# Patient Record
Sex: Male | Born: 1955
Health system: Southern US, Community
[De-identification: ages and names within clinical notes are randomized; demographics above are authoritative.]

## PROBLEM LIST (undated history)

## (undated) DIAGNOSIS — Z87442 Personal history of urinary calculi: Secondary | ICD-10-CM

## (undated) DIAGNOSIS — N200 Calculus of kidney: Secondary | ICD-10-CM

## (undated) DIAGNOSIS — K219 Gastro-esophageal reflux disease without esophagitis: Secondary | ICD-10-CM

## (undated) DIAGNOSIS — H43819 Vitreous degeneration, unspecified eye: Secondary | ICD-10-CM

## (undated) DIAGNOSIS — R002 Palpitations: Secondary | ICD-10-CM

## (undated) DIAGNOSIS — C4491 Basal cell carcinoma of skin, unspecified: Secondary | ICD-10-CM

## (undated) DIAGNOSIS — K635 Polyp of colon: Secondary | ICD-10-CM

## (undated) HISTORY — DX: Basal cell carcinoma of skin, unspecified: C44.91

## (undated) HISTORY — DX: Polyp of colon: K63.5

## (undated) HISTORY — PX: POLYPECTOMY: SHX149

## (undated) HISTORY — DX: Palpitations: R00.2

## (undated) HISTORY — DX: Calculus of kidney: N20.0

## (undated) HISTORY — DX: Vitreous degeneration, unspecified eye: H43.819

---
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
## 2000-10-03 HISTORY — PX: KNEE ARTHROSCOPY: SUR90

## 2006-11-24 ENCOUNTER — Encounter: Payer: Self-pay | Admitting: Internal Medicine

## 2006-12-12 ENCOUNTER — Encounter: Payer: Self-pay | Admitting: Internal Medicine

## 2006-12-25 ENCOUNTER — Encounter: Payer: Self-pay | Admitting: Internal Medicine

## 2006-12-29 ENCOUNTER — Encounter: Payer: Self-pay | Admitting: Internal Medicine

## 2007-01-02 ENCOUNTER — Encounter: Payer: Self-pay | Admitting: Internal Medicine

## 2008-04-22 ENCOUNTER — Encounter: Payer: Self-pay | Admitting: Internal Medicine

## 2008-04-22 LAB — CONVERTED CEMR LAB
Albumin: 4.5 g/dL
CO2, serum: 26 mmol/L
Calcium: 9.6 mg/dL
GGT: 23 units/L
Globulin: 2.2 g/dL
LDL Cholesterol: 105 mg/dL
PSA: 0.9 ng/mL
Platelets: 225 10*3/uL
Sodium, serum: 140 mmol/L
Total CK: 3 units/L
Total Protein: 6.7 g/dL
Triglycerides: 54 mg/dL
WBC, blood: 5.1 10*3/uL

## 2008-04-25 ENCOUNTER — Encounter: Payer: Self-pay | Admitting: Internal Medicine

## 2008-05-07 ENCOUNTER — Encounter: Payer: Self-pay | Admitting: Internal Medicine

## 2008-05-07 ENCOUNTER — Encounter: Payer: Self-pay | Admitting: Gastroenterology

## 2008-09-10 ENCOUNTER — Encounter: Payer: Self-pay | Admitting: Internal Medicine

## 2008-09-10 LAB — CONVERTED CEMR LAB: Homocysteine: 9.5 micromoles/L

## 2009-01-29 ENCOUNTER — Emergency Department (HOSPITAL_BASED_OUTPATIENT_CLINIC_OR_DEPARTMENT_OTHER): Admission: EM | Admit: 2009-01-29 | Discharge: 2009-01-29 | Payer: Self-pay | Admitting: Emergency Medicine

## 2009-01-29 ENCOUNTER — Encounter: Payer: Self-pay | Admitting: Internal Medicine

## 2009-01-29 ENCOUNTER — Ambulatory Visit: Payer: Self-pay | Admitting: Diagnostic Radiology

## 2009-01-29 LAB — CONVERTED CEMR LAB
BUN: 19 mg/dL
Calcium: 9.1 mg/dL
Hemoglobin: 14.4 g/dL
Platelets: 210 10*3/uL
RBC count: 4.56 10*6/uL
Sodium, serum: 142 mmol/L
WBC, blood: 8.7 10*3/uL

## 2009-02-17 ENCOUNTER — Ambulatory Visit: Payer: Self-pay | Admitting: Internal Medicine

## 2009-02-17 DIAGNOSIS — F411 Generalized anxiety disorder: Secondary | ICD-10-CM | POA: Insufficient documentation

## 2009-02-17 DIAGNOSIS — R197 Diarrhea, unspecified: Secondary | ICD-10-CM | POA: Insufficient documentation

## 2009-02-21 ENCOUNTER — Encounter: Payer: Self-pay | Admitting: Internal Medicine

## 2009-04-07 ENCOUNTER — Ambulatory Visit: Payer: Self-pay | Admitting: Gastroenterology

## 2009-04-16 ENCOUNTER — Encounter: Payer: Self-pay | Admitting: Internal Medicine

## 2009-04-22 ENCOUNTER — Ambulatory Visit: Payer: Self-pay | Admitting: Gastroenterology

## 2009-04-24 LAB — CONVERTED CEMR LAB: Tissue Transglutaminase Ab, IgA: 0.3 units (ref <7)

## 2009-04-26 ENCOUNTER — Telehealth: Payer: Self-pay | Admitting: Internal Medicine

## 2009-05-19 ENCOUNTER — Ambulatory Visit: Payer: Self-pay | Admitting: Gastroenterology

## 2009-05-25 ENCOUNTER — Ambulatory Visit: Payer: Self-pay | Admitting: Cardiovascular Disease

## 2009-06-18 ENCOUNTER — Telehealth (INDEPENDENT_AMBULATORY_CARE_PROVIDER_SITE_OTHER): Payer: Self-pay | Admitting: *Deleted

## 2010-04-09 ENCOUNTER — Ambulatory Visit: Payer: Self-pay | Admitting: Internal Medicine

## 2010-04-09 DIAGNOSIS — R634 Abnormal weight loss: Secondary | ICD-10-CM | POA: Insufficient documentation

## 2010-04-09 LAB — CONVERTED CEMR LAB: Blood Glucose, Fingerstick: 103

## 2010-04-15 ENCOUNTER — Ambulatory Visit: Payer: Self-pay | Admitting: Internal Medicine

## 2010-05-03 DIAGNOSIS — H43819 Vitreous degeneration, unspecified eye: Secondary | ICD-10-CM

## 2010-05-03 HISTORY — DX: Vitreous degeneration, unspecified eye: H43.819

## 2010-05-03 LAB — CONVERTED CEMR LAB
ALT: 18 units/L (ref 0–53)
AST: 20 units/L (ref 0–37)
BUN: 18 mg/dL (ref 6–23)
Basophils Absolute: 0 10*3/uL (ref 0.0–0.1)
Basophils Relative: 0.8 % (ref 0.0–3.0)
Chloride: 104 meq/L (ref 96–112)
Folate: 18.7 ng/mL
Glucose, Bld: 74 mg/dL (ref 70–99)
Hemoglobin: 14.9 g/dL (ref 13.0–17.0)
Lymphocytes Relative: 34.3 % (ref 12.0–46.0)
Monocytes Relative: 8.2 % (ref 3.0–12.0)
Neutro Abs: 3.3 10*3/uL (ref 1.4–7.7)
Potassium: 4.5 meq/L (ref 3.5–5.1)
RBC: 4.67 M/uL (ref 4.22–5.81)

## 2010-05-04 ENCOUNTER — Ambulatory Visit: Payer: Self-pay | Admitting: Internal Medicine

## 2010-05-10 LAB — CONVERTED CEMR LAB: PSA: 0.99 ng/mL (ref 0.10–4.00)

## 2010-05-23 IMAGING — CR DG CHEST 2V
2 series · 2 of 2 positions shown · non-contrast
Comparison: None

CLINICAL DATA: Palpitations, dizziness

CHEST - 2 VIEW

[w chest pa]
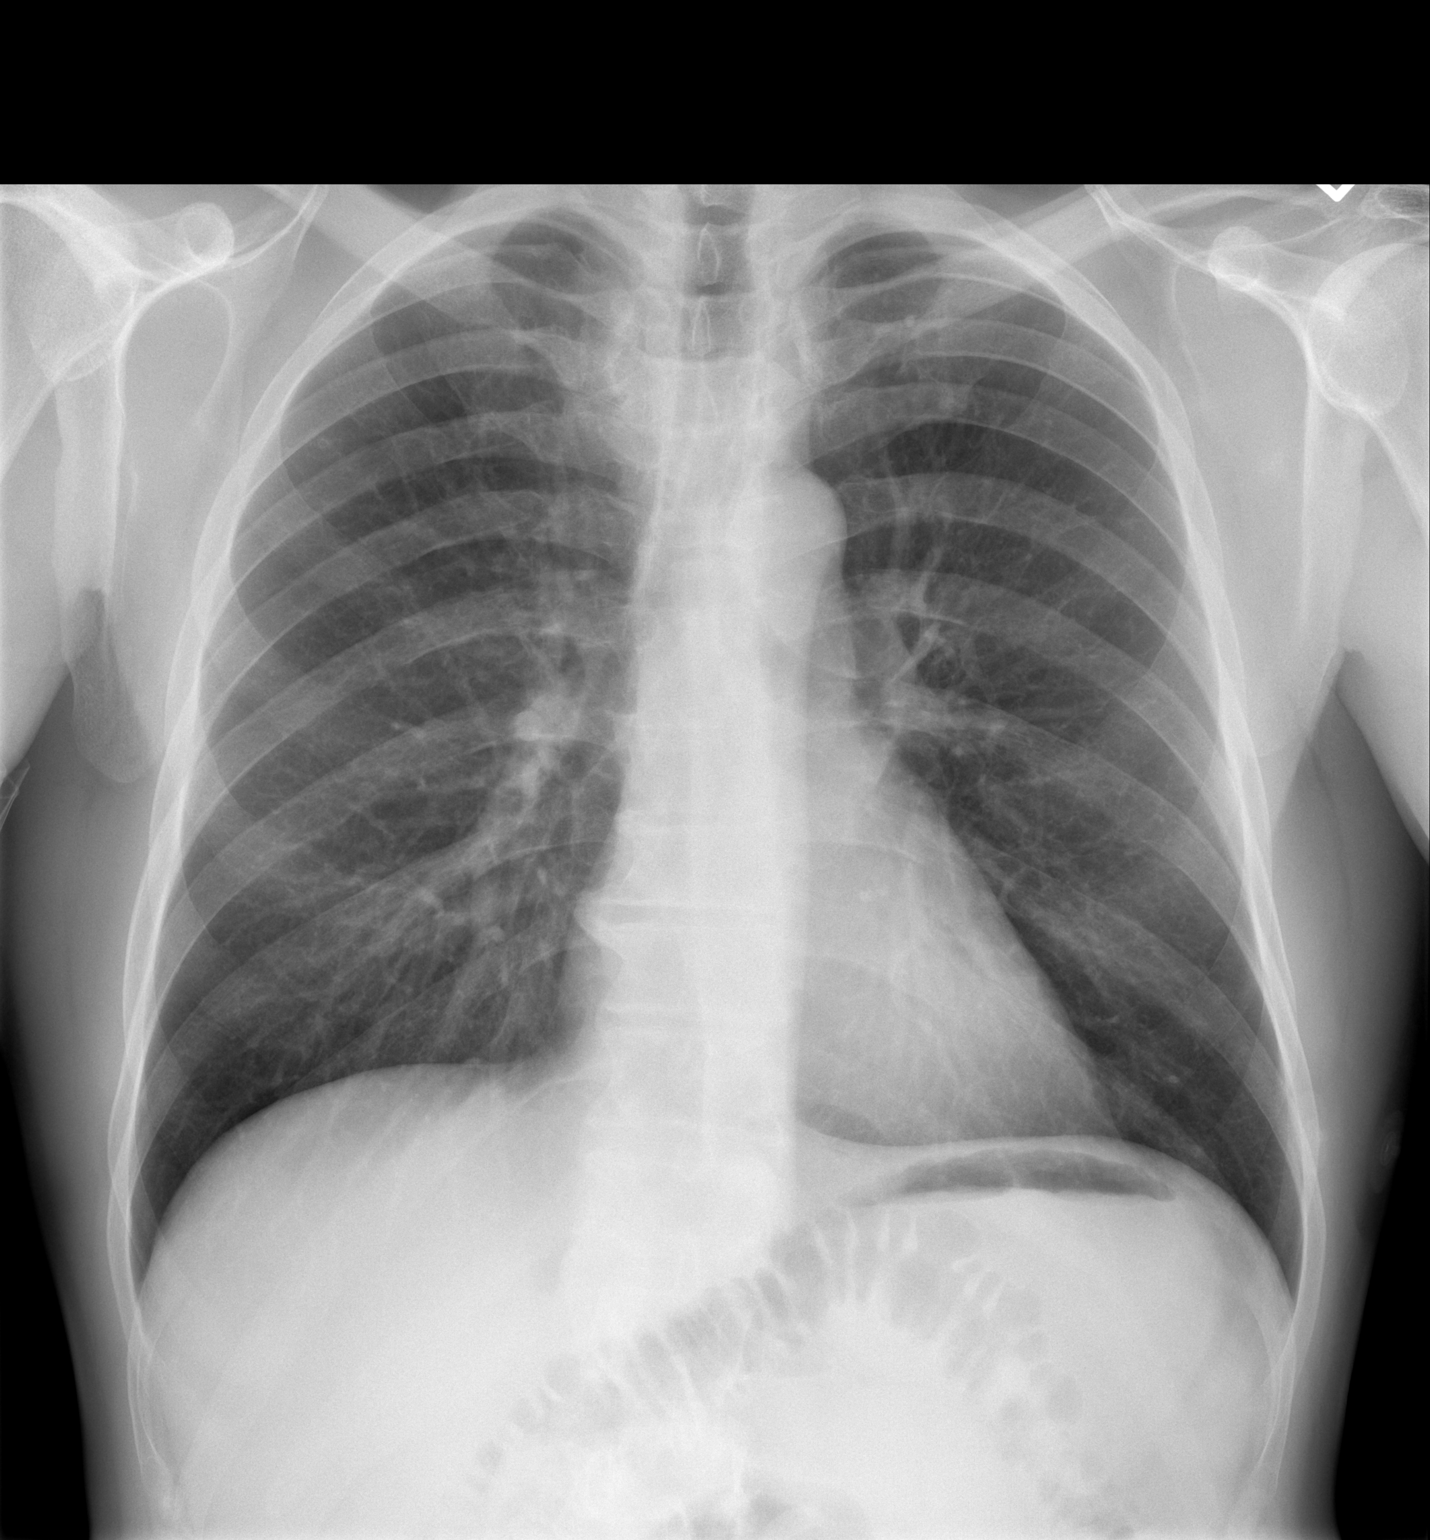

[w chest lat]
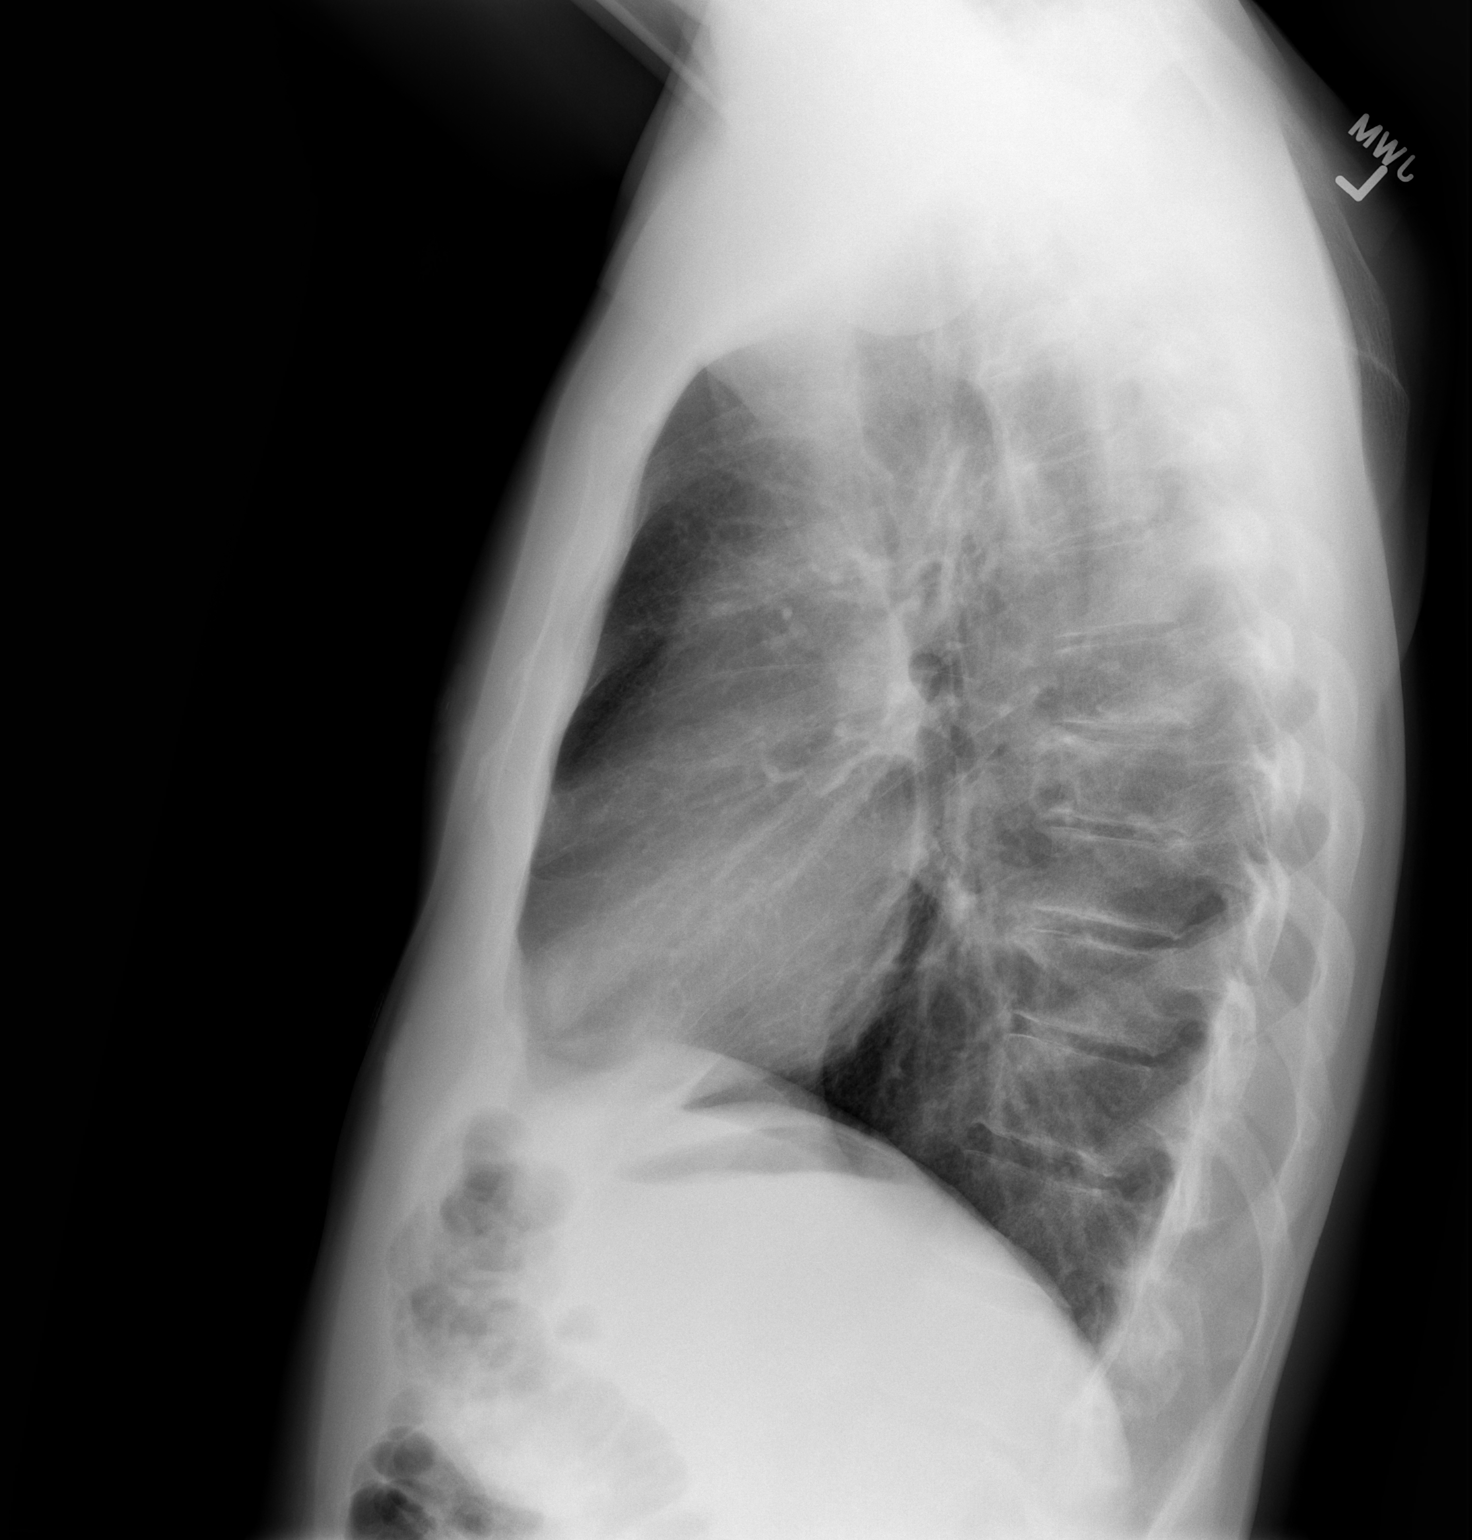

[2 of 2 positions shown; findings below may reference images not displayed]

FINDINGS: Normal heart size, mediastinal contours, and pulmonary vascularity.
Lungs clear.
Bones unremarkable.
No pneumothorax.
IMPRESSION: No acute abnormalities.

## 2010-08-23 ENCOUNTER — Ambulatory Visit: Payer: Self-pay | Admitting: Internal Medicine

## 2010-08-31 ENCOUNTER — Ambulatory Visit: Payer: Self-pay | Admitting: Internal Medicine

## 2010-09-03 LAB — CONVERTED CEMR LAB: HDL: 66.7 mg/dL (ref >39.00)

## 2010-11-02 NOTE — Assessment & Plan Note (Signed)
Summary: CPX//PH   Vital Signs:  Patient profile:   55 year old male Height:      70 inches Weight:      159.13 pounds Pulse rate:   69 / minute Pulse rhythm:   regular BP sitting:   124 / 76  (left arm) Cuff size:   regular  Vitals Entered By: Army Fossa CMA (August 23, 2010 2:21 PM) CC: CPX, not fasting  Comments no complaints [facility]   History of Present Illness: complete physical exam  Preventive Screening-Counseling & Management  Alcohol-Tobacco     Alcohol type: occasionally     Smoking Status: never  Current Medications (verified): 1)  Lamisil 250 Mg Tabs (Terbinafine Hcl) .... Per Podiatrist  Allergies (verified): No Known Drug Allergies  Past History:  Past Medical History: Palpitations, 2010, saw [facility] Cardiology (w/u per patient negative) Anxiety   Vitreous detachment, dx  ~ 05-2010   Past Surgical History: Reviewed history from 04/07/2009 and no changes required. 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 CV:  Denies chest pain or discomfort, palpitations, and swelling of feet. Resp:  Denies cough and shortness of breath. GI:  Denies bloody stools, nausea, and vomiting; still loose stools on-off . GU:  Denies dysuria, hematuria, urinary frequency, and urinary hesitancy.  Physical Exam  General:  alert, well-developed, and well-nourished.   Neck:  no masses and no thyromegaly.   Lungs:  normal respiratory effort, no intercostal retractions, no accessory muscle use, and normal breath sounds.   Heart:  normal rate, regular  rhythm, no murmur, and no gallop.   Abdomen:  soft, non-tender, no distention, no masses, no guarding, and no rigidity.   Rectal:  No external abnormalities noted. Normal sphincter tone. No rectal masses or tenderness. Prostate:  Prostate gland firm and smooth, no nodularity, tenderness, mass, asymmetry or induration.  prostate is slightly  enlarged  Extremities:  no pretibial edema bilaterally  Psych:  Oriented X3, memory intact for recent and remote, normally interactive, good eye contact, not anxious appearing, and not depressed appearing.     Impression & Recommendations:  Problem # 1:  ROUTINE GENERAL MEDICAL EXAM@HEALTH  CARE FACL (ICD-V70.0) Td 06 Had a flu shot already   [place] (random biopsies were normal, terminal ileum was normal appearing and normal by biopsy, 10mm pedunculated TVA was removed from rectum). recall colonoscopy 05/2011  doing great, continue his healthy life style labs, see instructions   Complete Medication List: 1)  Lamisil 250 Mg Tabs (Terbinafine hcl) .... Per podiatrist  Patient Instructions: 1)  please came back fasting for a FLP, dx V70 2)  Please schedule a follow-up appointment in 1 year.    Orders Added: 1)  Est. Patient age 85-64 [[id number]]   Immunization History:  Influenza Immunization History:    Influenza:  historical (07/23/2010)   Immunization History:  Influenza Immunization History:    Influenza:  Historical (07/23/2010)    Risk Factors:  Tobacco use:  never Alcohol use:  yes    Type:  occasionally

## 2010-11-02 NOTE — Assessment & Plan Note (Signed)
Summary: QUESTION ABOUT WEIGHT LOSS/KN--Rm  11   Vital Signs:  Patient profile:   55 year old male Height:      70 inches Weight:      156.25 pounds BMI:     22.50 Temp:     97.2 degrees F oral Pulse rate:   66 / minute Pulse rhythm:   regular Resp:     12 per minute BP sitting:   110 / 82  (left arm) Cuff size:   regular  Vitals Entered By: Mervin Kung CMA Duncan Dull) (April 09, 2010 4:11 PM) CC: Room 11   Pt concerned about losing weight without trying to. Has lost 10 pounds over 1 year. Wants PSA checked.  Intermittent cloudy vision x 6 months. Is Patient Diabetic? Yes CBG Result 103  Comments Pt no longer takes Immodium.   History of Present Illness: gradual, 10 pound weight loss over the last 6-10 months  The patient is eating about the same, is very active but his activity level has no increased He feels well  ROS Has chronic loose stools without blood Denies nausea, vomiting, abdominal pain Occasionally has blurred vision not particularly thirsty, his appetite is sometimes increased Would like his PSA check Denies dysuria, difficulty urinating or gross hematuria denies anxiety   Allergies (verified): No Known Drug Allergies  Past History:  Past Medical History: Reviewed history from 04/07/2009 and no changes required. Palpitations, 2010, saw [facility] Cardiology (w/u per patient negative) Anxiety    Past Surgical History: Reviewed history from 04/07/2009 and no changes required. 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  healthy-appearing 55 year old gentleman Neck:  no masses and no thyromegaly.   Lungs:  normal respiratory effort, no intercostal retractions, no accessory muscle use, and normal breath sounds.   Heart:  normal rate,  regular rhythm, no murmur, and no gallop.   Abdomen:  soft, non-tender, no distention, no masses, no guarding, and no rigidity.   Extremities:  no lower extremity edema   Impression & Recommendations:  Problem # 1:  WEIGHT LOSS (ICD-783.21) the patient has documented weight loss, review of systems is essentially negative, he feels well and appears healthy. CBG  normal his father is a thin but healthy person Plan: Labs  Orders: Fingerstick ([id number])  Problem # 2:  SPECIAL SCREENING MALIGNANT NEOPLASM OF PROSTATE (ICD-V76.44) check a PSA  Complete Medication List: 1)  Imodium A-d 2 Mg Tabs (Loperamide hcl) .... As needed  Patient Instructions: 1)  please came back next week, not fasting: 2)  CBC TSH BMP B12-folic acid AST ALT---dx weight loss 3)  PSA --dx prostate ca screening  4)  came back in 4 months for a physical  Current Allergies (reviewed today): No known allergies  Laboratory Results   Blood Tests   Date/Time Received: 04/09/10 Date/Time Reported: Mervin Kung CMA (AAMA)  April 09, 2010 4:50 PM   CBG Random:: 103 mg/dL

## 2011-01-12 LAB — CBC
HCT: 42.5 % (ref 39.0–52.0)
Hemoglobin: 14.4 g/dL (ref 13.0–17.0)
MCHC: 34 g/dL (ref 30.0–36.0)
MCV: 93.2 fL (ref 78.0–100.0)
RBC: 4.56 MIL/uL (ref 4.22–5.81)

## 2011-01-12 LAB — DIFFERENTIAL
Basophils Relative: 1 % (ref 0–1)
Eosinophils Absolute: 0.1 10*3/uL (ref 0.0–0.7)
Eosinophils Relative: 1 % (ref 0–5)
Lymphs Abs: 2.9 10*3/uL (ref 0.7–4.0)
Monocytes Absolute: 0.6 10*3/uL (ref 0.1–1.0)
Monocytes Relative: 7 % (ref 3–12)
Neutrophils Relative %: 58 % (ref 43–77)

## 2011-01-12 LAB — BASIC METABOLIC PANEL
CO2: 28 mEq/L (ref 19–32)
Chloride: 103 mEq/L (ref 96–112)
Creatinine, Ser: 1.3 mg/dL (ref 0.4–1.5)
GFR calc Af Amer: >60 mL/min (ref >60)
Potassium: 3.7 mEq/L (ref 3.5–5.1)

## 2011-05-20 ENCOUNTER — Encounter: Payer: Self-pay | Admitting: Gastroenterology

## 2011-06-01 ENCOUNTER — Encounter: Payer: Self-pay | Admitting: Gastroenterology

## 2011-07-01 ENCOUNTER — Encounter: Payer: Self-pay | Admitting: Gastroenterology

## 2011-07-01 ENCOUNTER — Ambulatory Visit (AMBULATORY_SURGERY_CENTER): Payer: BC Managed Care – PPO | Admitting: *Deleted

## 2011-07-01 VITALS — Ht 71.0 in | Wt 162.3 lb

## 2011-07-01 DIAGNOSIS — Z1211 Encounter for screening for malignant neoplasm of colon: Secondary | ICD-10-CM

## 2011-07-01 MED ORDER — PEG-KCL-NACL-NASULF-NA ASC-C 100 G PO SOLR: ORAL | Status: DC

## 2011-07-15 ENCOUNTER — Encounter: Payer: Self-pay | Admitting: Gastroenterology

## 2011-07-15 ENCOUNTER — Ambulatory Visit (AMBULATORY_SURGERY_CENTER): Payer: BC Managed Care – PPO | Admitting: Gastroenterology

## 2011-07-15 VITALS — BP 134/73 | HR 62 | Temp 97.8°F | Resp 16 | Ht 71.0 in | Wt 162.0 lb

## 2011-07-15 DIAGNOSIS — D128 Benign neoplasm of rectum: Secondary | ICD-10-CM

## 2011-07-15 DIAGNOSIS — K5730 Diverticulosis of large intestine without perforation or abscess without bleeding: Secondary | ICD-10-CM

## 2011-07-15 DIAGNOSIS — Z86010 Personal history of colonic polyps: Secondary | ICD-10-CM

## 2011-07-15 DIAGNOSIS — D129 Benign neoplasm of anus and anal canal: Secondary | ICD-10-CM

## 2011-07-15 DIAGNOSIS — K635 Polyp of colon: Secondary | ICD-10-CM

## 2011-07-15 DIAGNOSIS — Z1211 Encounter for screening for malignant neoplasm of colon: Secondary | ICD-10-CM

## 2011-07-15 DIAGNOSIS — D126 Benign neoplasm of colon, unspecified: Secondary | ICD-10-CM

## 2011-07-15 MED ORDER — SODIUM CHLORIDE 0.9 % IV SOLN: 500.0000 mL | INTRAVENOUS | Status: DC

## 2011-07-15 NOTE — Patient Instructions (Signed)
Please refer to blue and green discharge instruction sheet 

## 2011-07-18 ENCOUNTER — Telehealth: Payer: Self-pay | Admitting: *Deleted

## 2011-07-18 NOTE — Telephone Encounter (Signed)
Identifier on phone, message left to call if questions or concerns from procedure.

## 2011-07-27 ENCOUNTER — Ambulatory Visit (INDEPENDENT_AMBULATORY_CARE_PROVIDER_SITE_OTHER): Payer: BC Managed Care – PPO | Admitting: Ophthalmology

## 2011-07-27 DIAGNOSIS — H43819 Vitreous degeneration, unspecified eye: Secondary | ICD-10-CM

## 2011-07-27 DIAGNOSIS — H2510 Age-related nuclear cataract, unspecified eye: Secondary | ICD-10-CM

## 2011-07-27 DIAGNOSIS — H33309 Unspecified retinal break, unspecified eye: Secondary | ICD-10-CM

## 2011-09-29 ENCOUNTER — Encounter: Payer: Self-pay | Admitting: Internal Medicine

## 2011-09-29 ENCOUNTER — Ambulatory Visit (INDEPENDENT_AMBULATORY_CARE_PROVIDER_SITE_OTHER): Payer: BC Managed Care – PPO | Admitting: Internal Medicine

## 2011-09-29 DIAGNOSIS — J111 Influenza due to unidentified influenza virus with other respiratory manifestations: Secondary | ICD-10-CM

## 2011-09-29 DIAGNOSIS — R52 Pain, unspecified: Secondary | ICD-10-CM

## 2011-09-29 LAB — POCT INFLUENZA A/B: Influenza B, POC: NEGATIVE

## 2011-09-29 MED ORDER — OSELTAMIVIR PHOSPHATE 75 MG PO CAPS: 75.0000 mg | ORAL_CAPSULE | Freq: Two times a day (BID) | ORAL | Status: AC

## 2011-09-29 NOTE — Progress Notes (Signed)
  Subjective:    Patient ID: Bradley Meyer, male    DOB: Sep 24, 1956, 55 y.o.   MRN: [id number]  HPI Acute visit. 2 weeks history of mild cough, fatigue and sinus congestion. He was actually feeling very well up until yesterday when all the symptoms came back and today is "the worst day". He had fever for the first time today as well. Has not taken any meds for current sx   Also has a skin lesion for the last few months at the right hip  Past Medical History: Palpitations, 2010, saw [facility] (w/u per patient negative) Anxiety   Vitreous detachment, dx ~ 05-2010  BCC nose 2006  Past Surgical History: Reviewed history from 04/07/2009 and no changes required. 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 No distress.       Nontoxic  HENT:  Head: Normocephalic and atraumatic.  Right Ear: External ear normal.  Left Ear: External ear normal.       Face symmetric, nontender to palpation, throat without redness  Cardiovascular: Normal rate and regular rhythm.   No murmur heard. Pulmonary/Chest: Effort normal and breath sounds normal. No respiratory distress. He has no wheezes. He has no rales.  Skin: He is not diaphoretic.       At the right hip has a 3 mm skin lesion, slightly darker than the skin and w/ a rough texture       Assessment & Plan:  Respiratory symptoms for 2 weeks but acutely worse for the last 2 days. Fever for the  first time today, flu test positive. Most likely he had a URI (that was resolving) and now has flu in the last 2 days. See instructions. If he's not better, he will need antibiotics for possible sinusitis  Skin lesion: Recommend to schedule an appointment for excision

## 2011-09-29 NOTE — Patient Instructions (Signed)
Rest, fluids , tylenol For cough, take Mucinex DM or dayquil  as needed  Take tamiflu x 5 days Call if no better in few days Call anytime if the symptoms are severe call if the sinus pressure and discharge continue Schedule a office visit for the excision of the skin lesion at the R hip

## 2011-09-30 ENCOUNTER — Encounter: Payer: Self-pay | Admitting: Internal Medicine

## 2012-03-29 ENCOUNTER — Encounter: Payer: Self-pay | Admitting: Internal Medicine

## 2012-03-29 ENCOUNTER — Ambulatory Visit (INDEPENDENT_AMBULATORY_CARE_PROVIDER_SITE_OTHER): Payer: 59 | Admitting: Internal Medicine

## 2012-03-29 VITALS — BP 118/80 | HR 62 | Temp 98.0°F | Wt 164.0 lb

## 2012-03-29 DIAGNOSIS — L723 Sebaceous cyst: Secondary | ICD-10-CM

## 2012-03-29 NOTE — Progress Notes (Signed)
  Subjective:    Patient ID: Bradley Meyer, male    DOB: [birth year], 56 y.o.   MRN: [id number]  HPI Acute visit Has a bump at the forehead for a long time, it has increased in size lately and causing some discomfort   Past Medical History: Palpitations, 2010, saw [facility] Cardiology (w/u per patient negative) Anxiety    Vitreous detachment, dx ~ 05-2010   BCC nose 2006  Past Surgical History: Reviewed history from 04/07/2009 and no changes required. 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 No distress.  HENT:  Head:    Skin: He is not diaphoretic.  Psychiatric: He has a normal mood and affect. His behavior is normal. Judgment and thought content normal.       Assessment & Plan:

## 2012-03-29 NOTE — Patient Instructions (Addendum)
Will refer you to surgery Schedule your physical at your covenience

## 2012-03-29 NOTE — Assessment & Plan Note (Signed)
Sebaceous cyst?. Lesion is growing and causing some discomfort, refer to Gen. surgery for consideration of excision.

## 2012-07-27 ENCOUNTER — Encounter (INDEPENDENT_AMBULATORY_CARE_PROVIDER_SITE_OTHER): Payer: 59 | Admitting: Ophthalmology

## 2012-07-27 DIAGNOSIS — H43819 Vitreous degeneration, unspecified eye: Secondary | ICD-10-CM

## 2012-07-27 DIAGNOSIS — H2510 Age-related nuclear cataract, unspecified eye: Secondary | ICD-10-CM

## 2012-07-27 DIAGNOSIS — H33309 Unspecified retinal break, unspecified eye: Secondary | ICD-10-CM

## 2013-02-08 ENCOUNTER — Encounter: Payer: Self-pay | Admitting: Internal Medicine

## 2013-02-08 ENCOUNTER — Ambulatory Visit (INDEPENDENT_AMBULATORY_CARE_PROVIDER_SITE_OTHER): Payer: 59 | Admitting: Internal Medicine

## 2013-02-08 VITALS — BP 108/78 | HR 66 | Temp 98.1°F | Ht 71.0 in | Wt 169.0 lb

## 2013-02-08 DIAGNOSIS — L723 Sebaceous cyst: Secondary | ICD-10-CM

## 2013-02-08 DIAGNOSIS — Z0000 Encounter for general adult medical examination without abnormal findings: Secondary | ICD-10-CM

## 2013-02-08 LAB — COMPREHENSIVE METABOLIC PANEL
ALT: 17 U/L (ref 0–53)
AST: 20 U/L (ref 0–37)
Albumin: 4.1 g/dL (ref 3.5–5.2)
Alkaline Phosphatase: 76 U/L (ref 39–117)
Glucose, Bld: 90 mg/dL (ref 70–99)
Potassium: 4.3 mEq/L (ref 3.5–5.1)
Sodium: 139 mEq/L (ref 135–145)
Total Bilirubin: 0.9 mg/dL (ref 0.3–1.2)
Total Protein: 6.4 g/dL (ref 6.0–8.3)

## 2013-02-08 LAB — CBC WITH DIFFERENTIAL/PLATELET
Basophils Absolute: 0 10*3/uL (ref 0.0–0.1)
Eosinophils Absolute: 0.1 10*3/uL (ref 0.0–0.7)
HCT: 41.4 % (ref 39.0–52.0)
Lymphs Abs: 1.6 10*3/uL (ref 0.7–4.0)
MCHC: 34.2 g/dL (ref 30.0–36.0)
MCV: 91.9 fl (ref 78.0–100.0)
Monocytes Absolute: 0.6 10*3/uL (ref 0.1–1.0)
Neutrophils Relative %: 58 % (ref 43.0–77.0)
Platelets: 188 10*3/uL (ref 150.0–400.0)
RDW: 12.6 % (ref 11.5–14.6)
WBC: 5.5 10*3/uL (ref 4.5–10.5)

## 2013-02-08 LAB — LIPID PANEL
Cholesterol: 172 mg/dL (ref 0–200)
LDL Cholesterol: 98 mg/dL (ref 0–99)
Triglycerides: 54 mg/dL (ref 0.0–149.0)

## 2013-02-08 LAB — SEDIMENTATION RATE: Sed Rate: 4 mm/hr (ref 0–22)

## 2013-02-08 LAB — PSA: PSA: 1.45 ng/mL (ref 0.10–4.00)

## 2013-02-08 NOTE — Assessment & Plan Note (Signed)
S/p excision

## 2013-02-08 NOTE — Assessment & Plan Note (Addendum)
Td 06 zostavax discussed   05/[place]: (random biopsies were normal, terminal ileum was normal appearing and normal by biopsy, 10mm pedunculated TVA was removed from rectum).  colonoscopy 07/2011--- next 5 years Joint stiffnes w/o sinovitis on exam: check a sed rate, stay active, stretch. Decreases stamina, no cardiovascular symptoms, will check general labs, if decreased his stamina continue recommend to come back in 6 months continue his healthy life style Labs

## 2013-02-08 NOTE — Progress Notes (Signed)
  Subjective:    Patient ID: Bradley Meyer, male    DOB: [birth year], 57 y.o.   MRN: [id number]  HPI CPX  Past Medical History:  Palpitations, 2010, saw [facility] Cardiology (w/u per patient negative)  Anxiety  Vitreous detachment, dx ~ 05-2010  BCC nose 2006   Past Surgical History:  Appendectomy  torn meniscus (rt knee)  skin ca removed from nose 2006   Family History:  CAD - no  HTN - sis  DM - no  stroke - GF  colon Ca - no  prostate Ca - F (dx age 65s)   Social History:  moved here from [place] 10-2008  Occupation: Acupuncturist  Married , no children  tobacco-- quit 2009  ETOH -- socially  exercise-- plays tennis, hicking    Review of Systems Reports occasional joint stiffness  mostly in the mornings, symptoms go away after his daily shower, also some decrease in his stamina. No fever, chills, weight loss. No rash. No decrease in sex drive  No chest pain, shortness or breath, lower extremity edema or dyspnea on exertion. Some runny nose and occasional cough in the morning but no itchy eyes or nose. No dysuria or gross hematuria. No nausea, vomiting, blood in the stools. Occasional diarrhea in the mornings which is a long-term problem.    Objective:   Physical Exam BP 108/78  Pulse 66  Temp(Src) 98.1 F (36.7 C) (Oral)  Ht 5\' 11"  (1.803 m)  Wt 169 lb (76.658 kg)  BMI 23.58 kg/m2  SpO2 98%  General -- alert, well-developed, NAD   Neck --no thyromegaly , normal carotid pulse Lungs -- normal respiratory effort, no intercostal retractions, no accessory muscle use, and normal breath sounds.   Heart-- normal rate, regular rhythm, no murmur, and no gallop.   Abdomen--soft, non-tender, no distention, no masses, no HSM, no guarding, and no rigidity.   Extremities-- no pretibial edema bilaterally Rectal-- No external abnormalities noted. Normal sphincter tone. No rectal masses or tenderness. No stool found Prostate:  Prostate gland firm and smooth, no enlargement,  nodularity, tenderness, mass, asymmetry or induration. Neurologic-- alert & oriented X3 and strength normal in all extremities. Psych-- Cognition and judgment appear intact. Alert and cooperative with normal attention span and concentration.  not anxious appearing and not depressed appearing.       Assessment & Plan:

## 2013-07-29 ENCOUNTER — Ambulatory Visit (INDEPENDENT_AMBULATORY_CARE_PROVIDER_SITE_OTHER): Payer: Medicare Other | Admitting: Ophthalmology

## 2013-07-29 DIAGNOSIS — H33309 Unspecified retinal break, unspecified eye: Secondary | ICD-10-CM

## 2013-07-29 DIAGNOSIS — H43819 Vitreous degeneration, unspecified eye: Secondary | ICD-10-CM

## 2013-07-29 DIAGNOSIS — H2510 Age-related nuclear cataract, unspecified eye: Secondary | ICD-10-CM

## 2013-08-08 ENCOUNTER — Other Ambulatory Visit: Payer: Self-pay

## 2014-01-01 ENCOUNTER — Ambulatory Visit (HOSPITAL_BASED_OUTPATIENT_CLINIC_OR_DEPARTMENT_OTHER)
Admission: RE | Admit: 2014-01-01 | Discharge: 2014-01-01 | Disposition: A | Discharge status: Home / Self Care | Payer: 59 | Source: Ambulatory Visit | Attending: Internal Medicine | Admitting: Internal Medicine

## 2014-01-01 ENCOUNTER — Encounter: Payer: Self-pay | Admitting: Internal Medicine

## 2014-01-01 ENCOUNTER — Ambulatory Visit (INDEPENDENT_AMBULATORY_CARE_PROVIDER_SITE_OTHER): Payer: 59 | Admitting: Internal Medicine

## 2014-01-01 VITALS — BP 112/65 | HR 65 | Temp 98.2°F | Wt 156.0 lb

## 2014-01-01 DIAGNOSIS — R079 Chest pain, unspecified: Secondary | ICD-10-CM | POA: Insufficient documentation

## 2014-01-01 DIAGNOSIS — R634 Abnormal weight loss: Secondary | ICD-10-CM | POA: Insufficient documentation

## 2014-01-01 DIAGNOSIS — R05 Cough: Secondary | ICD-10-CM

## 2014-01-01 DIAGNOSIS — R059 Cough, unspecified: Secondary | ICD-10-CM

## 2014-01-01 MED ORDER — FLUTICASONE PROPIONATE 50 MCG/ACT NA SUSP: 2.0000 | Freq: Every day | NASAL | Status: DC

## 2014-01-01 NOTE — Progress Notes (Signed)
   Subjective:    Patient ID: Bradley Meyer, male    DOB: [birth year], 58 y.o.   MRN: [id number]  DOS:  01/01/2014 Type of  visit: Acute visit ,  Chronic morning cough, worse in the last 2 months: Cough is persisting, gags w/ mucus, needs to spit it , Needs to clear his throat, abundant runny nose. Symptoms are better during the rest of today. No recent URI. Also " for a while" has a tightness and the anterior upper chest, no radiation, no clear relationship  with eating, cough or exertion.   ROS No fever chills but has noted some weight loss. Mild sinus congestion on and off. + Postnasal dripping throughout the day. Denies sneezing, itchy eyes or itchy nose. No GERD-type symptoms, no wheezing He remains active and plays tennis without chest pain. Mild dyspnea on exertion he believes due to to deconditioning.   Past Medical History:   Palpitations, 2010, saw [facility] Cardiology (w/u per patient negative)   Anxiety   Vitreous detachment, dx ~ 05-2010   BCC nose 2006   Past Surgical History:   Appendectomy   torn meniscus (rt knee)   skin ca removed from nose 2006   Family History:   CAD - no   HTN - sis   DM - no   stroke - GF   colon Ca - no   prostate Ca - F (dx age 66s)   Social History:   moved here from [place]   Occupation: Art gallery manager   Married , no children   tobacco-- quit 2009   ETOH -- socially          Medication List       This list is accurate as of: 01/01/14  6:27 PM.  Always use your most recent med list.               fluticasone 50 MCG/ACT nasal spray  Commonly known as:  FLONASE  Place 2 sprays into both nostrils daily.           Objective:   Physical Exam BP 112/65  Pulse 65  Temp(Src) 98.2 F (36.8 C)  Wt 156 lb (70.761 kg)  SpO2 100% General -- alert, well-developed, NAD.  Neck --no thyromegaly , no mass or  LAD HEENT-- Not pale. TMs normal, throat symmetric, no redness or discharge. Face symmetric, sinuses not  tender to palpation. Nose slt  congested. Lungs -- normal respiratory effort, no intercostal retractions, no accessory muscle use, and normal breath sounds.  Heart-- normal rate, regular rhythm, no murmur.  Abdomen-- Not distended, good bowel sounds,soft, non-tender.  Extremities-- no pretibial edema bilaterally  Neurologic--  alert & oriented X3. Speech normal, gait normal, strength normal in all extremities.  Psych-- Cognition and judgment appear intact. Cooperative with normal attention span and concentration. No anxious or depressed appearing.      Assessment & Plan:  Cough, Persisting cough in the setting of postnasal drip and "mucus" accumulation in the throat mostly in the morning. Plan:  Suspect allergies, prescribe Flonase. Also trial with omeprazole.  Chest pain, Atypical, EKG today nsr, no acute  He also has weight loss, for completeness we'll do a chest x-ray  Weight loss, All labs last year were within normal, he is due for a physical exam in 1 month, we'll recheck at that time

## 2014-01-01 NOTE — Progress Notes (Signed)
Pre visit review using our clinic review tool, if applicable. No additional management support is needed unless otherwise documented below in the visit note. 

## 2014-01-01 NOTE — Patient Instructions (Signed)
Get the XR at [facility], corner of [address] and [address] (10 minutes form here); they are open 24/7 [place], [place] [address] [phone]   For cough: Flonase daily until next visit Try omeprazole 20 mg OTC one tablet before breakfast  Come back for a physical exam in 4-5 weeks

## 2014-04-07 ENCOUNTER — Other Ambulatory Visit: Payer: Self-pay | Admitting: Internal Medicine

## 2014-04-07 DIAGNOSIS — R0981 Nasal congestion: Secondary | ICD-10-CM

## 2014-04-07 NOTE — Telephone Encounter (Signed)
Refill for [facility] [address]

## 2014-05-05 ENCOUNTER — Ambulatory Visit (HOSPITAL_BASED_OUTPATIENT_CLINIC_OR_DEPARTMENT_OTHER)
Admission: RE | Admit: 2014-05-05 | Discharge: 2014-05-05 | Disposition: A | Discharge status: Home / Self Care | Payer: 59 | Source: Ambulatory Visit | Attending: Medical | Admitting: Medical

## 2014-05-05 ENCOUNTER — Encounter: Payer: Self-pay | Admitting: Medical

## 2014-05-05 ENCOUNTER — Ambulatory Visit (INDEPENDENT_AMBULATORY_CARE_PROVIDER_SITE_OTHER): Payer: 59 | Admitting: Medical

## 2014-05-05 ENCOUNTER — Telehealth: Payer: Self-pay | Admitting: Internal Medicine

## 2014-05-05 ENCOUNTER — Other Ambulatory Visit: Payer: Self-pay | Admitting: Medical

## 2014-05-05 ENCOUNTER — Telehealth: Payer: Self-pay | Admitting: *Deleted

## 2014-05-05 VITALS — BP 111/69 | HR 72 | Temp 97.9°F | Resp 16 | Wt 152.4 lb

## 2014-05-05 DIAGNOSIS — M898X1 Other specified disorders of bone, shoulder: Secondary | ICD-10-CM

## 2014-05-05 DIAGNOSIS — X58XXXA Exposure to other specified factors, initial encounter: Secondary | ICD-10-CM | POA: Diagnosis not present

## 2014-05-05 DIAGNOSIS — M899 Disorder of bone, unspecified: Secondary | ICD-10-CM

## 2014-05-05 DIAGNOSIS — M19019 Primary osteoarthritis, unspecified shoulder: Secondary | ICD-10-CM | POA: Diagnosis not present

## 2014-05-05 DIAGNOSIS — S40019A Contusion of unspecified shoulder, initial encounter: Secondary | ICD-10-CM | POA: Insufficient documentation

## 2014-05-05 DIAGNOSIS — M25519 Pain in unspecified shoulder: Secondary | ICD-10-CM | POA: Insufficient documentation

## 2014-05-05 DIAGNOSIS — M542 Cervicalgia: Secondary | ICD-10-CM

## 2014-05-05 DIAGNOSIS — M949 Disorder of cartilage, unspecified: Secondary | ICD-10-CM

## 2014-05-05 DIAGNOSIS — M25512 Pain in left shoulder: Secondary | ICD-10-CM

## 2014-05-05 MED ORDER — DICLOFENAC SODIUM 75 MG PO TBEC: 75.0000 mg | DELAYED_RELEASE_TABLET | Freq: Two times a day (BID) | ORAL | Status: DC

## 2014-05-05 MED ORDER — CYCLOBENZAPRINE HCL 5 MG PO TABS: 5.0000 mg | ORAL_TABLET | Freq: Every day | ORAL | Status: DC

## 2014-05-05 NOTE — Telephone Encounter (Signed)
Pt called complaining of tingling in fingers on left hand, left side neck, shoulder and back pain.  Transferred to [facility], spoke with Olivia Mackie.

## 2014-05-05 NOTE — Patient Instructions (Signed)
I have put in xrays of your cervical spine, lt shoulder and lt scapula. I have called in diclofenac and cyclobenzaprine to your pharmacy. Will call you on the xray results. I want you to follow up in 10 days or sooner if needed. If symptoms worsen would consider mri. If some improved/residual symptoms  might consider physical therapy.

## 2014-05-05 NOTE — Telephone Encounter (Signed)
FYI, encounter closed due to pt being scheduled.

## 2014-05-05 NOTE — Progress Notes (Signed)
Pre visit review using our clinic review tool, if applicable. No additional management support is needed unless otherwise documented below in the visit note. 
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
## 2014-05-20 ENCOUNTER — Ambulatory Visit (INDEPENDENT_AMBULATORY_CARE_PROVIDER_SITE_OTHER): Payer: 59 | Admitting: Medical

## 2014-05-20 ENCOUNTER — Encounter: Payer: Self-pay | Admitting: Medical

## 2014-05-20 VITALS — BP 122/64 | HR 64 | Temp 98.2°F | Wt 153.0 lb

## 2014-05-20 DIAGNOSIS — R209 Unspecified disturbances of skin sensation: Secondary | ICD-10-CM

## 2014-05-20 DIAGNOSIS — S46812S Strain of other muscles, fascia and tendons at shoulder and upper arm level, left arm, sequela: Secondary | ICD-10-CM

## 2014-05-20 DIAGNOSIS — S46819A Strain of other muscles, fascia and tendons at shoulder and upper arm level, unspecified arm, initial encounter: Secondary | ICD-10-CM | POA: Insufficient documentation

## 2014-05-20 DIAGNOSIS — R202 Paresthesia of skin: Secondary | ICD-10-CM

## 2014-05-20 DIAGNOSIS — R200 Anesthesia of skin: Secondary | ICD-10-CM | POA: Insufficient documentation

## 2014-05-20 DIAGNOSIS — S46812A Strain of other muscles, fascia and tendons at shoulder and upper arm level, left arm, initial encounter: Secondary | ICD-10-CM | POA: Insufficient documentation

## 2014-05-20 DIAGNOSIS — M25519 Pain in unspecified shoulder: Secondary | ICD-10-CM

## 2014-05-20 DIAGNOSIS — IMO0002 Reserved for concepts with insufficient information to code with codable children: Secondary | ICD-10-CM

## 2014-05-20 DIAGNOSIS — M25512 Pain in left shoulder: Secondary | ICD-10-CM

## 2014-05-20 NOTE — Assessment & Plan Note (Signed)
Associated with lt trapezius pain on last visit. But now he is much improved.

## 2014-05-20 NOTE — Progress Notes (Signed)
Subjective:    Patient ID: Bradley Meyer, male    DOB: 04-Dec-1955, 58 y.o.   MRN: [id number]  HPI  Pt states he still has some trapezius pain. Mild level pain. Pt has mild tingling sensation in his 2nd and 3rd tingling/ mild numb. Both are better but faint symptoms now( about 85 % better). No mid cspine pain. Shoulder feels better. Only faint weak. Better than before. xrays reviewed with pt today.    No past medical history on file.  History   Social History  . Marital Status: Married    Spouse Name: N/A    Number of Children: N/A  . Years of Education: N/A   Occupational History  . Not on file.   Social History Main Topics  . Smoking status: Former Smoker    Quit date: 07/01/2007  . Smokeless tobacco: Not on file  . Alcohol Use: 0.6 oz/week    1 Cans of beer per week  . Drug Use: No  . Sexual Activity: Not on file   Other Topics Concern  . Not on file   Social History Narrative  . No narrative on file    Past Surgical History  Procedure Laterality Date  . Appendectomy  1971  . Knee arthroscopy  2002    right  . Colonoscopy    . Polypectomy      Family History  Problem Relation Age of Onset  . Colon cancer Neg Hx   . Stomach cancer Neg Hx     No Known Allergies  Current Outpatient Prescriptions on File Prior to Visit  Medication Sig Dispense Refill  . cyclobenzaprine (FLEXERIL) 5 MG tablet Take 1 tablet (5 mg total) by mouth at bedtime.  10 tablet  1  . diclofenac (VOLTAREN) 75 MG EC tablet Take 1 tablet (75 mg total) by mouth 2 (two) times daily.  30 tablet  0  . fluticasone (FLONASE) 50 MCG/ACT nasal spray PLACE 2 SPRAYS INTO BOTH NOSTRILS DAILY.  16 g  4   No current facility-administered medications on file prior to visit.    BP 122/64  Pulse 64  Temp(Src) 98.2 F (36.8 C)  Wt 153 lb (69.4 kg)  SpO2 98%    Review of Systems  Constitutional: Negative for fever, chills and fatigue.  Respiratory: Negative for cough, chest tightness and  wheezing.   Cardiovascular: Negative for chest pain and palpitations.  Musculoskeletal:       Lt trapezius pain. No mid cspine pain. Shoulder feels better.(Only slight faint weak sensation). 2nd and 3rd digit pads have more sensation.    Neurological: Negative for dizziness, seizures, syncope, weakness and headaches.       Faint 2nd and 3rd digit pad numbness sensation. Worst 2nd digit and feels better now.  Hematological: Negative for adenopathy. Does not bruise/bleed easily.       Objective:   Physical Exam  Constitutional: He is oriented to person, place, and time. He appears well-developed and well-nourished. No distress.  HENT:  Head: Normocephalic.  Right Ear: External ear normal.  Left Ear: External ear normal.  Neck: Normal range of motion. Neck supple. No JVD present. No tracheal deviation present. No thyromegaly present.  Cardiovascular: Normal rate, regular rhythm and normal heart sounds.   Pulmonary/Chest: Effort normal and breath sounds normal. No stridor. No respiratory distress. He has no wheezes. He has no rales. He exhibits no tenderness.  Musculoskeletal:  Cspine- no tenderness on palpation or rom.  Lt trapezius- over mid body mild  tenderness.  Lt scapula- no tenderness to palpation. Lt shoulder- from of motion and no crepitus. No tenderness on palpation. Lt hand- from. Lt 2nd digit mild occasional difficult sharp and dull discimination. Lt 3rd digit disciminiation intact.  Lymphadenopathy:    He has no cervical adenopathy.  Neurological: He is alert and oriented to person, place, and time. No cranial nerve deficit. Coordination normal.  Skin: He is not diaphoretic.  Psychiatric: He has a normal mood and affect. His behavior is normal. Judgment and thought content normal.          Assessment & Plan:

## 2014-05-20 NOTE — Assessment & Plan Note (Signed)
This was present on last visit. He had some numbness of lt 2nd and 3rd digit. Now he is much better. This is faintly present now. I did advise this does not resolve 100% then consider mri of neck. Particularly if he has any associated cspine pain. Pt will update me in 10 days. He is so near complete resolution that I think this is reasonable approach.

## 2014-05-20 NOTE — Progress Notes (Signed)
Pre visit review using our clinic review tool, if applicable. No additional management support is needed unless otherwise documented below in the visit note. 

## 2014-05-20 NOTE — Assessment & Plan Note (Signed)
Much improved. He does not  hav any mid cspine pain. Pain only mild faint transient not. He is significantly improved so I think no further treatment needed. If any residual pain in in 10 days then notify then will refer to PT.

## 2014-07-01 ENCOUNTER — Encounter: Payer: Self-pay | Admitting: Internal Medicine

## 2014-07-01 ENCOUNTER — Ambulatory Visit (INDEPENDENT_AMBULATORY_CARE_PROVIDER_SITE_OTHER): Payer: 59 | Admitting: Internal Medicine

## 2014-07-01 VITALS — BP 133/74 | HR 64 | Temp 98.5°F | Ht 71.0 in | Wt 151.4 lb

## 2014-07-01 DIAGNOSIS — C4491 Basal cell carcinoma of skin, unspecified: Secondary | ICD-10-CM | POA: Insufficient documentation

## 2014-07-01 DIAGNOSIS — Z0000 Encounter for general adult medical examination without abnormal findings: Secondary | ICD-10-CM

## 2014-07-01 DIAGNOSIS — R5381 Other malaise: Secondary | ICD-10-CM | POA: Insufficient documentation

## 2014-07-01 DIAGNOSIS — R5383 Other fatigue: Secondary | ICD-10-CM

## 2014-07-01 DIAGNOSIS — Z23 Encounter for immunization: Secondary | ICD-10-CM

## 2014-07-01 NOTE — Assessment & Plan Note (Addendum)
Td 06 zostavax discussed before  Flu shot -- today  [place] (random biopsies were normal, terminal ileum was normal appearing and normal by biopsy, 82mm pedunculated TVA was removed from rectum).  colonoscopy 07/2011--- next 5 years  Has a very healthy diet, he plays tennis and bikes sometimes up to 35 miles without any problems. Pt is somehow concerned about his weight but  his BMI is normal.  Labs

## 2014-07-01 NOTE — Patient Instructions (Signed)
Stop by the front desk and schedule labs to be done within few days (fasting) oreders are in  Please come back to the office in 1 year for a physical exam. Come back fasting

## 2014-07-01 NOTE — Assessment & Plan Note (Addendum)
The patient has extremely healthy lifestyle and is able to road bike up to 35 miles however he feels occasionally a lack of stamina and muscle fatigue. Denies any actual arthjralgias, joint swelling, decreased libido or erectile dysfunction. He also endorses severe snoring, he is having to sleep in a different bedroom from his wife d/t snoring epworth  sleepiness scale scored 5 which is neg Plan-- observe, labs (vit D and B12 ), if no better will call, refer to pulmonary? Sleep study ?

## 2014-07-01 NOTE — Progress Notes (Signed)
Subjective:    Patient ID: Bradley Meyer, male    DOB: 01/30/1956, 58 y.o.   MRN: [id number]  DOS:  07/01/2014 Type of visit - description : CPX Interval history: In general feeling well, has a couple of other issues likes to discuss. Has a growth on the left leg for the last 8 months. Continue with a sense of lack of stamina, fatigue --- see assessment and plan  ROS Denies chest pain, difficulty breathing, abdomen or lower extremity edema. No nausea vomiting. His stools are loose which is a chronic problem. No blood in the stools No cough or bronchial congestion No anxiety  depression No dysuria, gross hematuria or difficulty urinating  Past Medical History  Diagnosis Date  . Anxiety     h/o  . Palpitation      2010, saw [facility]iology (w/u per patient negative)    . BCC (basal cell carcinoma of skin)     nose 2006  . Vitreous detachment 05-2010    Past Surgical History  Procedure Laterality Date  . Appendectomy  1971  . Knee arthroscopy  2002    right  . Colonoscopy    . Polypectomy      History   Social History  . Marital Status: Married    Spouse Name: N/A    Number of Children: 0  . Years of Education: N/A   Occupational History  . Geophysical data processor     Social History Main Topics  . Smoking status: Former Smoker    Quit date: 07/01/2007  . Smokeless tobacco: Never Used  . Alcohol Use: 0.6 oz/week    1 Cans of beer per week  . Drug Use: No  . Sexual Activity: Not on file   Other Topics Concern  . Not on file   Social History Narrative   moved here from [place] 10-2008     Occupation: Art gallery manager     Married , no children           Family History  Problem Relation Age of Onset  . Colon cancer Neg Hx   . Stomach cancer Neg Hx   . Prostate cancer Father 4  . Diabetes Neg Hx   . CAD Other     uncle, in his 55s       Medication List       This list is accurate as of: 07/01/14  6:30 PM.  Always use your most recent med list.                 fluticasone 50 MCG/ACT nasal spray  Commonly known as:  FLONASE  PLACE 2 SPRAYS INTO BOTH NOSTRILS DAILY.           Objective:   Physical Exam  Skin:      BP 133/74  Pulse 64  Temp(Src) 98.5 F (36.9 C) (Oral)  Ht 5\' 11"  (1.803 m)  Wt 151 lb 6 oz (68.663 kg)  BMI 21.12 kg/m2  SpO2 100% General -- alert, well-developed, thin but healthy appearing, NAD.  Neck --no thyromegaly , normal carotid pulse  HEENT-- Not pale.  Lungs -- normal respiratory effort, no intercostal retractions, no accessory muscle use, and normal breath sounds.  Heart-- normal rate, regular rhythm, no murmur.  Abdomen-- Not distended, good bowel sounds,soft, non-tender. No rebound or rigidity.   Rectal-- No external abnormalities noted. Normal sphincter tone. No rectal masses or tenderness. Stool brown  Prostate--Prostate gland firm and smooth, no enlargement, nodularity, tenderness, mass, asymmetry  or induration. Extremities-- no pretibial edema bilaterally  Neurologic--  alert & oriented X3. Speech normal, gait appropriate for age, strength symmetric and appropriate for age.   Psych-- Cognition and judgment appear intact. Cooperative with normal attention span and concentration. No anxious or depressed appearing.        Assessment & Plan:

## 2014-07-01 NOTE — Progress Notes (Signed)
Pre visit review using our clinic review tool, if applicable. No additional management support is needed unless otherwise documented below in the visit note. 

## 2014-07-01 NOTE — Assessment & Plan Note (Signed)
History of BCC, and now has a persistent lesion on the left leg x ~ 8 months, refer to dermatology in [place]

## 2014-07-04 ENCOUNTER — Other Ambulatory Visit (INDEPENDENT_AMBULATORY_CARE_PROVIDER_SITE_OTHER): Payer: 59

## 2014-07-04 DIAGNOSIS — R5381 Other malaise: Secondary | ICD-10-CM

## 2014-07-04 DIAGNOSIS — Z0000 Encounter for general adult medical examination without abnormal findings: Secondary | ICD-10-CM

## 2014-07-04 DIAGNOSIS — R5383 Other fatigue: Secondary | ICD-10-CM

## 2014-07-04 LAB — COMPREHENSIVE METABOLIC PANEL
ALBUMIN: 4 g/dL (ref 3.5–5.2)
ALT: 14 U/L (ref 0–53)
AST: 19 U/L (ref 0–37)
Alkaline Phosphatase: 80 U/L (ref 39–117)
BUN: 15 mg/dL (ref 6–23)
CO2: 28 meq/L (ref 19–32)
CREATININE: 1.2 mg/dL (ref 0.4–1.5)
Calcium: 9.1 mg/dL (ref 8.4–10.5)
Chloride: 105 mEq/L (ref 96–112)
GFR: 66.51 mL/min (ref >60.00)
GLUCOSE: 89 mg/dL (ref 70–99)
Potassium: 4.5 mEq/L (ref 3.5–5.1)
Sodium: 137 mEq/L (ref 135–145)
Total Bilirubin: 0.7 mg/dL (ref 0.2–1.2)
Total Protein: 6.6 g/dL (ref 6.0–8.3)

## 2014-07-04 LAB — CBC WITH DIFFERENTIAL/PLATELET
BASOS ABS: 0 10*3/uL (ref 0.0–0.1)
BASOS PCT: 0.6 % (ref 0.0–3.0)
Eosinophils Absolute: 0.1 10*3/uL (ref 0.0–0.7)
Eosinophils Relative: 1.2 % (ref 0.0–5.0)
HCT: 40.2 % (ref 39.0–52.0)
HEMOGLOBIN: 13.4 g/dL (ref 13.0–17.0)
Lymphocytes Relative: 27.2 % (ref 12.0–46.0)
Lymphs Abs: 1.4 10*3/uL (ref 0.7–4.0)
MCHC: 33.3 g/dL (ref 30.0–36.0)
MCV: 93.7 fl (ref 78.0–100.0)
MONOS PCT: 11.1 % (ref 3.0–12.0)
Monocytes Absolute: 0.6 10*3/uL (ref 0.1–1.0)
NEUTROS ABS: 3 10*3/uL (ref 1.4–7.7)
Neutrophils Relative %: 59.9 % (ref 43.0–77.0)
Platelets: 187 10*3/uL (ref 150.0–400.0)
RBC: 4.29 Mil/uL (ref 4.22–5.81)
RDW: 12.8 % (ref 11.5–15.5)
WBC: 5 10*3/uL (ref 4.0–10.5)

## 2014-07-04 LAB — LIPID PANEL
CHOL/HDL RATIO: 3
Cholesterol: 167 mg/dL (ref 0–200)
HDL: 60.2 mg/dL (ref >39.00)
LDL Cholesterol: 94 mg/dL (ref 0–99)
NonHDL: 106.8
TRIGLYCERIDES: 62 mg/dL (ref 0.0–149.0)
VLDL: 12.4 mg/dL (ref 0.0–40.0)

## 2014-07-04 LAB — VITAMIN D 25 HYDROXY (VIT D DEFICIENCY, FRACTURES): VITD: 33.37 ng/mL (ref 30.00–100.00)

## 2014-07-04 LAB — TSH: TSH: 1.64 u[IU]/mL (ref 0.35–4.50)

## 2014-07-04 LAB — FOLATE: FOLATE: 20.9 ng/mL (ref >5.9)

## 2014-07-04 LAB — VITAMIN B12: VITAMIN B 12: 214 pg/mL (ref 211–911)

## 2014-07-04 LAB — PSA: PSA: 1.32 ng/mL (ref 0.10–4.00)

## 2014-08-25 ENCOUNTER — Ambulatory Visit (INDEPENDENT_AMBULATORY_CARE_PROVIDER_SITE_OTHER): Payer: Medicare Other | Admitting: Ophthalmology

## 2014-08-25 DIAGNOSIS — H2513 Age-related nuclear cataract, bilateral: Secondary | ICD-10-CM

## 2014-08-25 DIAGNOSIS — H43813 Vitreous degeneration, bilateral: Secondary | ICD-10-CM

## 2014-08-25 DIAGNOSIS — H5213 Myopia, bilateral: Secondary | ICD-10-CM

## 2014-08-25 DIAGNOSIS — H33302 Unspecified retinal break, left eye: Secondary | ICD-10-CM

## 2014-09-23 ENCOUNTER — Telehealth: Payer: Self-pay | Admitting: Internal Medicine

## 2014-09-23 ENCOUNTER — Other Ambulatory Visit: Payer: Self-pay

## 2014-09-23 DIAGNOSIS — R0981 Nasal congestion: Secondary | ICD-10-CM

## 2014-09-23 MED ORDER — FLUTICASONE PROPIONATE 50 MCG/ACT NA SUSP: NASAL | Status: DC

## 2014-09-23 NOTE — Telephone Encounter (Signed)
Have not received refill request for this Pt as of today. Flonase refilled to [facility] on [address] as requested.

## 2014-09-23 NOTE — Telephone Encounter (Signed)
Caller name:Salguero, Dominica Severin Relation to BU:[id number] Call back number:[phone] Pharmacy:[facility]  Reason for call: pt is needing rx fluticasone (FLONASE) 50 MCG/ACT nasal spray states pharmacy has requested several times but no response

## 2014-11-04 ENCOUNTER — Encounter: Payer: Self-pay | Admitting: Internal Medicine

## 2014-11-04 ENCOUNTER — Ambulatory Visit (INDEPENDENT_AMBULATORY_CARE_PROVIDER_SITE_OTHER): Payer: 59 | Admitting: Internal Medicine

## 2014-11-04 VITALS — BP 123/78 | HR 67 | Temp 97.8°F | Ht 71.0 in | Wt 154.5 lb

## 2014-11-04 DIAGNOSIS — R1010 Upper abdominal pain, unspecified: Secondary | ICD-10-CM

## 2014-11-04 DIAGNOSIS — Z23 Encounter for immunization: Secondary | ICD-10-CM

## 2014-11-04 NOTE — Assessment & Plan Note (Addendum)
New problem 2 months history of upper abdominal pain, review of systems is positive for mild dysphagia to solids. Etiology of symptoms is not completely clear, will get a ultrasound of the abdomen to assess the gallbladder and the abdominal aorta. Because dysphagia, I will refer him to GI for consideration of EGD. In the meantime recommend Prilosec

## 2014-11-04 NOTE — Patient Instructions (Signed)
Take OTC Prilosec 20 mg one tablet before breakfast  Next visit by September 2016 for a physical

## 2014-11-04 NOTE — Progress Notes (Signed)
Pre visit review using our clinic review tool, if applicable. No additional management support is needed unless otherwise documented below in the visit note. 

## 2014-11-04 NOTE — Progress Notes (Signed)
Subjective:    Patient ID: Bradley Meyer, male    DOB: [birth year], 59 y.o.   MRN: [id number]  DOS:  11/04/2014 Type of visit - description : Acute visit Interval history: 2 months history of upper abdominal pain, bilateral, it is present all the time and described as mild-to-moderate. No change when he eats. Denies any radiation to the chest or back. He mentions something about E Coli  concern but he denies any concerns to me  Review of Systems  no fever or chills . No weight loss No nausea, vomiting,  does have chronic diarrhea on and off, no change in 2 years. No blood in the stools. No GERD symptoms but he admits to occasional dysphasia to solids, not liquids. No odynophagia, not feeling bloated   Past Medical History  Diagnosis Date  . Anxiety     h/o  . Palpitation      2010, saw [facility]iology (w/u per patient negative)    . BCC (basal cell carcinoma of skin)     nose 2006  . Vitreous detachment 05-2010    Past Surgical History  Procedure Laterality Date  . Appendectomy  1971  . Knee arthroscopy  2002    right  . Colonoscopy    . Polypectomy      History   Social History  . Marital Status: Married    Spouse Name: N/A    Number of Children: 0  . Years of Education: N/A   Occupational History  . Geophysical data processor     Social History Main Topics  . Smoking status: Former Smoker    Quit date: 07/01/2007  . Smokeless tobacco: Never Used  . Alcohol Use: 0.6 oz/week    1 Cans of beer per week  . Drug Use: No  . Sexual Activity: Not on file   Other Topics Concern  . Not on file   Social History Narrative   moved here from [place] 10-2008     Occupation: Art gallery manager     Married , no children            Medication List       This list is accurate as of: 11/04/14 11:59 PM.  Always use your most recent med list.               fluticasone 50 MCG/ACT nasal spray  Commonly known as:  FLONASE  PLACE 2 SPRAYS INTO BOTH NOSTRILS DAILY.            Objective:   Physical Exam  Constitutional: He is oriented to person, place, and time. He appears well-developed. No distress.  HENT:  Head: Normocephalic and atraumatic.  Cardiovascular:  RRR, no murmur, rub or gallop  Pulmonary/Chest: Effort normal. No respiratory distress.  CTA B  Abdominal: Soft. Bowel sounds are normal. He exhibits no distension and no mass. There is no tenderness. There is no rebound and no guarding.  Barely palpable, nontender aorta. No organomegaly. No bruit   Musculoskeletal: Normal range of motion. He exhibits no edema or tenderness.  Neurological: He is alert and oriented to person, place, and time. No cranial nerve deficit. He exhibits normal muscle tone. Coordination normal.  Speech normal, gait unassisted and normal for age, motor strength appropriate for age   Skin: Skin is warm and dry. No pallor.  Psychiatric: He has a normal mood and affect. His behavior is normal. Judgment and thought content normal.  Vitals reviewed.     Assessment & Plan:  Problem List Items Addressed This Visit    Upper abdominal pain    2 months history of upper abdominal pain, review of systems is positive for mild dysphagia to solids. Etiology of symptoms is not completely clear, will get a ultrasound of the abdomen to assess the gallbladder and the abdominal aorta. Because dysphagia, I will refer him to GI for consideration of EGD. In the meantime recommend Prilosec       Other Visit Diagnoses    Need for prophylactic vaccination with combined diphtheria-tetanus-pertussis (DTP) vaccine    -  Primary    Relevant Orders    Tdap vaccine greater than or equal to 7yo IM (Completed)

## 2014-11-06 ENCOUNTER — Ambulatory Visit (HOSPITAL_BASED_OUTPATIENT_CLINIC_OR_DEPARTMENT_OTHER)
Admission: RE | Admit: 2014-11-06 | Discharge: 2014-11-06 | Disposition: A | Discharge status: Home / Self Care | Payer: 59 | Source: Ambulatory Visit | Attending: Internal Medicine | Admitting: Internal Medicine

## 2014-11-06 DIAGNOSIS — Z800 Family history of malignant neoplasm of digestive organs: Secondary | ICD-10-CM | POA: Diagnosis not present

## 2014-11-06 DIAGNOSIS — R1010 Upper abdominal pain, unspecified: Secondary | ICD-10-CM

## 2014-11-06 DIAGNOSIS — R1084 Generalized abdominal pain: Secondary | ICD-10-CM | POA: Insufficient documentation

## 2014-11-10 ENCOUNTER — Ambulatory Visit (INDEPENDENT_AMBULATORY_CARE_PROVIDER_SITE_OTHER): Payer: 59 | Admitting: Nurse Practitioner

## 2014-11-10 ENCOUNTER — Encounter: Payer: Self-pay | Admitting: Nurse Practitioner

## 2014-11-10 ENCOUNTER — Other Ambulatory Visit (INDEPENDENT_AMBULATORY_CARE_PROVIDER_SITE_OTHER): Payer: 59

## 2014-11-10 VITALS — BP 116/70 | HR 64 | Ht 70.25 in | Wt 153.5 lb

## 2014-11-10 DIAGNOSIS — R1010 Upper abdominal pain, unspecified: Secondary | ICD-10-CM

## 2014-11-10 DIAGNOSIS — R1310 Dysphagia, unspecified: Secondary | ICD-10-CM

## 2014-11-10 LAB — LIPASE: LIPASE: 35 U/L (ref 11.0–59.0)

## 2014-11-10 LAB — CBC WITH DIFFERENTIAL/PLATELET
BASOS ABS: 0 10*3/uL (ref 0.0–0.1)
BASOS PCT: 0.9 % (ref 0.0–3.0)
EOS PCT: 1.1 % (ref 0.0–5.0)
Eosinophils Absolute: 0.1 10*3/uL (ref 0.0–0.7)
HEMATOCRIT: 41.4 % (ref 39.0–52.0)
Hemoglobin: 14.3 g/dL (ref 13.0–17.0)
LYMPHS ABS: 1.4 10*3/uL (ref 0.7–4.0)
LYMPHS PCT: 28.1 % (ref 12.0–46.0)
MCHC: 34.4 g/dL (ref 30.0–36.0)
MCV: 90.5 fl (ref 78.0–100.0)
Monocytes Absolute: 0.5 10*3/uL (ref 0.1–1.0)
Monocytes Relative: 9.6 % (ref 3.0–12.0)
Neutro Abs: 3 10*3/uL (ref 1.4–7.7)
Neutrophils Relative %: 60.3 % (ref 43.0–77.0)
Platelets: 210 10*3/uL (ref 150.0–400.0)
RBC: 4.58 Mil/uL (ref 4.22–5.81)
RDW: 12.7 % (ref 11.5–15.5)
WBC: 5.1 10*3/uL (ref 4.0–10.5)

## 2014-11-10 LAB — HEPATIC FUNCTION PANEL
ALT: 15 U/L (ref 0–53)
AST: 20 U/L (ref 0–37)
Albumin: 4.4 g/dL (ref 3.5–5.2)
Alkaline Phosphatase: 89 U/L (ref 39–117)
Bilirubin, Direct: 0.2 mg/dL (ref 0.0–0.3)
TOTAL PROTEIN: 7 g/dL (ref 6.0–8.3)
Total Bilirubin: 0.8 mg/dL (ref 0.2–1.2)

## 2014-11-10 NOTE — Progress Notes (Signed)
HPI :  Patient is a 59 year old male known to Dr. Ardis Hughs from previous colonoscopy. He is referred by PCP for evaluation of upper abdominal pain and dysphagia.  Approximately 2 months ago patient developed diffuse upper abdominal pain. Pain is not related to food, no known triggers. He describes the pain as a dull ache. No associated nausea. Recent ultrasound unrevealing. Patient has unintentionally lost 12 pounds over the last year though he became a vegan  one year ago which may be a contributing factor. He describes frequent solid food dysphagia. When food gets stuck he has to "hack it up". Occasional heartburn but no significant history of GERD. PCP started him on Prilosec last week.  Past Medical History  Diagnosis Date  . Anxiety     h/o  . Palpitation      2010, saw [facility]iology (w/u per patient negative)    . BCC (basal cell carcinoma of skin)     nose 2006  . Vitreous detachment 05-2010  . Colon polyp     Family History  Problem Relation Age of Onset  . Colon cancer Neg Hx   . Stomach cancer Neg Hx   . Prostate cancer Father 62  . Diabetes Neg Hx   . CAD Other     uncle, in his 12s  . Colon polyps Mother   . Kidney disease Neg Hx   . Gallbladder disease Neg Hx   . Esophageal cancer Neg Hx    History  Substance Use Topics  . Smoking status: Former Smoker -- 0.25 packs/day for 12 years    Types: Cigarettes    Quit date: 07/01/2007  . Smokeless tobacco: Never Used  . Alcohol Use: 0.6 oz/week    1 Cans of beer per week     Comment: Occassionally   Current Outpatient Prescriptions  Medication Sig Dispense Refill  . fluticasone (FLONASE) 50 MCG/ACT nasal spray PLACE 2 SPRAYS INTO BOTH NOSTRILS DAILY. 16 g 6  . omeprazole (PRILOSEC) 20 MG capsule Take 20 mg by mouth daily.     No current facility-administered medications for this visit.   No Known Allergies   Review of Systems: All systems reviewed and negative except where noted in HPI.    US Abdomen  Complete  11/06/2014   CLINICAL DATA:  Two months a generalized abdominal pain, past palpable aorta on clinical exam, family history of pancreatic malignancy  EXAM: ULTRASOUND ABDOMEN COMPLETE  COMPARISON:  Lower most images from a chest CT scan dated May 25, 2009  FINDINGS: Gallbladder: No gallstones or wall thickening visualized. No sonographic Murphy sign noted.  Common bile duct: Diameter: 4 mm  Liver: 1.3 cm diameter anechoic structure in the right hepatic lobe. There is no intrahepatic ductal dilation. Within normal limits in parenchymal echogenicity.  IVC: Visualization limited by bowel gas.  Pancreas: Visualization limited by bowel gas.  Spleen: Size and appearance within normal limits.  Right Kidney: Length: 10.2 cm. Echogenicity within normal limits. No mass or hydronephrosis visualized.  Left Kidney: Length: 10.3 cm. Echogenicity within normal limits. No mass or hydronephrosis visualized.  Abdominal aorta: Bowel gas limited evaluation of the aorta. No definite aneurysm was demonstrated.  Other findings: No ascites was demonstrated.  IMPRESSION: No acute hepatobiliary abnormality was demonstrated. Evaluation of the pancreas was limited by bowel gas. No acute abnormality elsewhere within the abdomen was demonstrated.   Electronically Signed   By: David  Martinique   On: 11/06/2014 11:34    Physical Exam: BP 116/70  mmHg  Pulse 64  Ht 5' 10.25" (1.784 m)  Wt 153 lb 8 oz (69.627 kg)  BMI 21.88 kg/m2 Constitutional: Pleasant,well-developed, white male in no acute distress. HEENT: Normocephalic and atraumatic. Conjunctivae are normal. No scleral icterus. Neck supple.  Cardiovascular: Normal rate, regular rhythm.  Pulmonary/chest: Effort normal and breath sounds normal. No wheezing, rales or rhonchi. Abdominal: Soft, nondistended, nontender. Bowel sounds active throughout. There are no masses palpable. No hepatomegaly. Extremities: no edema Lymphadenopathy: No cervical adenopathy  noted. Neurological: Alert and oriented to person place and time. Skin: Skin is warm and dry. No rashes noted. Psychiatric: Normal mood and affect. Behavior is normal.   ASSESSMENT AND PLAN:   59 year old male with two-month history of diffuse upper abdominal pain unrelated to meals. He had some weight loss, may be due in part to becoming a vegan. Negative ultraound. He reports intermittent some solid food dysphagia. Given these symptoms it is reasonable to proceed with an upper endoscopy with possible dilation. The benefits, risks, and potential complications of EGD with possible biopsies and/or dilation were discussed with the patient and he agrees to proceed. Will obtain LFTs, lipase and CBC today. Advised to continue Prilosec. If upper endoscopy is unrevealing and pain persists then may need need a CT scan

## 2014-11-10 NOTE — Patient Instructions (Signed)
Your physician has requested that you go to the basement for the following lab work before leaving today:  CBC, LFTS, Lipase  You have been scheduled for an endoscopy. Please follow written instructions given to you at your visit today. If you use inhalers (even only as needed), please bring them with you on the day of your procedure. Your physician has requested that you go to www.startemmi.com and enter the access code given to you at your visit today. This web site gives a general overview about your procedure. However, you should still follow specific instructions given to you by our office regarding your preparation for the procedure.

## 2014-11-11 NOTE — Progress Notes (Signed)
i agree with the above note, plan 

## 2014-11-26 ENCOUNTER — Encounter: Payer: Self-pay | Admitting: Gastroenterology

## 2015-01-05 ENCOUNTER — Telehealth: Payer: Self-pay | Admitting: Gastroenterology

## 2015-01-05 NOTE — Telephone Encounter (Signed)
Returned patient's call and he is sick with the flu and running temperature of 103.  He is scheduled for Endoscopy tomorrow with Dr. Ardis Hughs and I advised him that we would not be able to do the procedure until he is well.  I asked him to call back to the office to reschedule once he is feeling better.  He agreed to do so.  I told patient I would send this note to Dr. Ardis Hughs so he would be aware.  All questions were answered.

## 2015-01-05 NOTE — Telephone Encounter (Signed)
Ok, thanks.

## 2015-01-06 ENCOUNTER — Encounter: Payer: 59 | Admitting: Gastroenterology

## 2015-07-06 ENCOUNTER — Ambulatory Visit (INDEPENDENT_AMBULATORY_CARE_PROVIDER_SITE_OTHER): Payer: 59 | Admitting: Internal Medicine

## 2015-07-06 ENCOUNTER — Encounter: Payer: Self-pay | Admitting: Internal Medicine

## 2015-07-06 VITALS — BP 116/66 | HR 60 | Temp 97.7°F | Ht 70.0 in | Wt 157.5 lb

## 2015-07-06 DIAGNOSIS — Z1159 Encounter for screening for other viral diseases: Secondary | ICD-10-CM

## 2015-07-06 DIAGNOSIS — Z0000 Encounter for general adult medical examination without abnormal findings: Secondary | ICD-10-CM | POA: Diagnosis not present

## 2015-07-06 DIAGNOSIS — Z114 Encounter for screening for human immunodeficiency virus [HIV]: Secondary | ICD-10-CM

## 2015-07-06 DIAGNOSIS — Z09 Encounter for follow-up examination after completed treatment for conditions other than malignant neoplasm: Secondary | ICD-10-CM

## 2015-07-06 NOTE — Patient Instructions (Signed)
  Please schedule labs to be done within few days (fasting)    Next visit  for a   physical exam, fasting in one year    Please schedule an appointment at the front desk

## 2015-07-06 NOTE — Assessment & Plan Note (Addendum)
Td 2016 ; zostavax discussed before ; Flu shot at work [facility]: 05/[place]:random biopsies were normal, terminal ileum was normal appearing and normal by biopsy, 30mm pedunculated TVA was removed from rectum Colonoscopy 07/2011--- next 5 years Prostate cancer screening: DRE 2015 negative, PSA was normal. Reassess next year Labs  Doing great with lifestyle (bike, tennis)

## 2015-07-06 NOTE — Progress Notes (Signed)
Pre visit review using our clinic review tool, if applicable. No additional management support is needed unless otherwise documented below in the visit note. 

## 2015-07-06 NOTE — Progress Notes (Signed)
Subjective:    Patient ID: Bradley Meyer, male    DOB: 10-06-1955, 59 y.o.   MRN: [id number]  DOS:  07/06/2015 Type of visit - description :  CPX Interval history: the last time he was here, he had some GI symptoms, they resolved. Was concerned about his weight loss He is gaining weight.  Wt Readings from Last 3 Encounters:  07/06/15 157 lb 8 oz (71.442 kg)  11/10/14 153 lb 8 oz (69.627 kg)  11/04/14 154 lb 8 oz (70.081 kg)     Review of Systems  Constitutional: No fever. No chills. No unexplained wt changes. No unusual sweats  HEENT: No dental problems, no ear discharge, no facial swelling, no voice changes. No eye discharge, no eye  redness , no  intolerance to light   Respiratory: No wheezing , no  difficulty breathing. No cough , no mucus production  Cardiovascular: No CP, no leg swelling , no  Palpitations  GI: no nausea, no vomiting, no diarrhea , no  abdominal pain.  No blood in the stools. No dysphagia, no odynophagia    Endocrine: No polyphagia, no polyuria , no polydipsia  GU: No dysuria, gross hematuria, difficulty urinating. No urinary urgency, no frequency.  Musculoskeletal: occasional L knee pain, without swelling or redness. He is very active (road bike, tennis)  Skin: No change in the color of the skin, palor , no  Rash  Allergic, immunologic: No environmental allergies , no  food allergies  Neurological: No dizziness no  syncope. No headaches. No diplopia, no slurred, no slurred speech, no motor deficits, no facial  Numbness  Hematological: No enlarged lymph nodes, no easy bruising , no unusual bleedings  Psychiatry: No suicidal ideas, no hallucinations, no beavior problems, no confusion.  No unusual/severe anxiety, no depression   Past Medical History  Diagnosis Date  . Anxiety     h/o  . Palpitation      2010, saw [facility] Cardiology (w/u per patient negative)    . BCC (basal cell carcinoma of skin)     nose 2006  . Vitreous detachment  05-2010  . Colon polyp     Past Surgical History  Procedure Laterality Date  . Appendectomy  1971  . Knee arthroscopy  2002    right  . Colonoscopy    . Polypectomy      Social History   Social History  . Marital Status: Married    Spouse Name: N/A  . Number of Children: 0  . Years of Education: N/A   Occupational History  . Art gallery manager     Social History Main Topics  . Smoking status: Former Smoker -- 0.25 packs/day for 12 years    Types: Cigarettes    Quit date: 07/01/2007  . Smokeless tobacco: Never Used  . Alcohol Use: 0.6 oz/week    1 Cans of beer per week     Comment: Occassionally  . Drug Use: No  . Sexual Activity: Not on file   Other Topics Concern  . Not on file   Social History Narrative   moved here from [place] 10-2008     Occupation: Art gallery manager     Married , no children           Family History  Problem Relation Age of Onset  . Colon cancer Neg Hx   . Stomach cancer Neg Hx   . Prostate cancer Father 61  . Diabetes Neg Hx   . CAD Other  uncle, in his 67s  . Colon polyps Mother   . Kidney disease Neg Hx   . Gallbladder disease Neg Hx   . Esophageal cancer Neg Hx        Medication List       This list is accurate as of: 07/06/15 11:59 PM.  Always use your most recent med list.               fluticasone 50 MCG/ACT nasal spray  Commonly known as:  FLONASE  PLACE 2 SPRAYS INTO BOTH NOSTRILS DAILY.     omeprazole 20 MG capsule  Commonly known as:  PRILOSEC  Take 20 mg by mouth daily.           Objective:   Physical Exam BP 116/66 mmHg  Pulse 60  Temp(Src) 97.7 F (36.5 C) (Oral)  Ht 5\' 10"  (1.778 m)  Wt 157 lb 8 oz (71.442 kg)  BMI 22.60 kg/m2  SpO2 99% General:   Well developed, well nourished . NAD.  HEENT:  Normocephalic . Face symmetric, atraumatic Neck: No thyromegaly, normal carotid pulses Lungs:  CTA B Normal respiratory effort, no intercostal retractions, no accessory muscle use. Heart: RRR,   no murmur.  no pretibial edema bilaterally  Abdomen:  Not distended, soft, non-tender. No rebound or rigidity.  MSK: knees symmetric, no deformities, no effusion. Range of motion normal. No TTP. Skin: Not pale. Not jaundice Neurologic:  alert & oriented X3.  Speech normal, gait appropriate for age and unassisted Psych--  Cognition and judgment appear intact.  Cooperative with normal attention span and concentration.  Behavior appropriate. No anxious or depressed appearing.    Assessment & Plan:   Assessment> H/o Anxiety H/o Palpitations 2010, saw cards, workup (-) per pt BCC nose 2016 Vitreous detachment 2011  Plan GI: Was seen a few months ago with abdominal discomfort, dysphagia, ultrasound was negative, saw GI, they recommended EGD. Patient had to cancel the procedure due to a intercurrent illness, currently asymptomatic, has gained some weight.Recommend not further eval unless the symptoms resurface knee pain: DJD? Symptoms are mild, recommend to continue ice prn as he is doing, call for a sports medicine referral if needed

## 2015-07-07 ENCOUNTER — Other Ambulatory Visit (INDEPENDENT_AMBULATORY_CARE_PROVIDER_SITE_OTHER): Payer: 59

## 2015-07-07 DIAGNOSIS — Z1159 Encounter for screening for other viral diseases: Secondary | ICD-10-CM

## 2015-07-07 DIAGNOSIS — Z0000 Encounter for general adult medical examination without abnormal findings: Secondary | ICD-10-CM

## 2015-07-07 DIAGNOSIS — Z114 Encounter for screening for human immunodeficiency virus [HIV]: Secondary | ICD-10-CM

## 2015-07-07 DIAGNOSIS — Z09 Encounter for follow-up examination after completed treatment for conditions other than malignant neoplasm: Secondary | ICD-10-CM | POA: Insufficient documentation

## 2015-07-07 LAB — LIPID PANEL
CHOLESTEROL: 137 mg/dL (ref 0–200)
HDL: 56.6 mg/dL (ref >39.00)
LDL Cholesterol: 69 mg/dL (ref 0–99)
NONHDL: 80.44
Total CHOL/HDL Ratio: 2
Triglycerides: 57 mg/dL (ref 0.0–149.0)
VLDL: 11.4 mg/dL (ref 0.0–40.0)

## 2015-07-07 LAB — BASIC METABOLIC PANEL
BUN: 15 mg/dL (ref 6–23)
CALCIUM: 8.8 mg/dL (ref 8.4–10.5)
CO2: 28 mEq/L (ref 19–32)
CREATININE: 1.2 mg/dL (ref 0.40–1.50)
Chloride: 109 mEq/L (ref 96–112)
GFR: 66.27 mL/min (ref >60.00)
GLUCOSE: 93 mg/dL (ref 70–99)
Potassium: 4.3 mEq/L (ref 3.5–5.1)
Sodium: 142 mEq/L (ref 135–145)

## 2015-07-07 LAB — TSH: TSH: 0.94 u[IU]/mL (ref 0.35–4.50)

## 2015-07-07 NOTE — Assessment & Plan Note (Signed)
GI: Was seen a few months ago with abdominal discomfort, dysphagia, ultrasound was negative, saw GI, they recommended EGD. Patient had to cancel the procedure due to a intercurrent illness, currently asymptomatic, has gained some weight.Recommend not further eval unless the symptoms resurface knee pain: DJD? Symptoms are mild, recommend to continue ice prn as he is doing, call for a sports medicine referral if needed

## 2015-07-08 LAB — HIV ANTIBODY (ROUTINE TESTING W REFLEX): HIV 1&2 Ab, 4th Generation: NONREACTIVE

## 2015-07-08 LAB — HEPATITIS C ANTIBODY: HCV Ab: NEGATIVE

## 2015-08-27 IMAGING — CR DG SCAPULA*L*
1 series · 1 of 1 positions shown · non-contrast
Comparison: Chest radiographs 01/01/2014.

CLINICAL DATA: Scapular pain following injury last week.

EXAM:
LEFT SCAPULA - 2+ VIEWS

[w shoulder y view left]
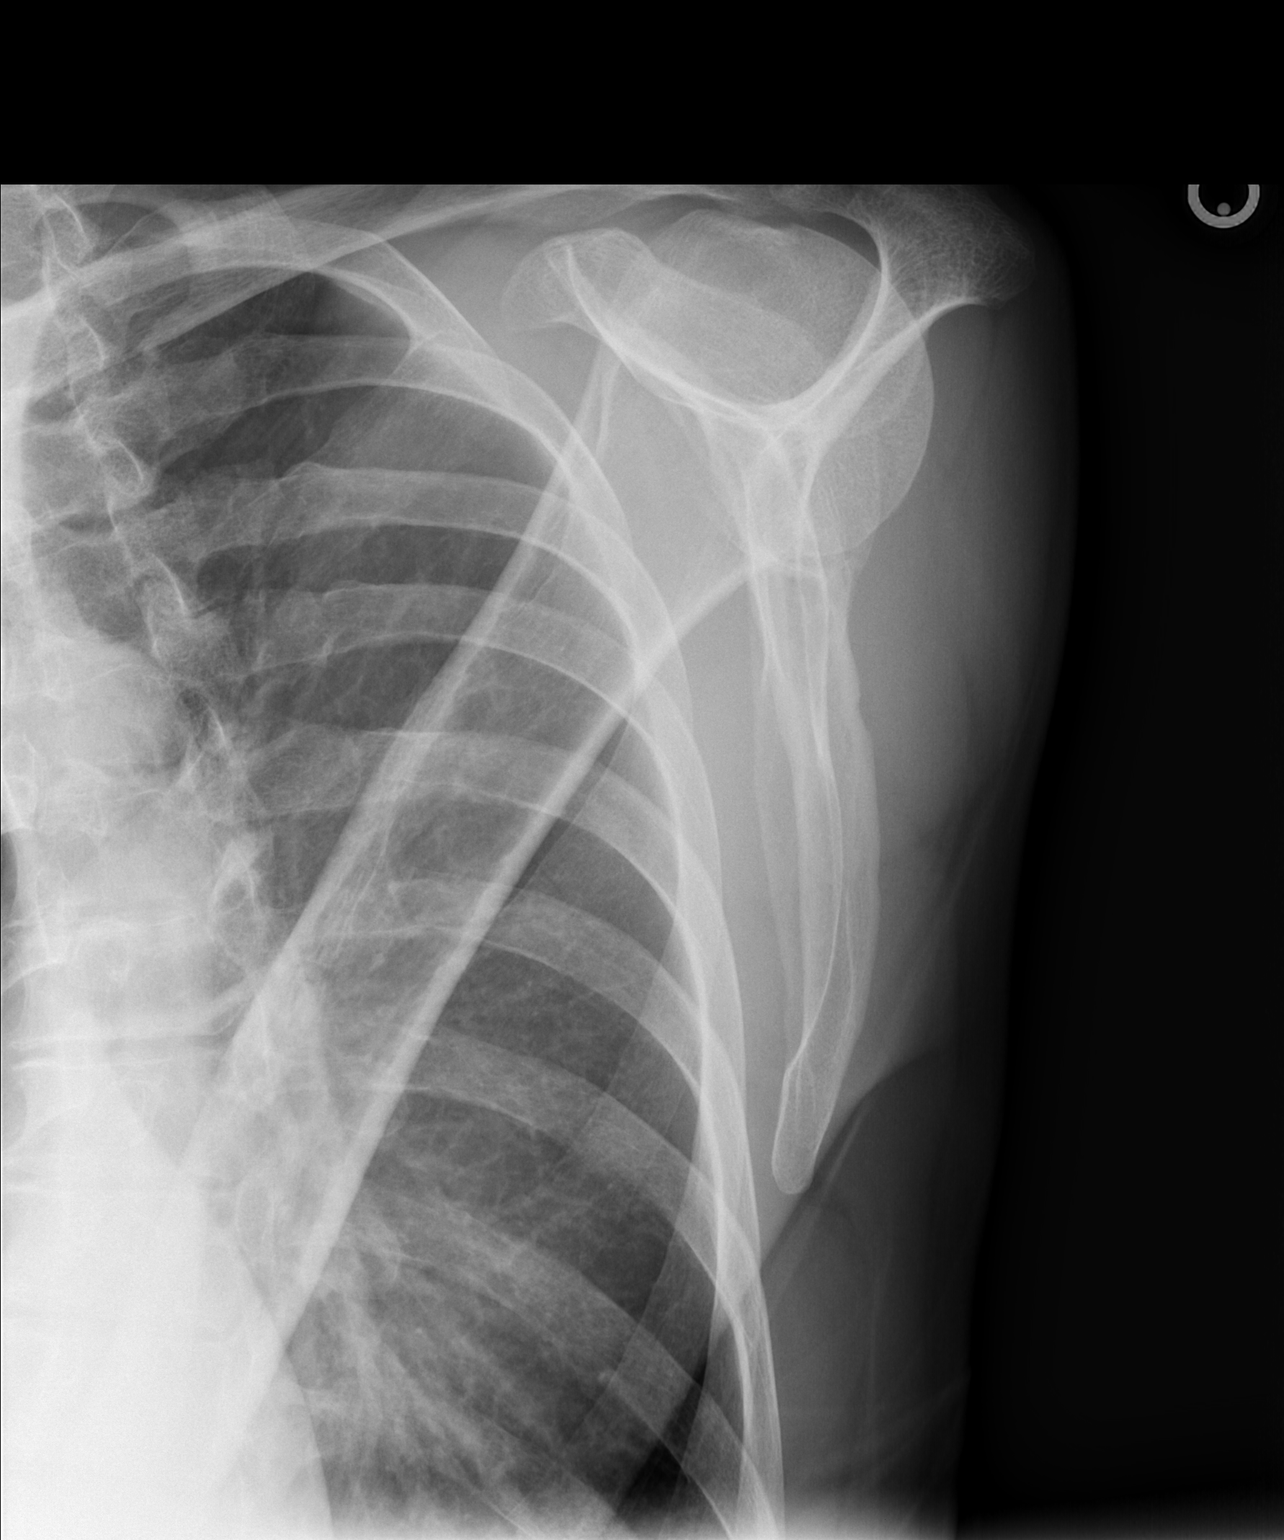

[1 of 1 positions shown; findings below may reference images not displayed]

FINDINGS: The mineralization and alignment are normal. There is no evidence of
acute fracture or dislocation. No focal soft tissue abnormalities
are demonstrated.
IMPRESSION: No acute osseous findings demonstrated.

## 2015-08-27 IMAGING — CR DG SHOULDER 2+V*L*
2 series · 2 of 2 positions shown · non-contrast
Comparison: None.

CLINICAL DATA: Pain post trauma

EXAM:
LEFT SHOULDER - 2+ VIEW

[w shoulder ap internal left *]
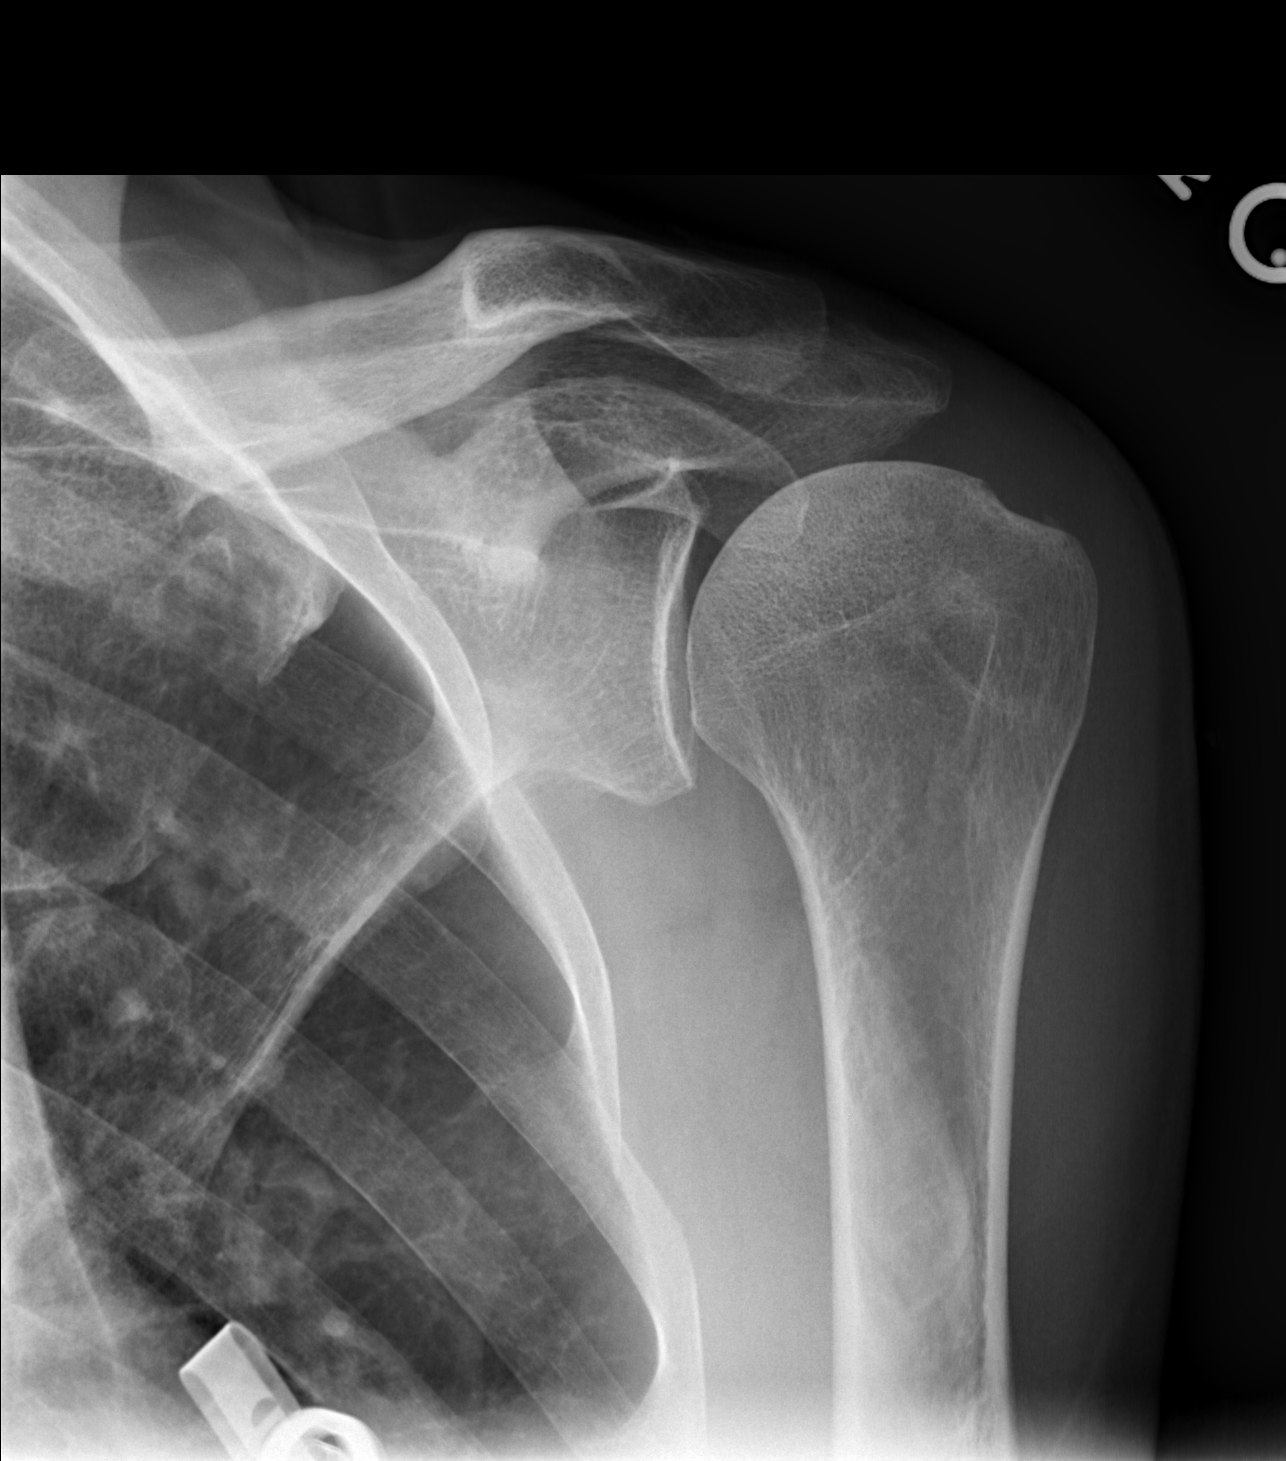

[x shoulder axillary left]
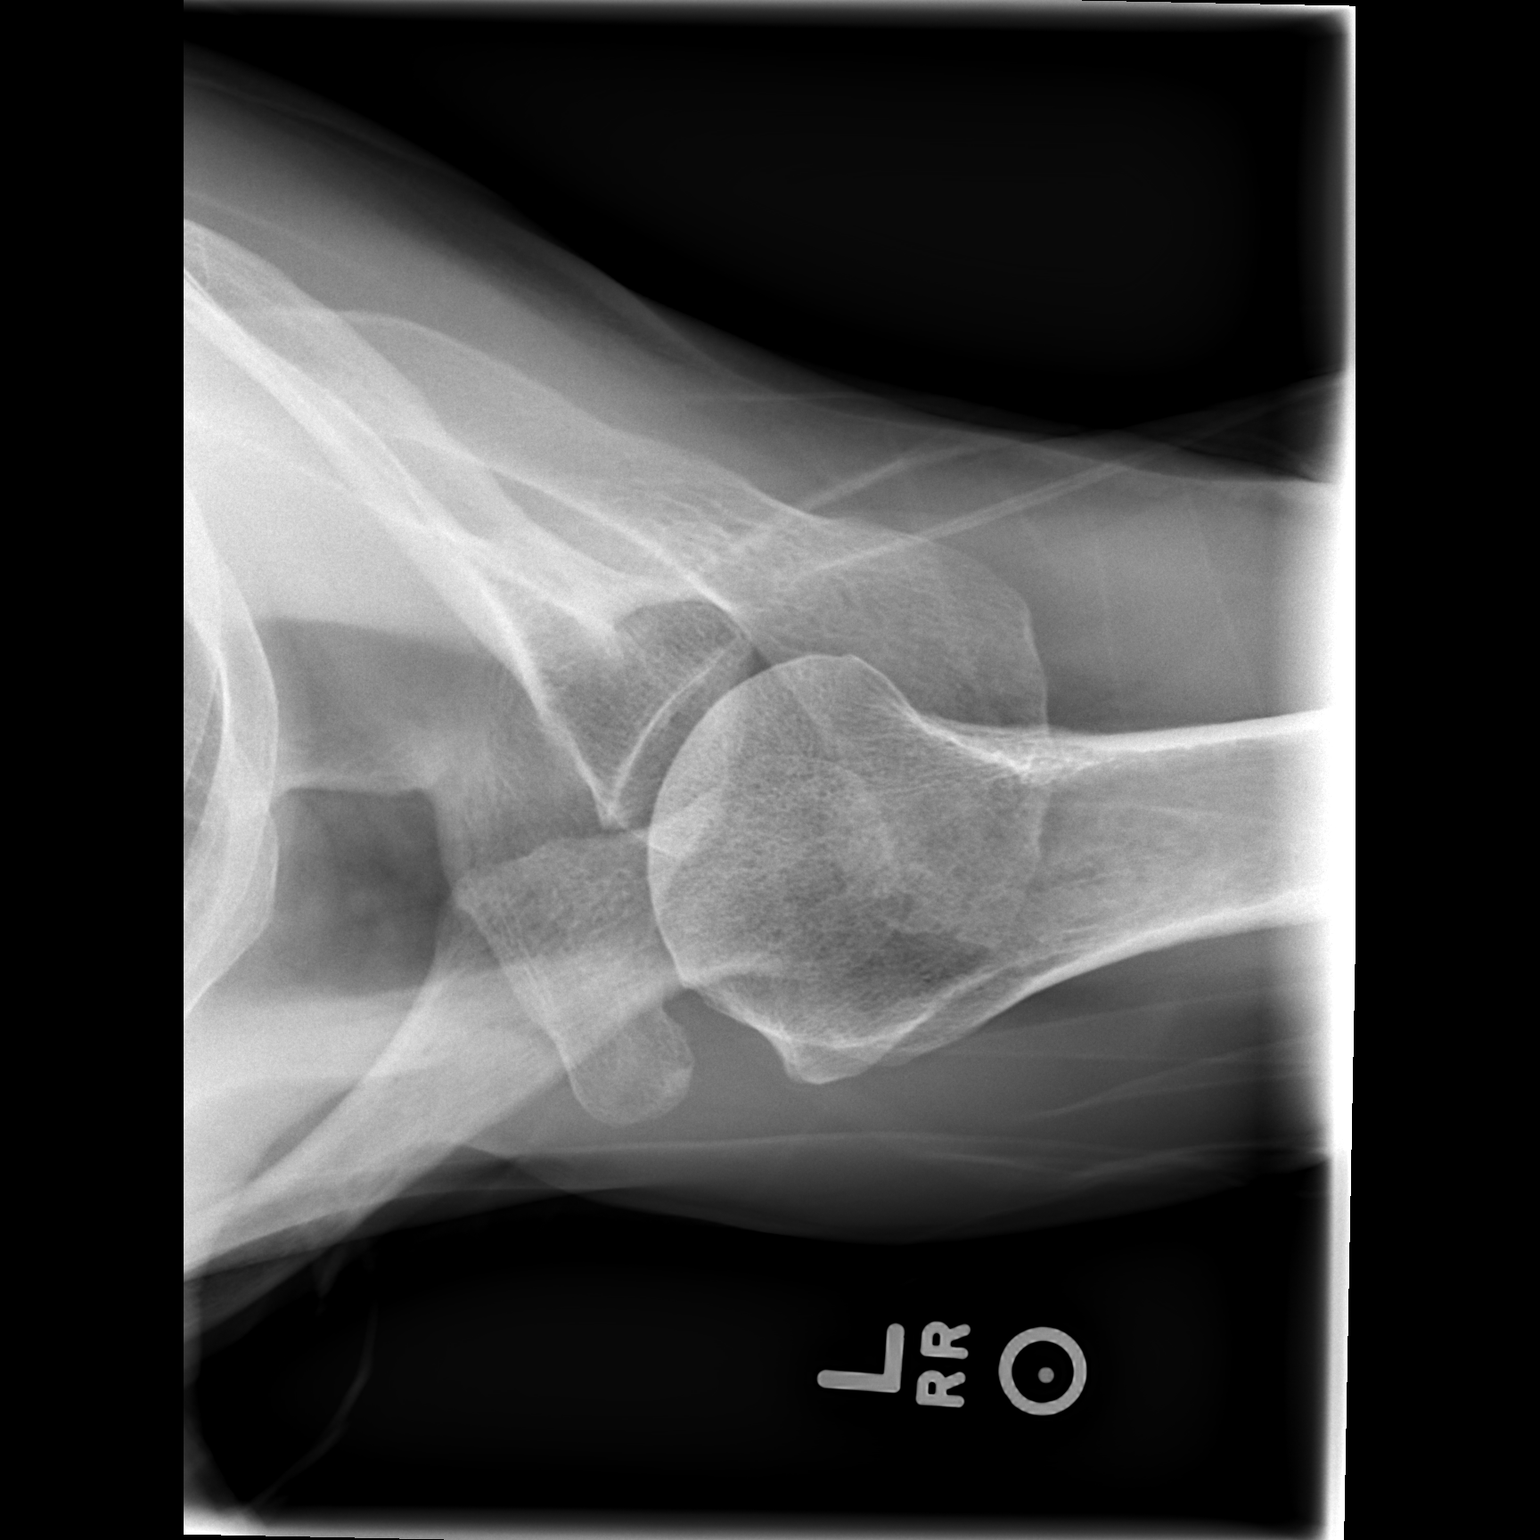

[2 of 2 positions shown; findings below may reference images not displayed]

FINDINGS: Frontal, Y scapular, and axillary views were obtained. There is no
fracture or dislocation. There is moderate osteoarthritic change in
the acromioclavicular joint. No erosive change.
IMPRESSION: Moderate osteoarthritic change in acromioclavicular joint. No
fracture or dislocation.

## 2015-09-23 ENCOUNTER — Ambulatory Visit (INDEPENDENT_AMBULATORY_CARE_PROVIDER_SITE_OTHER): Payer: 59 | Admitting: Ophthalmology

## 2015-09-23 DIAGNOSIS — H33302 Unspecified retinal break, left eye: Secondary | ICD-10-CM | POA: Diagnosis not present

## 2015-09-23 DIAGNOSIS — H43813 Vitreous degeneration, bilateral: Secondary | ICD-10-CM | POA: Diagnosis not present

## 2015-09-23 DIAGNOSIS — H2513 Age-related nuclear cataract, bilateral: Secondary | ICD-10-CM

## 2016-05-25 ENCOUNTER — Encounter: Payer: Self-pay | Admitting: Gastroenterology

## 2016-06-14 ENCOUNTER — Ambulatory Visit (INDEPENDENT_AMBULATORY_CARE_PROVIDER_SITE_OTHER): Payer: 59 | Admitting: Internal Medicine

## 2016-06-14 ENCOUNTER — Encounter: Payer: Self-pay | Admitting: Internal Medicine

## 2016-06-14 VITALS — BP 122/76 | HR 62 | Temp 98.2°F | Resp 12 | Ht 70.0 in | Wt 160.2 lb

## 2016-06-14 DIAGNOSIS — Z23 Encounter for immunization: Secondary | ICD-10-CM | POA: Diagnosis not present

## 2016-06-14 DIAGNOSIS — R1010 Upper abdominal pain, unspecified: Secondary | ICD-10-CM | POA: Diagnosis not present

## 2016-06-14 NOTE — Patient Instructions (Signed)
GO TO THE LAB : Get the blood work     [place]  Please reschedule your physical exam at your convenience

## 2016-06-14 NOTE — Progress Notes (Signed)
Pre visit review using our clinic review tool, if applicable. No additional management support is needed unless otherwise documented below in the visit note. 

## 2016-06-14 NOTE — Progress Notes (Signed)
Subjective:    Patient ID: Bradley Meyer, male    DOB: [birth year], 58 y.o.   MRN: [id number]  DOS:  06/14/2016 Type of visit - description : Acute visit Interval history: GI symptoms Resurface. Symptoms started approximately 1.5 years ago with on and off stomach pain. Symptoms came back 2 months ago and they are more frequent than before. He has pain at the upper abdomen bilaterally described as a "punch" in the stomach. Denies any burning or colicky type of symptoms. Pain may last hours, question of increasing sx after eating, no radiation. Denies taking any NSAIDs or  OTCs except for a B12.  Wt Readings from Last 3 Encounters:  06/14/16 160 lb 4 oz (72.7 kg)  07/06/15 157 lb 8 oz (71.4 kg)  11/10/14 153 lb 8 oz (69.6 kg)     Review of Systems No fever chills or weight loss Bowel movements are regular. No nausea, vomiting, blood in the stools. No dysphagia, odynophagia or classic heartburn. Has also noted a ill-defined suprapubic discomfort when he runs. No dysuria or gross hematuria  Past Medical History:  Diagnosis Date  . Anxiety    h/o  . BCC (basal cell carcinoma of skin)    nose 2006  . Colon polyp   . Palpitation     2010, saw [facility] Cardiology (w/u per patient negative)    . Vitreous detachment 05-2010    Past Surgical History:  Procedure Laterality Date  . APPENDECTOMY  1971  . KNEE ARTHROSCOPY  2002   right  . POLYPECTOMY      Social History   Social History  . Marital status: Married    Spouse name: N/A  . Number of children: 0  . Years of education: N/A   Occupational History  . Art gallery manager  [employer]   Social History Main Topics  . Smoking status: Former Smoker    Packs/day: 0.25    Years: 12.00    Types: Cigarettes    Quit date: 07/01/2007  . Smokeless tobacco: Never Used  . Alcohol use 0.6 oz/week    1 Cans of beer per week     Comment: Occassionally  . Drug use: No  . Sexual activity: Not on file   Other Topics Concern    . Not on file   Social History Narrative   moved here from [place] 10-2008     Occupation: Art gallery manager     Married , no children              Medication List    as of 06/14/2016 11:59 PM   You have not been prescribed any medications.        Objective:   Physical Exam BP 122/76 (BP Location: Left Arm, Patient Position: Sitting, Cuff Size: Normal)   Pulse 62   Temp 98.2 F (36.8 C) (Oral)   Resp 12   Ht 5\' 10"  (1.778 m)   Wt 160 lb 4 oz (72.7 kg)   SpO2 98%   BMI 22.99 kg/m  General:   Well developed, well nourished . NAD.  HEENT:  Normocephalic . Face symmetric, atraumatic. Not pale Neck: No LAD, no supraclavicular mass Lungs:  CTA B Normal respiratory effort, no intercostal retractions, no accessory muscle use. Heart: RRR,  no murmur.  no pretibial edema bilaterally  Abdomen:  Not distended, soft, non-tender. No rebound or rigidity. Normal bowel sounds Skin: Not pale. Not jaundice Neurologic:  alert & oriented X3.  Speech normal, gait appropriate for  age and unassisted Psych--  Cognition and judgment appear intact.  Cooperative with normal attention span and concentration.  Behavior appropriate. No anxious or depressed appearing.    Assessment & Plan:   Assessment H/o Anxiety H/o Palpitations 2010, saw cards, workup (-) per pt BCC nose 2016 Vitreous detachment 2011  PLAN: Abdominal discomfort: Sx are on and off for 1.5 years. [place] 11-2014 was normal, pancreas not well visualized. Labs normal. Saw GI, they Rx EGD, patient had to cancel it. Sx have resurface along with a ill-defined suprapubic discomfort. Exam normal. Plan: CMP, CBC, UA, lipase. Refer back to GI for consideration of EGD. Will also consider a CT of the abdomen and pelvis. RTC: Due for a CPX, recommend to reschedule it at his convenience.

## 2016-06-15 LAB — CBC WITH DIFFERENTIAL/PLATELET
Basophils Absolute: 0 10*3/uL (ref 0.0–0.1)
Basophils Relative: 0.3 % (ref 0.0–3.0)
Eosinophils Absolute: 0.2 10*3/uL (ref 0.0–0.7)
Eosinophils Relative: 2.5 % (ref 0.0–5.0)
HCT: 40.6 % (ref 39.0–52.0)
HEMOGLOBIN: 13.7 g/dL (ref 13.0–17.0)
LYMPHS ABS: 1.7 10*3/uL (ref 0.7–4.0)
Lymphocytes Relative: 25.1 % (ref 12.0–46.0)
MCHC: 33.8 g/dL (ref 30.0–36.0)
MCV: 92.5 fl (ref 78.0–100.0)
MONOS PCT: 8.5 % (ref 3.0–12.0)
Monocytes Absolute: 0.6 10*3/uL (ref 0.1–1.0)
Neutro Abs: 4.2 10*3/uL (ref 1.4–7.7)
Neutrophils Relative %: 63.6 % (ref 43.0–77.0)
Platelets: 194 10*3/uL (ref 150.0–400.0)
RBC: 4.39 Mil/uL (ref 4.22–5.81)
RDW: 12.6 % (ref 11.5–15.5)
WBC: 6.7 10*3/uL (ref 4.0–10.5)

## 2016-06-15 LAB — COMPREHENSIVE METABOLIC PANEL
ALK PHOS: 86 U/L (ref 39–117)
ALT: 15 U/L (ref 0–53)
AST: 17 U/L (ref 0–37)
Albumin: 4 g/dL (ref 3.5–5.2)
BUN: 12 mg/dL (ref 6–23)
CO2: 33 mEq/L — ABNORMAL HIGH (ref 19–32)
Calcium: 9 mg/dL (ref 8.4–10.5)
Chloride: 103 mEq/L (ref 96–112)
Creatinine, Ser: 1.28 mg/dL (ref 0.40–1.50)
GFR: 61.31 mL/min (ref >60.00)
GLUCOSE: 100 mg/dL — AB (ref 70–99)
POTASSIUM: 4.7 meq/L (ref 3.5–5.1)
SODIUM: 141 meq/L (ref 135–145)
TOTAL PROTEIN: 6.5 g/dL (ref 6.0–8.3)
Total Bilirubin: 0.6 mg/dL (ref 0.2–1.2)

## 2016-06-15 LAB — URINALYSIS, ROUTINE W REFLEX MICROSCOPIC
Bilirubin Urine: NEGATIVE
HGB URINE DIPSTICK: NEGATIVE
KETONES UR: NEGATIVE
LEUKOCYTES UA: NEGATIVE
NITRITE: NEGATIVE
RBC / HPF: NONE SEEN (ref 0–?)
Specific Gravity, Urine: 1.015 (ref 1.000–1.030)
TOTAL PROTEIN, URINE-UPE24: NEGATIVE
URINE GLUCOSE: NEGATIVE
UROBILINOGEN UA: 0.2 (ref 0.0–1.0)
WBC UA: NONE SEEN (ref 0–?)
pH: 7 (ref 5.0–8.0)

## 2016-06-15 LAB — LIPASE: LIPASE: 44 U/L (ref 11.0–59.0)

## 2016-06-15 NOTE — Assessment & Plan Note (Signed)
PLAN: Abdominal discomfort: Sx are on and off for 1.5 years. [place] 11-2014 was normal, pancreas not well visualized. Labs normal. Saw GI, they Rx EGD, patient had to cancel it. Sx have resurface along with a ill-defined suprapubic discomfort. Exam normal. Plan: CMP, CBC, UA, lipase. Refer back to GI for consideration of EGD. Will also consider a CT of the abdomen and pelvis. RTC: Due for a CPX, recommend to reschedule it at his convenience.

## 2016-06-20 ENCOUNTER — Encounter: Payer: Self-pay | Admitting: Physician Assistant

## 2016-06-20 ENCOUNTER — Ambulatory Visit (INDEPENDENT_AMBULATORY_CARE_PROVIDER_SITE_OTHER): Payer: 59 | Admitting: Physician Assistant

## 2016-06-20 VITALS — BP 110/70 | HR 69 | Ht 70.0 in | Wt 161.0 lb

## 2016-06-20 DIAGNOSIS — Z86010 Personal history of colonic polyps: Secondary | ICD-10-CM | POA: Diagnosis not present

## 2016-06-20 DIAGNOSIS — R1084 Generalized abdominal pain: Secondary | ICD-10-CM

## 2016-06-20 DIAGNOSIS — R194 Change in bowel habit: Secondary | ICD-10-CM

## 2016-06-20 MED ORDER — NA SULFATE-K SULFATE-MG SULF 17.5-3.13-1.6 GM/177ML PO SOLN: 1.0000 | Freq: Once | ORAL | 0 refills | Status: AC

## 2016-06-20 NOTE — Patient Instructions (Signed)

## 2016-06-20 NOTE — Progress Notes (Signed)
Subjective:    Patient ID: Bradley Meyer, male    DOB: Apr 08, 1956, 60 y.o.   MRN: [id number]  HPI Bradley Meyer is a pleasant 60 year old white male known to Dr. Ardis Hughs who is referred today by Dr. Larose Kells for evaluation of upper abdominal pain. Patient says he has had intermittent symptoms over the past 1-1/2 years but that he has had much more consistent symptoms over the past 4 months. He says the discomfort is more on then off over the past few months. He describes this as a pressure punching type dull discomfort in his mid abdomen. This is not necessarily aggravated by eating but sometimes seems to be worse after eating. He is not aware of any food intolerances. Not found any in particular that helps is the symptoms. He has having regular bowel movements says perhaps looser stools and a bit more frequency over the past few months no melena or hematochezia. His appetite is fine his weight has been stable no nausea or vomiting. He is not on any regular medicines not denies any regular aspirin or NSAID use. He does occasionally have some nocturnal reflux type symptoms. Recent labs done 06/14/2016 CBC , CMET and lipase all unremarkable. Upper abdominal ultrasound was done in 2016 and negative Last colonoscopy October 2012 done for history of adenomatous colon polyps was noted to have mild diverticulosis and a diminutive transverse colon polyp was removed did not have any adenomatous changes. He is due for follow-up colonoscopy. No prior EGD.  Review of Systems Pertinent positive and negative review of systems were noted in the above HPI section.  All other review of systems was otherwise negative.  Outpatient Encounter Prescriptions as of 06/20/2016  Medication Sig  . Na Sulfate-K Sulfate-Mg Sulf 17.5-3.13-1.6 GM/180ML SOLN Take 1 kit by mouth once.   No facility-administered encounter medications on file as of 06/20/2016.    No Known Allergies Patient Active Problem List   Diagnosis Date Noted  . PCP  NOTES >>> 07/07/2015  . Dysphagia 11/10/2014  . BCC (basal cell carcinoma of skin) 07/01/2014  . Strain of left trapezius muscle 05/20/2014  . Annual physical exam 02/08/2013   Social History   Social History  . Marital status: Married    Spouse name: N/A  . Number of children: 0  . Years of education: N/A   Occupational History  . Art gallery manager  [employer]   Social History Main Topics  . Smoking status: Former Smoker    Packs/day: 0.25    Years: 12.00    Types: Cigarettes    Quit date: 07/01/2007  . Smokeless tobacco: Never Used  . Alcohol use 0.6 oz/week    1 Cans of beer per week     Comment: Occassionally  . Drug use: No  . Sexual activity: Not on file   Other Topics Concern  . Not on file   Social History Narrative   moved here from [place] 10-2008     Occupation: Art gallery manager     Married , no children          Bradley Meyer's family history includes CAD in his other; Colon polyps in his mother; Prostate cancer (age of onset: 9) in his father.      Objective:    Vitals:   06/20/16 1505  BP: 110/70  Pulse: 69    Physical Exam Well-developed white male in no acute distress, pleasant blood pressure 110/70 pulse 69 height 5 foot 10 weight 161 BMI 23.1. HEENT; nontraumatic normocephalic EOMI  PERRLA sclera anicteric, Cardiovascular; regular rate and rhythm with S1-S2 no murmur or gallop, Pulmonary; clear bilaterally, Abdomen ;soft there's no focal tenderness no guarding or rebound no palpable mass or hepatosplenomegaly bowel sounds are present no bruit, Rectal ;exam not done, Extremities; no clubbing cyanosis or edema skin warm and dry, Neuropsych ;mood and affect appropriate      Assessment & Plan:   #39 60 year old white male with 1-1/2 year symptoms of intermittent mid-to upper abdominal discomfort, more prevalent over the past 4 months and described as a dull pressure-type discomfort. Etiology is not clear, rule out peptic ulcer disease,  gastropathy, H. pylori ,IBD,or other intra-abdominal inflammatory process #2 history of adenomatous colon polyps due for follow-up colonoscopy  Plan; Will schedule for upper endoscopy and colonoscopy with Dr. Ardis Hughs. Both procedures discussed in detail with patient and he is agreeable to proceed. If EGD and colonoscopy are unrevealing will need CT of the abdomen and pelvis with contrast.  Bradley Meyer Genia Harold PA-C 06/20/2016   Cc: Colon Branch, MD

## 2016-06-21 ENCOUNTER — Ambulatory Visit: Payer: Self-pay | Admitting: Internal Medicine

## 2016-06-21 NOTE — Progress Notes (Signed)
I agree with the above note, plan 

## 2016-07-03 HISTORY — PX: UPPER GI ENDOSCOPY: SHX6162

## 2016-07-03 HISTORY — PX: COLONOSCOPY: SHX174

## 2016-07-05 ENCOUNTER — Encounter: Payer: 59 | Admitting: Internal Medicine

## 2016-07-21 ENCOUNTER — Encounter: Payer: Self-pay | Admitting: Gastroenterology

## 2016-07-22 HISTORY — PX: POLYPECTOMY: SHX149

## 2016-07-29 ENCOUNTER — Encounter: Payer: Self-pay | Admitting: Gastroenterology

## 2016-07-29 ENCOUNTER — Ambulatory Visit (AMBULATORY_SURGERY_CENTER): Payer: 59 | Admitting: Gastroenterology

## 2016-07-29 VITALS — BP 92/69 | HR 63 | Temp 98.2°F | Resp 22 | Ht 70.0 in | Wt 161.0 lb

## 2016-07-29 DIAGNOSIS — K635 Polyp of colon: Secondary | ICD-10-CM

## 2016-07-29 DIAGNOSIS — Z86010 Personal history of colonic polyps: Secondary | ICD-10-CM

## 2016-07-29 DIAGNOSIS — R195 Other fecal abnormalities: Secondary | ICD-10-CM

## 2016-07-29 DIAGNOSIS — K2990 Gastroduodenitis, unspecified, without bleeding: Secondary | ICD-10-CM | POA: Diagnosis not present

## 2016-07-29 DIAGNOSIS — K6389 Other specified diseases of intestine: Secondary | ICD-10-CM | POA: Diagnosis not present

## 2016-07-29 DIAGNOSIS — K2970 Gastritis, unspecified, without bleeding: Secondary | ICD-10-CM | POA: Diagnosis not present

## 2016-07-29 DIAGNOSIS — R1084 Generalized abdominal pain: Secondary | ICD-10-CM

## 2016-07-29 DIAGNOSIS — K5730 Diverticulosis of large intestine without perforation or abscess without bleeding: Secondary | ICD-10-CM | POA: Diagnosis not present

## 2016-07-29 DIAGNOSIS — D122 Benign neoplasm of ascending colon: Secondary | ICD-10-CM

## 2016-07-29 MED ORDER — SODIUM CHLORIDE 0.9 % IV SOLN: 500.0000 mL | INTRAVENOUS | Status: DC

## 2016-07-29 NOTE — Op Note (Signed)
[place] Patient Name: Bradley Meyer Procedure Date: 07/29/2016 1:35 PM MRN: [id number] Endoscopist: Milus Banister , MD Age: 60 Referring MD:  Date of Birth: 07-15-1956 Gender: Male Account #: [id number] Procedure:                Upper GI endoscopy Indications:              Generalized abdominal pain Medicines:                Monitored Anesthesia Care Procedure:                Pre-Anesthesia Assessment:                           - Prior to the procedure, a History and Physical                            was performed, and patient medications and                            allergies were reviewed. The patient's tolerance of                            previous anesthesia was also reviewed. The risks                            and benefits of the procedure and the sedation                            options and risks were discussed with the patient.                            All questions were answered, and informed consent                            was obtained. Prior Anticoagulants: The patient has                            taken no previous anticoagulant or antiplatelet                            agents. ASA Grade Assessment: II - A patient with                            mild systemic disease. After reviewing the risks                            and benefits, the patient was deemed in                            satisfactory condition to undergo the procedure.                           After obtaining informed consent, the endoscope was  passed under direct vision. Throughout the                            procedure, the patient's blood pressure, pulse, and                            oxygen saturations were monitored continuously. The                            Model GIF-[id number]) scope was introduced                            through the mouth, and advanced to the second part                            of duodenum. The upper GI  endoscopy was                            accomplished without difficulty. The patient                            tolerated the procedure well. Scope In: Scope Out: Findings:                 The esophagus was normal.                           Mild inflammation characterized by erythema was                            found in the gastric antrum. Biopsies were taken                            with a cold forceps for histology.                           The examined duodenum was normal. Complications:            No immediate complications. Estimated blood loss:                            None. Estimated Blood Loss:     Estimated blood loss: none. Impression:               - Normal esophagus.                           - Gastritis. Biopsied.                           - Normal examined duodenum. Recommendation:           - Patient has a contact number available for                            emergencies. The signs and symptoms of potential  delayed complications were discussed with the                            patient. Return to normal activities tomorrow.                            Written discharge instructions were provided to the                            patient.                           - Resume previous diet.                           - Continue present medications.                           - Await pathology results. If the biopsies from                            this exam and the biopsies from your colonoscopy                            fail to show a cause of your abdominal pains, the                            next test in workup is CT scan. Milus Banister, MD 07/29/2016 2:14:32 PM This report has been signed electronically.

## 2016-07-29 NOTE — Op Note (Signed)
[facility] Patient Name: Bradley Meyer Procedure Date: 07/29/2016 1:35 PM MRN: [id number] Endoscopist: Milus Banister , MD Age: 60 Referring MD:  Date of Birth: [birth year] Gender: Male Account #: [id number] Procedure:                Colonoscopy Indications:              High risk colon cancer surveillance: Personal                            history of colonic polyps; 2009 colonoscopy in [place]                            found 81mm TA; colonoscopy Dr. Ardis Hughs 2012 found no                            adenomatous polyps; chronic loose stools Medicines:                Monitored Anesthesia Care Procedure:                Pre-Anesthesia Assessment:                           - Prior to the procedure, a History and Physical                            was performed, and patient medications and                            allergies were reviewed. The patient's tolerance of                            previous anesthesia was also reviewed. The risks                            and benefits of the procedure and the sedation                            options and risks were discussed with the patient.                            All questions were answered, and informed consent                            was obtained. Prior Anticoagulants: The patient has                            taken no previous anticoagulant or antiplatelet                            agents. ASA Grade Assessment: II - A patient with                            mild systemic disease. After reviewing the risks  and benefits, the patient was deemed in                            satisfactory condition to undergo the procedure.                           After obtaining informed consent, the colonoscope                            was passed under direct vision. Throughout the                            procedure, the patient's blood pressure, pulse, and                            oxygen saturations  were monitored continuously. The                            Model CF-HQ190L [id number]) scope was introduced                            through the anus and advanced to the the terminal                            ileum. The colonoscopy was performed without                            difficulty. The patient tolerated the procedure                            well. The quality of the bowel preparation was                            good. The terminal ileum, ileocecal valve,                            appendiceal orifice, and rectum were photographed. Scope In: 1:45:57 PM Scope Out: 1:57:38 PM Scope Withdrawal Time: 0 hours 8 minutes 46 seconds  Total Procedure Duration: 0 hours 11 minutes 41 seconds  Findings:                 The terminal ileum appeared normal.                           Two sessile polyps were found in the ascending                            colon. The polyps were 2 to 3 mm in size. These                            polyps were removed with a cold snare. Resection                            and retrieval were complete.  A few small-mouthed diverticula were found in the                            left colon.                           Random colon biopsies for histology were taken with                            a cold forceps for evaluation of microscopic                            colitis.                           The exam was otherwise without abnormality on                            direct and retroflexion views. Complications:            No immediate complications. Estimated blood loss:                            None. Estimated Blood Loss:     Estimated blood loss: none. Impression:               - The examined portion of the ileum was normal.                           - Two 2 to 3 mm polyps in the ascending colon,                            removed with a cold snare. Resected and retrieved.                           - Diverticulosis in the  left colon.                           - Colon was randomly biopsied (for chronic loose                            stools)                           - The examination was otherwise normal on direct                            and retroflexion views. Recommendation:           - Patient has a contact number available for                            emergencies. The signs and symptoms of potential                            delayed complications were discussed with the  patient. Return to normal activities tomorrow.                            Written discharge instructions were provided to the                            patient.                           - Resume previous diet.                           - Continue present medications.                           You will receive a letter within 2-3 weeks with the                            pathology results and my final recommendations.                           If the polyp(s) is proven to be 'pre-cancerous' on                            pathology, you will need repeat colonoscopy in 5                            years. If the polyp(s) is NOT 'precancerous' on                            pathology then you should repeat colon cancer                            screening in 10 years with colonoscopy without need                            for colon cancer screening by any method prior to                            then (including stool testing). Milus Banister, MD 07/29/2016 2:12:08 PM This report has been signed electronically.

## 2016-07-29 NOTE — Patient Instructions (Signed)
YOU HAD AN ENDOSCOPIC PROCEDURE TODAY AT [facility]:   Refer to the procedure report that was given to you for any specific questions about what was found during the examination.  If the procedure report does not answer your questions, please call your gastroenterologist to clarify.  If you requested that your care partner not be given the details of your procedure findings, then the procedure report has been included in a sealed envelope for you to review at your convenience later.  YOU SHOULD EXPECT: Some feelings of bloating in the abdomen. Passage of more gas than usual.  Walking can help get rid of the air that was put into your GI tract during the procedure and reduce the bloating. If you had a lower endoscopy (such as a colonoscopy or flexible sigmoidoscopy) you may notice spotting of blood in your stool or on the toilet paper. If you underwent a bowel prep for your procedure, you may not have a normal bowel movement for a few days.  Please Note:  You might notice some irritation and congestion in your nose or some drainage.  This is from the oxygen used during your procedure.  There is no need for concern and it should clear up in a day or so.  SYMPTOMS TO REPORT IMMEDIATELY:   Following lower endoscopy (colonoscopy or flexible sigmoidoscopy):  Excessive amounts of blood in the stool  Significant tenderness or worsening of abdominal pains  Swelling of the abdomen that is new, acute  Fever of 100F or higher   Following upper endoscopy (EGD)  Vomiting of blood or coffee ground material  New chest pain or pain under the shoulder blades  Painful or persistently difficult swallowing  New shortness of breath  Fever of 100F or higher  Black, tarry-looking stools  For urgent or emergent issues, a gastroenterologist can be reached at any hour by calling [phone].   DIET:  We do recommend a small meal at first, but then you may proceed to your regular diet.  Drink  plenty of fluids but you should avoid alcoholic beverages for 24 hours. Try to increase the fiber in your diet, and drink plenty of water.  ACTIVITY:  You should plan to take it easy for the rest of today and you should NOT DRIVE or use heavy machinery until tomorrow (because of the sedation medicines used during the test).    FOLLOW UP: Our staff will call the number listed on your records the next business day following your procedure to check on you and address any questions or concerns that you may have regarding the information given to you following your procedure. If we do not reach you, we will leave a message.  However, if you are feeling well and you are not experiencing any problems, there is no need to return our call.  We will assume that you have returned to your regular daily activities without incident.  If any biopsies were taken you will be contacted by phone or by letter within the next 1-3 weeks.  Please call us at [phone] if you have not heard about the biopsies in 3 weeks.    SIGNATURES/CONFIDENTIALITY: You and/or your care partner have signed paperwork which will be entered into your electronic medical record.  These signatures attest to the fact that that the information above on your After Visit Summary has been reviewed and is understood.  Full responsibility of the confidentiality of this discharge information lies with you and/or your  care-partner.  Read all of the handouts given to you by your recovery room nurse.    We will call if the pathology reveals an h pylori infection in your stomach.   Thank-you for choosing [place] for your healthcare needs today.

## 2016-07-29 NOTE — Progress Notes (Signed)
Called to room to assist during endoscopic procedure.  Patient ID and intended procedure confirmed with present staff. Received instructions for my participation in the procedure from the performing physician.  

## 2016-07-29 NOTE — Progress Notes (Signed)
Patient awakening,vss,report to rn 

## 2016-08-01 ENCOUNTER — Telehealth: Payer: Self-pay

## 2016-08-01 NOTE — Telephone Encounter (Signed)
  Follow up Call-  Call back number 07/29/2016  Post procedure Call Back phone  # [phone]  Permission to leave phone message Yes  Some recent data might be hidden     Patient questions:  Do you have a fever, pain , or abdominal swelling? No. Pain Score  0 *  Have you tolerated food without any problems? Yes.    Have you been able to return to your normal activities? Yes.    Do you have any questions about your discharge instructions: Diet   No. Medications  No. Follow up visit  No.  Do you have questions or concerns about your Care? No.  Actions: * If pain score is 4 or above: No action needed, pain <4.

## 2016-08-01 NOTE — Telephone Encounter (Signed)
Left message on answering machine. 

## 2016-08-09 ENCOUNTER — Other Ambulatory Visit: Payer: Self-pay

## 2016-08-09 DIAGNOSIS — R1084 Generalized abdominal pain: Secondary | ICD-10-CM

## 2016-08-19 ENCOUNTER — Ambulatory Visit (INDEPENDENT_AMBULATORY_CARE_PROVIDER_SITE_OTHER)
Admission: RE | Admit: 2016-08-19 | Discharge: 2016-08-19 | Disposition: A | Discharge status: Home / Self Care | Payer: 59 | Source: Ambulatory Visit | Attending: Gastroenterology | Admitting: Gastroenterology

## 2016-08-19 DIAGNOSIS — R1084 Generalized abdominal pain: Secondary | ICD-10-CM

## 2016-08-19 MED ORDER — IOPAMIDOL (ISOVUE-300) INJECTION 61%
100.0000 mL | Freq: Once | INTRAVENOUS | Status: AC | PRN
  Administered 2016-08-19: 100 mL via INTRAVENOUS

## 2016-08-31 ENCOUNTER — Encounter: Payer: Self-pay | Admitting: Internal Medicine

## 2016-08-31 ENCOUNTER — Ambulatory Visit (INDEPENDENT_AMBULATORY_CARE_PROVIDER_SITE_OTHER): Payer: 59 | Admitting: Internal Medicine

## 2016-08-31 VITALS — BP 108/74 | HR 71 | Temp 98.1°F | Resp 14 | Ht 70.0 in | Wt 160.2 lb

## 2016-08-31 DIAGNOSIS — Z0000 Encounter for general adult medical examination without abnormal findings: Secondary | ICD-10-CM | POA: Diagnosis not present

## 2016-08-31 MED ORDER — HYOSCYAMINE SULFATE 0.125 MG PO TABS: 0.1250 mg | ORAL_TABLET | Freq: Two times a day (BID) | ORAL | 0 refills | Status: DC | PRN

## 2016-08-31 NOTE — Progress Notes (Signed)
Subjective:    Patient ID: Bradley Meyer, male    DOB: 09/26/1956, 59 y.o.   MRN: [id number]  DOS:  08/31/2016 Type of visit - description : CPX Interval history: Since the last visit, GI workup was completed, he still has on and off symptoms   Review of Systems  Constitutional: No fever. No chills. No unexplained wt changes. No unusual sweats  HEENT: No dental problems, no ear discharge, no facial swelling, no voice changes. No eye discharge, no eye  redness , no  intolerance to light   Respiratory: No wheezing , no  difficulty breathing. No cough , no mucus production  Cardiovascular: No CP, no leg swelling , no  Palpitations  GI: Still has on and off symptoms, see last office visit No blood in the stools. No dysphagia, no odynophagia    Endocrine: No polyphagia, no polyuria , no polydipsia  GU: No dysuria, gross hematuria, difficulty urinating. No urinary urgency, no frequency.  Musculoskeletal: No joint swellings or unusual aches or pains  Skin: No change in the color of the skin, palor , no  Rash  Allergic, immunologic: No environmental allergies , no  food allergies  Neurological: No dizziness no  syncope. No headaches. No diplopia, no slurred, no slurred speech, no motor deficits, no facial  Numbness  Hematological: No enlarged lymph nodes, no easy bruising , no unusual bleedings  Psychiatry: No suicidal ideas, no hallucinations, no beavior problems, no confusion.  No unusual/severe anxiety, no depression  Past Medical History:  Diagnosis Date  . BCC (basal cell carcinoma of skin)    nose 2006  . Colon polyp   . Kidney stones   . Palpitation     2010, saw [facility] (w/u per patient negative)    . Vitreous detachment 05-2010    Past Surgical History:  Procedure Laterality Date  . APPENDECTOMY  1971  . KNEE ARTHROSCOPY  2002   right  . POLYPECTOMY      Social History   Social History  . Marital status: Married    Spouse name: N/A  .  Number of children: 0  . Years of education: N/A   Occupational History  . Art gallery manager     Social History Main Topics  . Smoking status: Former Smoker    Packs/day: 0.25    Years: 12.00    Types: Cigarettes    Quit date: 07/01/2007  . Smokeless tobacco: Never Used  . Alcohol use 0.6 oz/week    1 Cans of beer per week     Comment: Occassionally  . Drug use: No  . Sexual activity: Not on file   Other Topics Concern  . Not on file   Social History Narrative   moved here from [place] 10-2008     Occupation: Art gallery manager     Married , no children           Family History  Problem Relation Age of Onset  . Prostate cancer Father 16  . CAD Other     uncle, in his 45s  . Colon polyps Mother   . Colon cancer Neg Hx   . Stomach cancer Neg Hx   . Diabetes Neg Hx   . Kidney disease Neg Hx   . Gallbladder disease Neg Hx   . Esophageal cancer Neg Hx   . Rectal cancer Neg Hx        Medication List       Accurate as of 08/31/16 11:59 PM.  Always use your most recent med list.          hyoscyamine 0.125 MG tablet Commonly known as:  LEVSIN Take 1-2 tablets (0.125-0.25 mg total) by mouth 2 (two) times daily as needed.          Objective:   Physical Exam BP 108/74 (BP Location: Left Arm, Patient Position: Sitting, Cuff Size: Normal)   Pulse 71   Temp 98.1 F (36.7 C) (Oral)   Resp 14   Ht 5\' 10"  (1.778 m)   Wt 160 lb 4 oz (72.7 kg)   SpO2 98%   BMI 22.99 kg/m   General:   Well developed, well nourished . NAD.  Neck: No  thyromegaly  HEENT:  Normocephalic . Face symmetric, atraumatic Lungs:  CTA B Normal respiratory effort, no intercostal retractions, no accessory muscle use. Heart: RRR,  no murmur.  No pretibial edema bilaterally  Abdomen:  Not distended, soft, non-tender. No rebound or rigidity.   Skin: Exposed areas without rash. Not pale. Not jaundice Neurologic:  alert & oriented X3.  Speech normal, gait appropriate for age and  unassisted Strength symmetric and appropriate for age.  Psych: Cognition and judgment appear intact.  Cooperative with normal attention span and concentration.  Behavior appropriate. No anxious or depressed appearing.    Assessment & Plan:    Assessment H/o Anxiety H/o Palpitations 2010, saw cards, workup (-) per pt BCC nose 2016 Vitreous detachment 2011  PLAN: Abdominal pain: Labs were normal. Saw GI, had a EGD and colonoscopy 07-2016, both negative, biopsies negative. CT abdomen and pelvis 2017  esentially negative. Still sx  on and off. Recommend a trial with levsin  (spasmodic pain?). If sx increase or he is still concerned, we could get a second opinion or  HIDA scan?. RTC  one year.

## 2016-08-31 NOTE — Patient Instructions (Signed)
  GO TO THE FRONT DESK Schedule your next appointment for a  Physical exam by 08-2017  Schedule labs to be done this week, fasting  Take Levsin, one or 2 tablets as needed for stomach pain. If you have different or increasing symptoms let us know

## 2016-08-31 NOTE — Assessment & Plan Note (Addendum)
Td 2016 ; zostavax discussed before , had a flu shot CCS: [place]random biopsies were normal, terminal ileum was normal appearing and normal by biopsy, 58mm pedunculated TVA was removed from rectum Colonoscopy 07/2011--- next 5 years cscope 07-2016 (-), 10 years Prostate cancer screening: DRE 2015 negative, PSA was normal. Reassess next year Labs reviewed, due for a FLP. Has a healthy lifestyle.

## 2016-08-31 NOTE — Progress Notes (Signed)
Pre visit review using our clinic review tool, if applicable. No additional management support is needed unless otherwise documented below in the visit note. 

## 2016-09-01 ENCOUNTER — Other Ambulatory Visit (INDEPENDENT_AMBULATORY_CARE_PROVIDER_SITE_OTHER): Payer: 59

## 2016-09-01 DIAGNOSIS — Z0000 Encounter for general adult medical examination without abnormal findings: Secondary | ICD-10-CM

## 2016-09-01 LAB — LIPID PANEL
CHOL/HDL RATIO: 2
Cholesterol: 153 mg/dL (ref 0–200)
HDL: 65.1 mg/dL (ref >39.00)
LDL CALC: 75 mg/dL (ref 0–99)
NONHDL: 87.68
TRIGLYCERIDES: 64 mg/dL (ref 0.0–149.0)
VLDL: 12.8 mg/dL (ref 0.0–40.0)

## 2016-09-01 NOTE — Assessment & Plan Note (Signed)
Abdominal pain: Labs were normal. Saw GI, had a EGD and colonoscopy 07-2016, both negative, biopsies negative. CT abdomen and pelvis 2017  esentially negative. Still sx  on and off. Recommend a trial with levsin  (spasmodic pain?). If sx increase or he is still concerned, we could get a second opinion or  HIDA scan?. RTC  one year.

## 2017-05-10 ENCOUNTER — Encounter: Payer: Self-pay | Admitting: Internal Medicine

## 2017-05-10 ENCOUNTER — Ambulatory Visit (INDEPENDENT_AMBULATORY_CARE_PROVIDER_SITE_OTHER): Payer: 59 | Admitting: Internal Medicine

## 2017-05-10 VITALS — BP 126/66 | HR 65 | Temp 98.1°F | Resp 14 | Ht 70.0 in | Wt 156.2 lb

## 2017-05-10 DIAGNOSIS — R1084 Generalized abdominal pain: Secondary | ICD-10-CM

## 2017-05-10 NOTE — Progress Notes (Signed)
Pre visit review using our clinic review tool, if applicable. No additional management support is needed unless otherwise documented below in the visit note. 

## 2017-05-11 ENCOUNTER — Encounter: Payer: Self-pay | Admitting: Internal Medicine

## 2017-05-11 DIAGNOSIS — Z08 Encounter for follow-up examination after completed treatment for malignant neoplasm: Secondary | ICD-10-CM | POA: Diagnosis not present

## 2017-05-11 DIAGNOSIS — Z85828 Personal history of other malignant neoplasm of skin: Secondary | ICD-10-CM | POA: Diagnosis not present

## 2017-05-11 DIAGNOSIS — L570 Actinic keratosis: Secondary | ICD-10-CM | POA: Diagnosis not present

## 2017-05-11 DIAGNOSIS — D225 Melanocytic nevi of trunk: Secondary | ICD-10-CM | POA: Diagnosis not present

## 2017-05-11 LAB — CBC WITH DIFFERENTIAL/PLATELET
BASOS PCT: 1.3 % (ref 0.0–3.0)
Basophils Absolute: 0.1 10*3/uL (ref 0.0–0.1)
EOS ABS: 0.1 10*3/uL (ref 0.0–0.7)
Eosinophils Relative: 1 % (ref 0.0–5.0)
HEMATOCRIT: 40.5 % (ref 39.0–52.0)
Hemoglobin: 13.5 g/dL (ref 13.0–17.0)
LYMPHS ABS: 1.7 10*3/uL (ref 0.7–4.0)
LYMPHS PCT: 28.8 % (ref 12.0–46.0)
MCHC: 33.2 g/dL (ref 30.0–36.0)
MCV: 95.6 fl (ref 78.0–100.0)
MONO ABS: 0.4 10*3/uL (ref 0.1–1.0)
Monocytes Relative: 7.7 % (ref 3.0–12.0)
NEUTROS ABS: 3.6 10*3/uL (ref 1.4–7.7)
NEUTROS PCT: 61.2 % (ref 43.0–77.0)
PLATELETS: 183 10*3/uL (ref 150.0–400.0)
RBC: 4.23 Mil/uL (ref 4.22–5.81)
RDW: 12.7 % (ref 11.5–15.5)
WBC: 5.8 10*3/uL (ref 4.0–10.5)

## 2017-05-11 LAB — AMYLASE: AMYLASE: 65 U/L (ref 27–131)

## 2017-05-11 LAB — COMPREHENSIVE METABOLIC PANEL
ALT: 10 U/L (ref 0–53)
AST: 15 U/L (ref 0–37)
Albumin: 4.2 g/dL (ref 3.5–5.2)
Alkaline Phosphatase: 69 U/L (ref 39–117)
BILIRUBIN TOTAL: 0.8 mg/dL (ref 0.2–1.2)
BUN: 17 mg/dL (ref 6–23)
CHLORIDE: 103 meq/L (ref 96–112)
CO2: 31 meq/L (ref 19–32)
CREATININE: 1.24 mg/dL (ref 0.40–1.50)
Calcium: 9.2 mg/dL (ref 8.4–10.5)
GFR: 63.4 mL/min (ref >60.00)
GLUCOSE: 150 mg/dL — AB (ref 70–99)
Potassium: 3.9 mEq/L (ref 3.5–5.1)
SODIUM: 139 meq/L (ref 135–145)
Total Protein: 6.3 g/dL (ref 6.0–8.3)

## 2017-05-11 LAB — TSH: TSH: 1.17 u[IU]/mL (ref 0.35–4.50)

## 2017-05-11 LAB — LIPASE: Lipase: 41 U/L (ref 11.0–59.0)

## 2017-05-11 NOTE — Addendum Note (Signed)
Addended byDamita Dunnings D on: 05/11/2017 04:56 PM   Modules accepted: Orders

## 2017-05-11 NOTE — Progress Notes (Signed)
Subjective:    Patient ID: Bradley Meyer, male    DOB: [birth year], 61 y.o.   MRN: [id number]  DOS:  05/10/2017 Type of visit - description : f/u Interval history: Patient presents with abdominal pain. Pain actually started about 2-1/2 years ago, he had extensive workup before with no clear results. Symptoms are on and off but more noticeable for the last few months: has a constant abdominal pain at the epigastric area "like something is there", "tight", occasionally burning. Pain last all day, increase when he sits down, does not increase with food except when he has breakfast.  Wt Readings from Last 3 Encounters:  05/10/17 156 lb 4 oz (70.9 kg)  08/31/16 160 lb 4 oz (72.7 kg)  07/29/16 161 lb (73 kg)      Review of Systems  No blood in the stools, stools are loose for about 2 years, no hematochezia. Occasional heartburn but only with certain foods.  No dysphagia or odynophagia. no anxiety or depression. Dtress is at baseline. Some weight loss but he thinks because he cycling   harder and longer.  Past Medical History:  Diagnosis Date  . BCC (basal cell carcinoma of skin)    nose 2006  . Colon polyp   . Kidney stones   . Palpitation     2010, saw [facility] (w/u per patient negative)    . Vitreous detachment 05-2010    Past Surgical History:  Procedure Laterality Date  . APPENDECTOMY  1971  . KNEE ARTHROSCOPY  2002   right  . POLYPECTOMY      Social History   Social History  . Marital status: Married    Spouse name: N/A  . Number of children: 0  . Years of education: N/A   Occupational History  . Art gallery manager     Social History Main Topics  . Smoking status: Former Smoker    Packs/day: 0.25    Years: 12.00    Types: Cigarettes    Quit date: 07/01/2007  . Smokeless tobacco: Never Used  . Alcohol use 0.6 oz/week    1 Cans of beer per week     Comment: Occassionally  . Drug use: No  . Sexual activity: Not on file   Other Topics  Concern  . Not on file   Social History Narrative   moved here from [place] 10-2008     Occupation: Art gallery manager     Married , no children            Allergies as of 05/10/2017   No Known Allergies     Medication List       Accurate as of 05/10/17 11:59 PM. Always use your most recent med list.          hyoscyamine 0.125 MG tablet Commonly known as:  LEVSIN Take 1-2 tablets (0.125-0.25 mg total) by mouth 2 (two) times daily as needed.          Objective:   Physical Exam BP 126/66 (BP Location: Left Arm, Patient Position: Sitting, Cuff Size: Small)   Pulse 65   Temp 98.1 F (36.7 C) (Oral)   Resp 14   Ht 5\' 10"  (1.778 m)   Wt 156 lb 4 oz (70.9 kg)   SpO2 97%   BMI 22.42 kg/m  General:   Well developed, well nourished . NAD.  HEENT:  Normocephalic . Face symmetric, atraumatic Abdomen: Not distended, soft, nontender  Skin: Not pale. Not jaundice Neurologic:  alert &  oriented X3.  Speech normal, gait appropriate for age and unassisted Psych--  Cognition and judgment appear intact.  Cooperative with normal attention span and concentration.  Behavior appropriate. No anxious or depressed appearing.      Assessment & Plan:   Assessment H/o Anxiety H/o Palpitations 2010, saw cards, workup (-) per pt BCC nose 2016 Vitreous detachment 2011  PLAN: Abdominal pain: Going on for 2-1/2 years. Previously symptoms did not improve with her gluten-free diet. Already seen by GI, EGD, colonoscopy, biopsies, CT abdomen and pelvis were unrevealing. Etiology unclear, IBS? Will get a CMP, CBC, TSH, amy and lipase. He would like a second opinion, refer to [facility]? Visit took place when the system was down

## 2017-05-11 NOTE — Assessment & Plan Note (Signed)
Abdominal pain: Going on for 2-1/2 years. Previously symptoms did not improve with her gluten-free diet. Already seen by GI, EGD, colonoscopy, biopsies, CT abdomen and pelvis were unrevealing. Etiology unclear, IBS? Will get a CMP, CBC, TSH, amy and lipase. He would like a second opinion, refer to [facility]? Visit took place when the system was down

## 2017-05-16 DIAGNOSIS — R195 Other fecal abnormalities: Secondary | ICD-10-CM | POA: Diagnosis not present

## 2017-05-16 DIAGNOSIS — R1013 Epigastric pain: Secondary | ICD-10-CM | POA: Diagnosis not present

## 2017-05-25 ENCOUNTER — Encounter: Payer: Self-pay | Admitting: Internal Medicine

## 2017-05-26 DIAGNOSIS — R143 Flatulence: Secondary | ICD-10-CM | POA: Diagnosis not present

## 2017-05-26 DIAGNOSIS — R195 Other fecal abnormalities: Secondary | ICD-10-CM | POA: Diagnosis not present

## 2017-05-26 DIAGNOSIS — R1013 Epigastric pain: Secondary | ICD-10-CM | POA: Diagnosis not present

## 2017-05-26 DIAGNOSIS — K909 Intestinal malabsorption, unspecified: Secondary | ICD-10-CM | POA: Diagnosis not present

## 2017-09-05 ENCOUNTER — Encounter: Payer: Self-pay | Admitting: Internal Medicine

## 2017-09-05 ENCOUNTER — Ambulatory Visit (INDEPENDENT_AMBULATORY_CARE_PROVIDER_SITE_OTHER): Payer: 59 | Admitting: Internal Medicine

## 2017-09-05 VITALS — BP 116/70 | HR 55 | Temp 98.1°F | Resp 14 | Ht 70.0 in | Wt 153.5 lb

## 2017-09-05 DIAGNOSIS — Z0000 Encounter for general adult medical examination without abnormal findings: Secondary | ICD-10-CM

## 2017-09-05 LAB — LIPID PANEL
Cholesterol: 175 mg/dL (ref 0–200)
HDL: 64.4 mg/dL (ref >39.00)
LDL Cholesterol: 98 mg/dL (ref 0–99)
NonHDL: 111
Total CHOL/HDL Ratio: 3
Triglycerides: 67 mg/dL (ref 0.0–149.0)
VLDL: 13.4 mg/dL (ref 0.0–40.0)

## 2017-09-05 LAB — BASIC METABOLIC PANEL
BUN: 13 mg/dL (ref 6–23)
CALCIUM: 9.5 mg/dL (ref 8.4–10.5)
CHLORIDE: 104 meq/L (ref 96–112)
CO2: 30 meq/L (ref 19–32)
CREATININE: 1.21 mg/dL (ref 0.40–1.50)
GFR: 65.14 mL/min (ref >60.00)
GLUCOSE: 99 mg/dL (ref 70–99)
Potassium: 4.6 mEq/L (ref 3.5–5.1)
Sodium: 141 mEq/L (ref 135–145)

## 2017-09-05 LAB — PSA: PSA: 1.65 ng/mL (ref 0.10–4.00)

## 2017-09-05 LAB — HEMOGLOBIN A1C: Hgb A1c MFr Bld: 5.5 % (ref 4.6–6.5)

## 2017-09-05 NOTE — Assessment & Plan Note (Signed)
Abdominal pain: Resolved  with gluten-free diet and other dietary changes Form for work completed and faxed RTC 1 year

## 2017-09-05 NOTE — Progress Notes (Signed)
Subjective:    Patient ID: Bradley Meyer, male    DOB: [birth year], 61 y.o.   MRN: [id number]  DOS:  09/05/2017 Type of visit - description : cpx Interval history: No major concerns   Review of Systems Since the last visit, GI symptoms resolved   A 14 point review of systems is negative   Past Medical History:  Diagnosis Date  . BCC (basal cell carcinoma of skin)    nose 2006  . Colon polyp   . Kidney stones   . Palpitation     2010, saw [facility] (w/u per patient negative)    . Vitreous detachment 05-2010    Past Surgical History:  Procedure Laterality Date  . APPENDECTOMY  1971  . KNEE ARTHROSCOPY  2002   right  . POLYPECTOMY      Social History   Socioeconomic History  . Marital status: Married    Spouse name: Not on file  . Number of children: 0  . Years of education: Not on file  . Highest education level: Not on file  Social Needs  . Financial resource strain: Not on file  . Food insecurity - worry: Not on file  . Food insecurity - inability: Not on file  . Transportation needs - medical: Not on file  . Transportation needs - non-medical: Not on file  Occupational History  . Occupation: Art gallery manager , [employer]  Tobacco Use  . Smoking status: Former Smoker    Packs/day: 0.25    Years: 12.00    Pack years: 3.00    Types: Cigarettes    Last attempt to quit: 07/01/2007    Years since quitting: 10.1  . Smokeless tobacco: Never Used  Substance and Sexual Activity  . Alcohol use: Yes    Alcohol/week: 0.6 oz    Types: 1 Cans of beer per week    Comment: Occassionally  . Drug use: No  . Sexual activity: Not on file  Other Topics Concern  . Not on file  Social History Narrative   moved here from [place] 10-2008     Occupation: Art gallery manager     Married , no children           Family History  Problem Relation Age of Onset  . Prostate cancer Father 41  . CAD Other        uncle, in his 63s  . Colon polyps Mother   . Colon cancer  Neg Hx   . Stomach cancer Neg Hx   . Diabetes Neg Hx   . Kidney disease Neg Hx   . Gallbladder disease Neg Hx   . Esophageal cancer Neg Hx   . Rectal cancer Neg Hx      Allergies as of 09/05/2017   No Known Allergies     Medication List    as of 09/05/2017  7:27 PM   You have not been prescribed any medications.        Objective:   Physical Exam BP 116/70 (BP Location: Left Arm, Patient Position: Sitting, Cuff Size: Small)   Pulse (!) 55   Temp 98.1 F (36.7 C) (Oral)   Resp 14   Ht 5\' 10"  (1.778 m)   Wt 153 lb 8 oz (69.6 kg)   SpO2 97%   BMI 22.02 kg/m   General:   Well developed, well nourished . NAD.  Neck: No  thyromegaly  HEENT:  Normocephalic . Face symmetric, atraumatic Lungs:  CTA B Normal respiratory effort,  no intercostal retractions, no accessory muscle use. Heart: RRR,  no murmur.  No pretibial edema bilaterally  Abdomen:  Not distended, soft, non-tender. No rebound or rigidity.   Rectal:  External abnormalities: none. Normal sphincter tone. No rectal masses or tenderness.  No stools found Prostate: Prostate gland firm and smooth, no enlargement, nodularity, tenderness, mass, asymmetry or induration. \ Skin: Exposed areas without rash. Not pale. Not jaundice Neurologic:  alert & oriented X3.  Speech normal, gait appropriate for age and unassisted Strength symmetric and appropriate for age.  Psych: Cognition and judgment appear intact.  Cooperative with normal attention span and concentration.  Behavior appropriate. No anxious or depressed appearing.     Assessment & Plan:   Assessment H/o Anxiety H/o Palpitations 2010, saw cards, workup (-) per pt BCC nose 2016, sees derm q year Vitreous detachment 2011 Chronic abdominal pain: 2017- saw GI, EGD, colonoscopy, biopsies, CT unrevealing. Sx resolved with gluten-free diet and other dietary changes  PLAN: Abdominal pain: Resolved  with gluten-free diet and other dietary changes Form for  work completed and faxed RTC 1 year

## 2017-09-05 NOTE — Progress Notes (Signed)
Pre visit review using our clinic review tool, if applicable. No additional management support is needed unless otherwise documented below in the visit note. 

## 2017-09-05 NOTE — Patient Instructions (Signed)
GO TO THE LAB : Get the blood work     GO TO THE FRONT DESK Schedule your next appointment   for a physical exam in 1 year 

## 2017-09-05 NOTE — Assessment & Plan Note (Signed)
-  Td 2016; shingrix discussed , had a flu shot -CCS[place]:random biopsies were normal, terminal ileum was normal appearing and normal by biopsy, 67mm pedunculated TVA was removed from rectum Colonoscopy 07/2011--- next 5 years cscope 07-2016 (-), 10 years -Prostate cancer screening: DRE normal, check a PSA -Labs: FLP, A1c, PSA, bmp -Has a healthy lifestyle. Vegetarian

## 2017-09-18 ENCOUNTER — Encounter: Payer: Self-pay | Admitting: Internal Medicine

## 2018-03-16 DIAGNOSIS — J342 Deviated nasal septum: Secondary | ICD-10-CM | POA: Diagnosis not present

## 2018-03-16 DIAGNOSIS — J3489 Other specified disorders of nose and nasal sinuses: Secondary | ICD-10-CM | POA: Diagnosis not present

## 2018-03-16 DIAGNOSIS — J343 Hypertrophy of nasal turbinates: Secondary | ICD-10-CM | POA: Diagnosis not present

## 2018-04-19 ENCOUNTER — Encounter: Payer: Self-pay | Admitting: Internal Medicine

## 2018-04-19 ENCOUNTER — Ambulatory Visit (INDEPENDENT_AMBULATORY_CARE_PROVIDER_SITE_OTHER): Payer: 59 | Admitting: Internal Medicine

## 2018-04-19 VITALS — BP 126/78 | HR 68 | Temp 98.0°F | Resp 16 | Ht 70.0 in | Wt 155.4 lb

## 2018-04-19 DIAGNOSIS — R109 Unspecified abdominal pain: Secondary | ICD-10-CM

## 2018-04-19 LAB — URINALYSIS, ROUTINE W REFLEX MICROSCOPIC
Bilirubin Urine: NEGATIVE
Hgb urine dipstick: NEGATIVE
KETONES UR: NEGATIVE
LEUKOCYTES UA: NEGATIVE
NITRITE: NEGATIVE
RBC / HPF: NONE SEEN (ref 0–?)
SPECIFIC GRAVITY, URINE: 1.025 (ref 1.000–1.030)
TOTAL PROTEIN, URINE-UPE24: NEGATIVE
URINE GLUCOSE: NEGATIVE
Urobilinogen, UA: 0.2 (ref 0.0–1.0)
WBC, UA: NONE SEEN (ref 0–?)
pH: 5.5 (ref 5.0–8.0)

## 2018-04-19 LAB — POC URINALSYSI DIPSTICK (AUTOMATED)
Bilirubin, UA: NEGATIVE
Blood, UA: NEGATIVE
Glucose, UA: NEGATIVE
Ketones, UA: NEGATIVE
Nitrite, UA: NEGATIVE
PH UA: 5 (ref 5.0–8.0)
PROTEIN UA: NEGATIVE
UROBILINOGEN UA: 1 U/dL

## 2018-04-19 LAB — CBC WITH DIFFERENTIAL/PLATELET
BASOS ABS: 0 10*3/uL (ref 0.0–0.1)
BASOS PCT: 0.8 % (ref 0.0–3.0)
Eosinophils Absolute: 0.1 10*3/uL (ref 0.0–0.7)
Eosinophils Relative: 1.4 % (ref 0.0–5.0)
HEMATOCRIT: 42.9 % (ref 39.0–52.0)
Hemoglobin: 14.2 g/dL (ref 13.0–17.0)
LYMPHS ABS: 1.5 10*3/uL (ref 0.7–4.0)
LYMPHS PCT: 30.4 % (ref 12.0–46.0)
MCHC: 33.1 g/dL (ref 30.0–36.0)
MCV: 95.1 fl (ref 78.0–100.0)
MONOS PCT: 10.2 % (ref 3.0–12.0)
Monocytes Absolute: 0.5 10*3/uL (ref 0.1–1.0)
NEUTROS ABS: 2.9 10*3/uL (ref 1.4–7.7)
Neutrophils Relative %: 57.2 % (ref 43.0–77.0)
PLATELETS: 198 10*3/uL (ref 150.0–400.0)
RBC: 4.51 Mil/uL (ref 4.22–5.81)
RDW: 12.6 % (ref 11.5–15.5)
WBC: 5.1 10*3/uL (ref 4.0–10.5)

## 2018-04-19 NOTE — Progress Notes (Signed)
Subjective:    Patient ID: Bradley Meyer, male    DOB: 04-18-1956, 62 y.o.   MRN: [id number]  DOS:  04/19/2018 Type of visit - description : Acute visit Interval history: Symptoms started 6 months ago approximately, reports mild but steady pain at the left lower quadrant of the abdomen, worse when he runs or when he tense his  abdominal wall. Symptoms are only located on that area with no radiation. No bulging, mass or hernia that he can tell   Wt Readings from Last 3 Encounters:  04/19/18 155 lb 6 oz (70.5 kg)  09/05/17 153 lb 8 oz (69.6 kg)  05/10/17 156 lb 4 oz (70.9 kg)    Review of Systems  Denies back pain. He has chronic mild diarrhea that is unchanged.  No nausea, vomiting or blood in the stools No dysuria, gross hematuria difficulty urinating No scrotal pain.  Past Medical History:  Diagnosis Date  . BCC (basal cell carcinoma of skin)    nose 2006  . Colon polyp   . Kidney stones   . Palpitation     2010, saw [facility]iology (w/u per patient negative)    . Vitreous detachment 05-2010    Past Surgical History:  Procedure Laterality Date  . APPENDECTOMY  1971  . KNEE ARTHROSCOPY  2002   right  . POLYPECTOMY      Social History   Socioeconomic History  . Marital status: Married    Spouse name: Not on file  . Number of children: 0  . Years of education: Not on file  . Highest education level: Not on file  Occupational History  . Occupation: Art gallery manager , Garment/textile technologist  Social Needs  . Financial resource strain: Not on file  . Food insecurity:    Worry: Not on file    Inability: Not on file  . Transportation needs:    Medical: Not on file    Non-medical: Not on file  Tobacco Use  . Smoking status: Former Smoker    Packs/day: 0.25    Years: 12.00    Pack years: 3.00    Types: Cigarettes    Last attempt to quit: 07/01/2007    Years since quitting: 10.8  . Smokeless tobacco: Never Used  Substance and Sexual Activity  . Alcohol use: Yes   Alcohol/week: 0.6 oz    Types: 1 Cans of beer per week    Comment: Occassionally  . Drug use: No  . Sexual activity: Not on file  Lifestyle  . Physical activity:    Days per week: Not on file    Minutes per session: Not on file  . Stress: Not on file  Relationships  . Social connections:    Talks on phone: Not on file    Gets together: Not on file    Attends religious service: Not on file    Active member of club or organization: Not on file    Attends meetings of clubs or organizations: Not on file    Relationship status: Not on file  . Intimate partner violence:    Fear of current or ex partner: Not on file    Emotionally abused: Not on file    Physically abused: Not on file    Forced sexual activity: Not on file  Other Topics Concern  . Not on file  Social History Narrative   moved here from [place]     Occupation: Art gallery manager     Married , no children  Allergies as of 04/19/2018   No Known Allergies     Medication List    as of 04/19/2018  8:35 AM   You have not been prescribed any medications.        Objective:   Physical Exam  Abdominal:     BP 126/78 (BP Location: Left Arm, Patient Position: Sitting, Cuff Size: Small)   Pulse 68   Temp 98 F (36.7 C) (Oral)   Resp 16   Ht 5\' 10"  (1.778 m)   Wt 155 lb 6 oz (70.5 kg)   SpO2 97%   BMI 22.29 kg/m  General:   Well developed, NAD, see BMI.  HEENT:  Normocephalic . Face symmetric, atraumatic Lungs:  CTA B Normal respiratory effort, no intercostal retractions, no accessory muscle use. Heart: RRR,  no murmur.  no pretibial edema bilaterally  Abdomen:  Not distended, soft, non-tender. No rebound or rigidity. Abdominal wall: No hernias, lumps, tenderness. Groin: No lymphadenopathies or hernias GU: Normal scrotal contents, normal penis MSK: Rotation of both hips normal without any pain. Skin: Not pale. Not jaundice Neurologic:  alert & oriented X3.  Speech normal, gait  appropriate for age and unassisted Psych--  Cognition and judgment appear intact.  Cooperative with normal attention span and concentration.  Behavior appropriate. No anxious or depressed appearing.     Assessment & Plan:   Assessment H/o Anxiety H/o Palpitations 2010, saw cards, workup (-) per pt BCC nose 2016, sees derm q year Vitreous detachment 2011 Chronic abdominal pain: 2017- saw GI, EGD, colonoscopy, biopsies, CT unrevealing. Sx resolved with gluten-free diet and other dietary changes  PLAN: Abdominal pain: Suspect this is abdominal wall pain, no obvious hernia on exam. This is going on for 6 months. U dip: Trace leuks. Plan: Check a CBC, UA, urine culture.  Refer to general surgery for a second opinion.   Agree  this is an abdominal wall pain?Marland Kitchen

## 2018-04-19 NOTE — Progress Notes (Signed)
Pre visit review using our clinic review tool, if applicable. No additional management support is needed unless otherwise documented below in the visit note. 

## 2018-04-19 NOTE — Patient Instructions (Signed)
GO TO THE LAB : Get the blood work    We are referring you to a surgeon for a second opinion  Call if your symptoms increase or change

## 2018-04-19 NOTE — Assessment & Plan Note (Signed)
Abdominal pain: Suspect this is abdominal wall pain, no obvious hernia on exam. This is going on for 6 months. U dip: Trace leuks. Plan: Check a CBC, UA, urine culture.  Refer to general surgery for a second opinion.   Agree  this is an abdominal wall pain?Marland Kitchen

## 2018-04-20 LAB — URINE CULTURE
MICRO NUMBER:: 90852203
RESULT: NO GROWTH
SPECIMEN QUALITY:: ADEQUATE

## 2018-04-23 ENCOUNTER — Telehealth: Payer: Self-pay | Admitting: Internal Medicine

## 2018-04-23 NOTE — Telephone Encounter (Signed)
Author phoned [facility] to provide more information for pending referral. Author left generic VM with call back [phone], and [phone].

## 2018-04-23 NOTE — Telephone Encounter (Signed)
Copied from [place] [id number]. Topic: Referral - Question >> Apr 23, 2018  3:39 PM Jarold Motto, Fraser Din wrote: Reason for CRM: sarah from [place] [facility] called and stated that she would need more specifics on why the pt was being referred to them please advise. Cb#[id number]

## 2018-04-24 DIAGNOSIS — J342 Deviated nasal septum: Secondary | ICD-10-CM | POA: Diagnosis not present

## 2018-04-24 DIAGNOSIS — J343 Hypertrophy of nasal turbinates: Secondary | ICD-10-CM | POA: Diagnosis not present

## 2018-04-24 DIAGNOSIS — J3489 Other specified disorders of nose and nasal sinuses: Secondary | ICD-10-CM | POA: Diagnosis not present

## 2018-05-02 DIAGNOSIS — J343 Hypertrophy of nasal turbinates: Secondary | ICD-10-CM | POA: Diagnosis not present

## 2018-05-02 DIAGNOSIS — J342 Deviated nasal septum: Secondary | ICD-10-CM | POA: Diagnosis not present

## 2018-05-02 DIAGNOSIS — J3489 Other specified disorders of nose and nasal sinuses: Secondary | ICD-10-CM | POA: Diagnosis not present

## 2018-05-03 HISTORY — PX: NASAL SEPTUM SURGERY: SHX37

## 2018-05-14 DIAGNOSIS — Z08 Encounter for follow-up examination after completed treatment for malignant neoplasm: Secondary | ICD-10-CM | POA: Diagnosis not present

## 2018-05-14 DIAGNOSIS — L821 Other seborrheic keratosis: Secondary | ICD-10-CM | POA: Diagnosis not present

## 2018-05-14 DIAGNOSIS — L218 Other seborrheic dermatitis: Secondary | ICD-10-CM | POA: Diagnosis not present

## 2018-07-02 DIAGNOSIS — M7662 Achilles tendinitis, left leg: Secondary | ICD-10-CM | POA: Diagnosis not present

## 2018-07-02 DIAGNOSIS — M6702 Short Achilles tendon (acquired), left ankle: Secondary | ICD-10-CM | POA: Diagnosis not present

## 2018-07-23 DIAGNOSIS — M6702 Short Achilles tendon (acquired), left ankle: Secondary | ICD-10-CM | POA: Diagnosis not present

## 2018-07-23 DIAGNOSIS — L600 Ingrowing nail: Secondary | ICD-10-CM | POA: Diagnosis not present

## 2018-07-23 DIAGNOSIS — M7662 Achilles tendinitis, left leg: Secondary | ICD-10-CM | POA: Diagnosis not present

## 2018-09-10 ENCOUNTER — Ambulatory Visit (INDEPENDENT_AMBULATORY_CARE_PROVIDER_SITE_OTHER): Payer: 59 | Admitting: Internal Medicine

## 2018-09-10 ENCOUNTER — Encounter: Payer: Self-pay | Admitting: Internal Medicine

## 2018-09-10 VITALS — BP 128/84 | HR 75 | Temp 97.9°F | Resp 16 | Ht 70.0 in | Wt 161.5 lb

## 2018-09-10 DIAGNOSIS — R109 Unspecified abdominal pain: Secondary | ICD-10-CM | POA: Diagnosis not present

## 2018-09-10 DIAGNOSIS — Z0000 Encounter for general adult medical examination without abnormal findings: Secondary | ICD-10-CM | POA: Diagnosis not present

## 2018-09-10 NOTE — Progress Notes (Signed)
Subjective:    Patient ID: Bradley Meyer, male    DOB: 08/16/1956, 62 y.o.   MRN: [id number]  DOS:  09/10/2018 Type of visit - description : cpx In general feeling well. See last office visit, continue to have left-sided abdominal wall pain with running.   Review of Systems  Other than above, a 14 point review of systems is negative    Past Medical History:  Diagnosis Date  . BCC (basal cell carcinoma of skin)    nose 2006  . Colon polyp   . Kidney stones   . Palpitation     2010, saw [facility] (w/u per patient negative)    . Vitreous detachment 05-2010    Past Surgical History:  Procedure Laterality Date  . APPENDECTOMY  1971  . KNEE ARTHROSCOPY  2002   right  . NASAL SEPTUM SURGERY  05/2018  . POLYPECTOMY      Social History   Socioeconomic History  . Marital status: Married    Spouse name: Not on file  . Number of children: 0  . Years of education: Not on file  . Highest education level: Not on file  Occupational History  . Occupation: Art gallery manager , Garment/textile technologist  Social Needs  . Financial resource strain: Not on file  . Food insecurity:    Worry: Not on file    Inability: Not on file  . Transportation needs:    Medical: Not on file    Non-medical: Not on file  Tobacco Use  . Smoking status: Former Smoker    Packs/day: 0.10    Years: 12.00    Pack years: 1.20    Types: Cigarettes    Last attempt to quit: 07/01/2007    Years since quitting: 11.2  . Smokeless tobacco: Never Used  Substance and Sexual Activity  . Alcohol use: Yes    Alcohol/week: 1.0 standard drinks    Types: 1 Cans of beer per week    Comment: Occassionally  . Drug use: No  . Sexual activity: Not on file  Lifestyle  . Physical activity:    Days per week: Not on file    Minutes per session: Not on file  . Stress: Not on file  Relationships  . Social connections:    Talks on phone: Not on file    Gets together: Not on file    Attends religious service: Not on file     Active member of club or organization: Not on file    Attends meetings of clubs or organizations: Not on file    Relationship status: Not on file  . Intimate partner violence:    Fear of current or ex partner: Not on file    Emotionally abused: Not on file    Physically abused: Not on file    Forced sexual activity: Not on file  Other Topics Concern  . Not on file  Social History Narrative   moved here from [place] 10-2008     Occupation: Art gallery manager     Married , no children           Family History  Problem Relation Age of Onset  . Prostate cancer Father 27  . CAD Other        uncle, in his 14s  . Colon polyps Mother   . Colon cancer Neg Hx   . Stomach cancer Neg Hx   . Diabetes Neg Hx   . Kidney disease Neg Hx   . Gallbladder  disease Neg Hx   . Esophageal cancer Neg Hx   . Rectal cancer Neg Hx     Allergies as of 09/10/2018   No Known Allergies     Medication List    as of 09/10/2018 11:59 PM   You have not been prescribed any medications.         Objective:   Physical Exam BP 128/84 (BP Location: Left Arm, Patient Position: Sitting, Cuff Size: Normal)   Pulse 75   Temp 97.9 F (36.6 C) (Oral)   Resp 16   Ht 5\' 10"  (1.778 m)   Wt 161 lb 8 oz (73.3 kg)   SpO2 98%   BMI 23.17 kg/m  General: Well developed, NAD, BMI noted Neck: No  thyromegaly  HEENT:  Normocephalic . Face symmetric, atraumatic Lungs:  CTA B Normal respiratory effort, no intercostal retractions, no accessory muscle use. Heart: RRR,  no murmur.  No pretibial edema bilaterally  Abdomen:  Not distended, soft, non-tender. No rebound or rigidity.   Skin: Exposed areas without rash. Not pale. Not jaundice Neurologic:  alert & oriented X3.  Speech normal, gait appropriate for age and unassisted Strength symmetric and appropriate for age.  Psych: Cognition and judgment appear intact.  Cooperative with normal attention span and concentration.  Behavior appropriate. No anxious  or depressed appearing.     Assessment & Plan:    Assessment H/o Anxiety H/o Palpitations 2010, saw cards, workup (-) per pt BCC nose 2016, sees derm q year Vitreous detachment 2011 Chronic abdominal pain: 2017- saw GI, EGD, colonoscopy, biopsies, CT unrevealing. Sx resolved with gluten-free diet and other dietary changes  PLAN: Here for CPX Abdominal pain: Likely an abdominal wall pain, no mass or hernia.  Refer to Ortho, further eval?  Local injection?Marland Kitchen RTC 1 year

## 2018-09-10 NOTE — Patient Instructions (Addendum)
   GO TO THE FRONT DESK Schedule your next appointment for a  Physical in 1 year  Schedule labs to be done this week , fasting    Think about [facility]

## 2018-09-10 NOTE — Progress Notes (Signed)
Pre visit review using our clinic review tool, if applicable. No additional management support is needed unless otherwise documented below in the visit note. 

## 2018-09-10 NOTE — Assessment & Plan Note (Signed)
-  Td 2016; shingrix discussed , had a flu shot -CCS[place]:random biopsies were normal, terminal ileum was normal appearing and normal by biopsy, 31mm pedunculated TVA was removed from rectum Colonoscopy 07/2011--- next 5 years cscope 07-2016 (-), 10 years -Prostate cancer screening: DRE - PSA 2018 wnl -Labs: cmp flp a1c  -Has a healthy lifestyle. Vegetarian

## 2018-09-11 ENCOUNTER — Other Ambulatory Visit (INDEPENDENT_AMBULATORY_CARE_PROVIDER_SITE_OTHER): Payer: 59

## 2018-09-11 ENCOUNTER — Telehealth: Payer: Self-pay

## 2018-09-11 DIAGNOSIS — Z0000 Encounter for general adult medical examination without abnormal findings: Secondary | ICD-10-CM

## 2018-09-11 LAB — LIPID PANEL
CHOLESTEROL: 156 mg/dL (ref 0–200)
HDL: 62.4 mg/dL (ref >39.00)
LDL Cholesterol: 81 mg/dL (ref 0–99)
NonHDL: 93.41
TRIGLYCERIDES: 63 mg/dL (ref 0.0–149.0)
Total CHOL/HDL Ratio: 2
VLDL: 12.6 mg/dL (ref 0.0–40.0)

## 2018-09-11 LAB — COMPREHENSIVE METABOLIC PANEL
ALBUMIN: 4 g/dL (ref 3.5–5.2)
ALK PHOS: 87 U/L (ref 39–117)
ALT: 13 U/L (ref 0–53)
AST: 18 U/L (ref 0–37)
BUN: 16 mg/dL (ref 6–23)
CO2: 30 mEq/L (ref 19–32)
CREATININE: 1.21 mg/dL (ref 0.40–1.50)
Calcium: 9.1 mg/dL (ref 8.4–10.5)
Chloride: 104 mEq/L (ref 96–112)
GFR: 64.92 mL/min (ref >60.00)
Glucose, Bld: 84 mg/dL (ref 70–99)
Potassium: 4.9 mEq/L (ref 3.5–5.1)
SODIUM: 139 meq/L (ref 135–145)
TOTAL PROTEIN: 6.2 g/dL (ref 6.0–8.3)
Total Bilirubin: 0.9 mg/dL (ref 0.2–1.2)

## 2018-09-11 LAB — HEMOGLOBIN A1C: HEMOGLOBIN A1C: 5.5 % (ref 4.6–6.5)

## 2018-09-11 NOTE — Assessment & Plan Note (Signed)
Here for CPX Abdominal pain: Likely an abdominal wall pain, no mass or hernia.  Refer to Ortho, further eval?  Local injection?Marland Kitchen RTC 1 year

## 2018-09-11 NOTE — Telephone Encounter (Signed)
Physical form completed and faxed to [facility] at [phone]. Form sent for scanning.  

## 2018-09-19 DIAGNOSIS — M25552 Pain in left hip: Secondary | ICD-10-CM | POA: Diagnosis not present

## 2019-07-23 ENCOUNTER — Other Ambulatory Visit: Payer: Self-pay

## 2019-07-23 ENCOUNTER — Encounter: Payer: Self-pay | Admitting: Internal Medicine

## 2019-07-23 ENCOUNTER — Ambulatory Visit (INDEPENDENT_AMBULATORY_CARE_PROVIDER_SITE_OTHER): Payer: 59 | Admitting: Internal Medicine

## 2019-07-23 VITALS — BP 139/74 | HR 88 | Temp 97.3°F | Resp 16 | Ht 70.0 in | Wt 162.1 lb

## 2019-07-23 DIAGNOSIS — Z23 Encounter for immunization: Secondary | ICD-10-CM

## 2019-07-23 DIAGNOSIS — Z0000 Encounter for general adult medical examination without abnormal findings: Secondary | ICD-10-CM

## 2019-07-23 DIAGNOSIS — Z125 Encounter for screening for malignant neoplasm of prostate: Secondary | ICD-10-CM

## 2019-07-23 LAB — CBC WITH DIFFERENTIAL/PLATELET
Basophils Absolute: 0.1 10*3/uL (ref 0.0–0.1)
Basophils Relative: 1.1 % (ref 0.0–3.0)
Eosinophils Absolute: 0.1 10*3/uL (ref 0.0–0.7)
Eosinophils Relative: 0.9 % (ref 0.0–5.0)
HCT: 43.9 % (ref 39.0–52.0)
Hemoglobin: 14.4 g/dL (ref 13.0–17.0)
Lymphocytes Relative: 30.1 % (ref 12.0–46.0)
Lymphs Abs: 1.8 10*3/uL (ref 0.7–4.0)
MCHC: 32.8 g/dL (ref 30.0–36.0)
MCV: 94.2 fl (ref 78.0–100.0)
Monocytes Absolute: 0.5 10*3/uL (ref 0.1–1.0)
Monocytes Relative: 8.1 % (ref 3.0–12.0)
Neutro Abs: 3.6 10*3/uL (ref 1.4–7.7)
Neutrophils Relative %: 59.8 % (ref 43.0–77.0)
Platelets: 197 10*3/uL (ref 150.0–400.0)
RBC: 4.66 Mil/uL (ref 4.22–5.81)
RDW: 12.8 % (ref 11.5–15.5)
WBC: 6.1 10*3/uL (ref 4.0–10.5)

## 2019-07-23 LAB — COMPREHENSIVE METABOLIC PANEL
ALT: 16 U/L (ref 0–53)
AST: 18 U/L (ref 0–37)
Albumin: 4.4 g/dL (ref 3.5–5.2)
Alkaline Phosphatase: 100 U/L (ref 39–117)
BUN: 13 mg/dL (ref 6–23)
CO2: 30 mEq/L (ref 19–32)
Calcium: 9.4 mg/dL (ref 8.4–10.5)
Chloride: 103 mEq/L (ref 96–112)
Creatinine, Ser: 1.16 mg/dL (ref 0.40–1.50)
GFR: 63.95 mL/min (ref >60.00)
Glucose, Bld: 88 mg/dL (ref 70–99)
Potassium: 4.7 mEq/L (ref 3.5–5.1)
Sodium: 139 mEq/L (ref 135–145)
Total Bilirubin: 1.1 mg/dL (ref 0.2–1.2)
Total Protein: 6.5 g/dL (ref 6.0–8.3)

## 2019-07-23 LAB — PSA: PSA: 1.77 ng/mL (ref 0.10–4.00)

## 2019-07-23 LAB — LIPID PANEL
Cholesterol: 164 mg/dL (ref 0–200)
HDL: 61 mg/dL (ref >39.00)
LDL Cholesterol: 91 mg/dL (ref 0–99)
NonHDL: 103.19
Total CHOL/HDL Ratio: 3
Triglycerides: 59 mg/dL (ref 0.0–149.0)
VLDL: 11.8 mg/dL (ref 0.0–40.0)

## 2019-07-23 LAB — VITAMIN D 25 HYDROXY (VIT D DEFICIENCY, FRACTURES): VITD: 53.03 ng/mL (ref 30.00–100.00)

## 2019-07-23 LAB — HEMOGLOBIN A1C: Hgb A1c MFr Bld: 5.7 % (ref 4.6–6.5)

## 2019-07-23 NOTE — Progress Notes (Signed)
Subjective:    Patient ID: Bradley Meyer, male    DOB: 20-May-1956, 63 y.o.   MRN: [id number]  DOS:  07/23/2019 Type of visit - description: Here for CPX  Doing well physically and emotionally. He remains active From time to time when he swallows solid foods he feels like they get "stuck" by the tonsils area.  No cough, shortness of breath.  Review of Systems   Other than above, a 14 point review of systems is negative      Past Medical History:  Diagnosis Date  . BCC (basal cell carcinoma of skin)    nose 2006  . Colon polyp   . Kidney stones   . Palpitation     2010, saw [facility]  Cardiology (w/u per patient negative)    . Vitreous detachment 05-2010    Past Surgical History:  Procedure Laterality Date  . APPENDECTOMY  1971  . KNEE ARTHROSCOPY  2002   right  . NASAL SEPTUM SURGERY  05/2018  . POLYPECTOMY      Social History   Socioeconomic History  . Marital status: Married    Spouse name: Not on file  . Number of children: 0  . Years of education: Not on file  . Highest education level: Not on file  Occupational History  . Occupation: Art gallery manager , Garment/textile technologist  . Occupation: works from home   Social Needs  . Financial resource strain: Not on file  . Food insecurity    Worry: Not on file    Inability: Not on file  . Transportation needs    Medical: Not on file    Non-medical: Not on file  Tobacco Use  . Smoking status: Former Smoker    Packs/day: 0.10    Years: 12.00    Pack years: 1.20    Types: Cigarettes    Quit date: 07/01/2007    Years since quitting: 12.0  . Smokeless tobacco: Never Used  Substance and Sexual Activity  . Alcohol use: Yes    Alcohol/week: 1.0 standard drinks    Types: 1 Cans of beer per week    Comment: Occassionally  . Drug use: No  . Sexual activity: Not on file  Lifestyle  . Physical activity    Days per week: Not on file    Minutes per session: Not on file  . Stress: Not on file  Relationships  . Social  Herbalist on phone: Not on file    Gets together: Not on file    Attends religious service: Not on file    Active member of club or organization: Not on file    Attends meetings of clubs or organizations: Not on file    Relationship status: Not on file  . Intimate partner violence    Fear of current or ex partner: Not on file    Emotionally abused: Not on file    Physically abused: Not on file    Forced sexual activity: Not on file  Other Topics Concern  . Not on file  Social History Narrative   moved here from [place] 10-2008     Occupation: Art gallery manager     Married , no children           Family History  Problem Relation Age of Onset  . Prostate cancer Father 80  . Bladder Cancer Father   . CAD Other        uncle, in his 40s  . Colon polyps Mother   .  Colon cancer Neg Hx   . Stomach cancer Neg Hx   . Diabetes Neg Hx   . Kidney disease Neg Hx   . Gallbladder disease Neg Hx   . Esophageal cancer Neg Hx   . Rectal cancer Neg Hx      Allergies as of 07/23/2019   No Known Allergies     Medication List    as of July 23, 2019 11:59 PM   You have not been prescribed any medications.         Objective:   Physical Exam BP 139/74 (BP Location: Left Arm, Patient Position: Sitting, Cuff Size: Small)   Pulse 88   Temp (!) 97.3 F (36.3 C) (Temporal)   Resp 16   Ht 5\' 10"  (1.778 m)   Wt 162 lb 2 oz (73.5 kg)   SpO2 100%   BMI 23.26 kg/m  General: Well developed, NAD, BMI noted Neck: No  thyromegaly.  No mass at the neck or supraclavicular areas HEENT:  Normocephalic . Face symmetric, atraumatic. Throat: Symmetric Lungs:  CTA B Normal respiratory effort, no intercostal retractions, no accessory muscle use. Heart: RRR,  no murmur.  No pretibial edema bilaterally  Abdomen:  Not distended, soft, non-tender. No rebound or rigidity.   DRE: Normal prostate Skin: Exposed areas without rash. Not pale. Not jaundice Neurologic:  alert & oriented  X3.  Speech normal, gait appropriate for age and unassisted Strength symmetric and appropriate for age.  Psych: Cognition and judgment appear intact.  Cooperative with normal attention span and concentration.  Behavior appropriate. No anxious or depressed appearing.     Assessment     Assessment H/o Anxiety H/o Palpitations 2010, saw cards, workup (-) per pt BCC nose 2016, sees derm q year Vitreous detachment 2011 Chronic abdominal pain: 2017- saw GI, EGD, colonoscopy, biopsies, CT unrevealing. Sx resolved with gluten-free diet and other dietary changes  PLAN:  Here for CPX Dysphagia: Occasionally feels like the food is stuck in his throat, no cough, symptoms are sporadic.  Physical exam normal, recommend observation, if symptoms increase he will let me know.  GI referral? Social: Lives with wife, since the quarantine started he is working from home and really enjoying that. RTC 1 year

## 2019-07-23 NOTE — Progress Notes (Signed)
Pre visit review using our clinic review tool, if applicable. No additional management support is needed unless otherwise documented below in the visit note. 

## 2019-07-23 NOTE — Assessment & Plan Note (Addendum)
-  Td 2016 -  shingrix #1 today, booster in  2-3 months   - had a flu shot last week @ the pharmacy -CCS: [place]:random biopsies were normal, terminal ileum was normal appearing and normal by biopsy, 26mm pedunculated TVA was removed from rectum Colonoscopy 07/2011--- next 5 years cscope 07-2016 (-), 10 years -Prostate cancer screening: DRE normal, check a PSA   -Labs: CMP, FLP, CBC, A1c, PSA.  Will also check a vitamin D at patient request -Has a healthy lifestyle.

## 2019-07-23 NOTE — Patient Instructions (Addendum)
GO TO THE LAB : Get the blood work     [place] Schedule a nurse visit to get your Shingrix No. 2 into 3 months  Schedule your next appointment   for a physical exam in 1 year

## 2019-07-24 ENCOUNTER — Telehealth: Payer: Self-pay

## 2019-07-24 NOTE — Assessment & Plan Note (Signed)
Here for CPX Dysphagia: Occasionally feels like the food is stuck in his throat, no cough, symptoms are sporadic.  Physical exam normal, recommend observation, if symptoms increase he will let me know.  GI referral? Social: Lives with wife, since the quarantine started he is working from home and really enjoying that. RTC 1 year

## 2019-07-24 NOTE — Telephone Encounter (Signed)
Physical form completed and faxed to [facility] at [phone]. Form sent for scanning. Received fax confirmation.  

## 2019-09-16 ENCOUNTER — Encounter: Payer: 59 | Admitting: Internal Medicine

## 2019-09-24 ENCOUNTER — Other Ambulatory Visit: Payer: Self-pay

## 2019-09-24 ENCOUNTER — Ambulatory Visit (INDEPENDENT_AMBULATORY_CARE_PROVIDER_SITE_OTHER): Payer: 59

## 2019-09-24 DIAGNOSIS — Z23 Encounter for immunization: Secondary | ICD-10-CM

## 2019-09-24 NOTE — Progress Notes (Signed)
Patient here today for shingrix vaccine. 0.34mL shingrix given in left deltoid IM patient tolerated well. VIS given. Patient tolerated well.

## 2020-07-23 ENCOUNTER — Ambulatory Visit (HOSPITAL_BASED_OUTPATIENT_CLINIC_OR_DEPARTMENT_OTHER)
Admission: RE | Admit: 2020-07-23 | Discharge: 2020-07-23 | Disposition: A | Discharge status: Home / Self Care | Payer: 59 | Source: Ambulatory Visit | Attending: Internal Medicine | Admitting: Internal Medicine

## 2020-07-23 ENCOUNTER — Ambulatory Visit (INDEPENDENT_AMBULATORY_CARE_PROVIDER_SITE_OTHER): Payer: 59 | Admitting: Internal Medicine

## 2020-07-23 ENCOUNTER — Encounter: Payer: Self-pay | Admitting: Internal Medicine

## 2020-07-23 ENCOUNTER — Other Ambulatory Visit: Payer: Self-pay

## 2020-07-23 VITALS — BP 127/76 | HR 59 | Temp 98.3°F | Resp 18 | Ht 70.5 in | Wt 154.4 lb

## 2020-07-23 DIAGNOSIS — R1310 Dysphagia, unspecified: Secondary | ICD-10-CM | POA: Diagnosis not present

## 2020-07-23 DIAGNOSIS — R634 Abnormal weight loss: Secondary | ICD-10-CM | POA: Insufficient documentation

## 2020-07-23 DIAGNOSIS — Z23 Encounter for immunization: Secondary | ICD-10-CM

## 2020-07-23 DIAGNOSIS — Z0000 Encounter for general adult medical examination without abnormal findings: Secondary | ICD-10-CM

## 2020-07-23 DIAGNOSIS — R109 Unspecified abdominal pain: Secondary | ICD-10-CM

## 2020-07-23 NOTE — Patient Instructions (Signed)
We are referring you to gastroenterology  GO TO THE LAB : Get the blood work     [place], [place] back for   a checkup in 4 months  STOP BY THE FIRST FLOOR:  get the XR

## 2020-07-23 NOTE — Progress Notes (Signed)
Subjective:    Patient ID: Bradley Meyer, male    DOB: 1956-04-11, 64 y.o.   MRN: [id number]  DOS:  07/23/2020 Type of visit - description: cpx  In general he feels very well but is concerned about weight loss, he has not changed his diet, he is active as usual. Denies fever chills occasional night sweats.  Also, has a pain located at the right side of the abdomen, for 2 months, on and off, no radiation, sometimes triggered by sitting down for long time. No RUQ abdominal pain  History of dysphagia, see last visit a year ago, it continues, perhaps more consistent now than last year, only to solids. He has chronic loose stools but denies nausea or vomiting.  Wt Readings from Last 3 Encounters:  07/23/20 154 lb 6.4 oz (70 kg)  07/23/19 162 lb 2 oz (73.5 kg)  09/10/18 161 lb 8 oz (73.3 kg)     Review of Systems  Other than above, a 14 point review of systems is negative     Past Medical History:  Diagnosis Date   BCC (basal cell carcinoma of skin)    nose 2006   Colon polyp    Kidney stones    Palpitation     2010, saw [facility] (w/u per patient negative)     Vitreous detachment 05-2010    Past Surgical History:  Procedure Laterality Date   APPENDECTOMY  1971   KNEE ARTHROSCOPY  2002   right   NASAL SEPTUM SURGERY  05/2018   POLYPECTOMY      Allergies as of 07/23/2020   No Known Allergies     Medication List    as of July 23, 2020  9:06 AM   You have not been prescribed any medications.        Objective:   Physical Exam Abdominal:      BP 127/76 (BP Location: Left Arm, Patient Position: Sitting)    Pulse (!) 59    Temp 98.3 F (36.8 C) (Oral)    Resp 18    Ht 5' 10.5" (1.791 m)    Wt 154 lb 6.4 oz (70 kg)    SpO2 100%    BMI 21.84 kg/m  General: Well developed, NAD, BMI noted Neck: No  thyromegaly  HEENT:  Normocephalic . Face symmetric, atraumatic Lymphatic system: No LAD is at the neck, supraclavicular-axillary-groin  areas. Lungs:  CTA B Normal respiratory effort, no intercostal retractions, no accessory muscle use. Heart: RRR,  no murmur.  Abdomen:  Not distended, soft, non-tender. No rebound or rigidity.   Lower extremities: no pretibial edema bilaterally  Skin: Exposed areas without rash. Not pale. Not jaundice Neurologic:  alert & oriented X3.  Speech normal, gait appropriate for age and unassisted Strength symmetric and appropriate for age.  Psych: Cognition and judgment appear intact.  Cooperative with normal attention span and concentration.  Behavior appropriate. No anxious or depressed appearing.     Assessment     Assessment H/o Anxiety H/o Palpitations 2010, saw cards, workup (-) per pt BCC nose 2016, sees derm q year Vitreous detachment 2011 Chronic abdominal pain: 2017- saw GI, EGD, colonoscopy, biopsies, CT unrevealing. Sx resolved with gluten-free diet and other dietary changes  PLAN: Here for CPX Weight loss: Unclear etiology, he has a healthy lifestyle, he feels well, his father was very slim. Plan: General labs including TSH and CBC with blood smear.  Otherwise RTC in 4 months Dysphagia: See last visit, symptoms are now  more consistent, he also has lost some weight.  We will get a chest x-ray and refer to GI, EGD?. Pain, left side of the abdomen, MSK?  In addition to general labs, will check a UA to check for hematuria. RTC 4 months  In addition to CPX, we assessed 2 new problems (weight loss, right-sided abdominal pain) and a worsening symptom (dysphagia)  This visit occurred during the SARS-CoV-2 public health emergency.  Safety protocols were in place, including screening questions prior to the visit, additional usage of staff PPE, and extensive cleaning of exam room while observing appropriate contact time as indicated for disinfecting solutions.

## 2020-07-24 ENCOUNTER — Encounter: Payer: Self-pay | Admitting: Internal Medicine

## 2020-07-24 ENCOUNTER — Telehealth: Payer: Self-pay

## 2020-07-24 LAB — URINALYSIS, ROUTINE W REFLEX MICROSCOPIC
Bilirubin Urine: NEGATIVE
Glucose, UA: NEGATIVE
Hgb urine dipstick: NEGATIVE
Ketones, ur: NEGATIVE
Leukocytes,Ua: NEGATIVE
Nitrite: NEGATIVE
Protein, ur: NEGATIVE
Specific Gravity, Urine: 1.019 (ref 1.001–1.03)
pH: 5.5 (ref 5.0–8.0)

## 2020-07-24 LAB — COMPREHENSIVE METABOLIC PANEL
AG Ratio: 2 (calc) (ref 1.0–2.5)
ALT: 16 U/L (ref 9–46)
AST: 18 U/L (ref 10–35)
Albumin: 4.5 g/dL (ref 3.6–5.1)
Alkaline phosphatase (APISO): 92 U/L (ref 35–144)
BUN/Creatinine Ratio: 12 (calc) (ref 6–22)
BUN: 15 mg/dL (ref 7–25)
CO2: 30 mmol/L (ref 20–32)
Calcium: 9.7 mg/dL (ref 8.6–10.3)
Chloride: 104 mmol/L (ref 98–110)
Creat: 1.28 mg/dL — ABNORMAL HIGH (ref 0.70–1.25)
Globulin: 2.2 g/dL (calc) (ref 1.9–3.7)
Glucose, Bld: 93 mg/dL (ref 65–99)
Potassium: 4.8 mmol/L (ref 3.5–5.3)
Sodium: 142 mmol/L (ref 135–146)
Total Bilirubin: 0.8 mg/dL (ref 0.2–1.2)
Total Protein: 6.7 g/dL (ref 6.1–8.1)

## 2020-07-24 LAB — LIPID PANEL
Cholesterol: 183 mg/dL (ref <200)
HDL: 67 mg/dL (ref >=40)
LDL Cholesterol (Calc): 100 mg/dL (calc) — ABNORMAL HIGH
Non-HDL Cholesterol (Calc): 116 mg/dL (calc) (ref <130)
Total CHOL/HDL Ratio: 2.7 (calc) (ref <5.0)
Triglycerides: 72 mg/dL (ref <150)

## 2020-07-24 LAB — CBC (INCLUDES DIFF/PLT) WITH PATHOLOGIST REVIEW
Absolute Monocytes: 532 cells/uL (ref 200–950)
Basophils Absolute: 39 cells/uL (ref 0–200)
Basophils Relative: 0.7 %
Eosinophils Absolute: 73 cells/uL (ref 15–500)
Eosinophils Relative: 1.3 %
HCT: 43.7 % (ref 38.5–50.0)
Hemoglobin: 14.8 g/dL (ref 13.2–17.1)
Lymphs Abs: 1518 cells/uL (ref 850–3900)
MCH: 31.6 pg (ref 27.0–33.0)
MCHC: 33.9 g/dL (ref 32.0–36.0)
MCV: 93.2 fL (ref 80.0–100.0)
MPV: 12 fL (ref 7.5–12.5)
Monocytes Relative: 9.5 %
Neutro Abs: 3438 cells/uL (ref 1500–7800)
Neutrophils Relative %: 61.4 %
Platelets: 215 10*3/uL (ref 140–400)
RBC: 4.69 10*6/uL (ref 4.20–5.80)
RDW: 11.8 % (ref 11.0–15.0)
Total Lymphocyte: 27.1 %
WBC: 5.6 10*3/uL (ref 3.8–10.8)

## 2020-07-24 LAB — TSH: TSH: 1.01 mIU/L (ref 0.40–4.50)

## 2020-07-24 LAB — HEMOGLOBIN A1C
Hgb A1c MFr Bld: 5.2 % of total Hgb (ref <5.7)
Mean Plasma Glucose: 103 (calc)
eAG (mmol/L): 5.7 (calc)

## 2020-07-24 NOTE — Assessment & Plan Note (Signed)
Here for CPX Weight loss: Unclear etiology, he has a healthy lifestyle, he feels well, his father was very slim. Plan: General labs including TSH and CBC with blood smear.  Otherwise RTC in 4 months Dysphagia: See last visit, symptoms are now more consistent, he also has lost some weight.  We will get a chest x-ray and refer to GI, EGD?. Pain, left side of the abdomen, MSK?  In addition to general labs, will check a UA to check for hematuria. RTC 4 months

## 2020-07-24 NOTE — Telephone Encounter (Signed)
Physical form completed and faxed to [facility] at [phone]. Form sent for scanning. Received fax confirmation.

## 2020-07-24 NOTE — Assessment & Plan Note (Signed)
-  Td 2016 - s/p  shingrix - s/p covid vax x 2 , pfizer , booster optional but recommended  -flu shot : today -CCS: 05/[place]:random biopsies were normal, terminal ileum was normal appearing and normal by biopsy, 50mm pedunculated TVA was removed from rectum Colonoscopy 07/2011--- next 5 years cscope 07-2016 (-), 10 years -Prostate cancer screening: DRE PSA 2020: wnl  -Labs:  CMP, FLP, CBC with blood smear, TSH, A1c, UA -Has a healthy lifestyle.

## 2020-08-31 ENCOUNTER — Encounter: Payer: Self-pay | Admitting: Nurse Practitioner

## 2020-09-22 ENCOUNTER — Ambulatory Visit (INDEPENDENT_AMBULATORY_CARE_PROVIDER_SITE_OTHER): Payer: 59 | Admitting: Nurse Practitioner

## 2020-09-22 ENCOUNTER — Encounter: Payer: Self-pay | Admitting: Nurse Practitioner

## 2020-09-22 ENCOUNTER — Other Ambulatory Visit (INDEPENDENT_AMBULATORY_CARE_PROVIDER_SITE_OTHER): Payer: 59

## 2020-09-22 VITALS — BP 120/70 | HR 74 | Ht 70.5 in | Wt 158.0 lb

## 2020-09-22 DIAGNOSIS — R197 Diarrhea, unspecified: Secondary | ICD-10-CM | POA: Diagnosis not present

## 2020-09-22 DIAGNOSIS — R634 Abnormal weight loss: Secondary | ICD-10-CM

## 2020-09-22 DIAGNOSIS — R109 Unspecified abdominal pain: Secondary | ICD-10-CM

## 2020-09-22 DIAGNOSIS — R10A1 Flank pain, right side: Secondary | ICD-10-CM

## 2020-09-22 LAB — CBC WITH DIFFERENTIAL/PLATELET
Basophils Absolute: 0.1 10*3/uL (ref 0.0–0.1)
Basophils Relative: 0.9 % (ref 0.0–3.0)
Eosinophils Absolute: 0 10*3/uL (ref 0.0–0.7)
Eosinophils Relative: 0.7 % (ref 0.0–5.0)
HCT: 42.3 % (ref 39.0–52.0)
Hemoglobin: 14.4 g/dL (ref 13.0–17.0)
Lymphocytes Relative: 20.4 % (ref 12.0–46.0)
Lymphs Abs: 1.2 10*3/uL (ref 0.7–4.0)
MCHC: 33.9 g/dL (ref 30.0–36.0)
MCV: 92.1 fl (ref 78.0–100.0)
Monocytes Absolute: 0.4 10*3/uL (ref 0.1–1.0)
Monocytes Relative: 6.9 % (ref 3.0–12.0)
Neutro Abs: 4.2 10*3/uL (ref 1.4–7.7)
Neutrophils Relative %: 71.1 % (ref 43.0–77.0)
Platelets: 194 10*3/uL (ref 150.0–400.0)
RBC: 4.59 Mil/uL (ref 4.22–5.81)
RDW: 12.5 % (ref 11.5–15.5)
WBC: 5.9 10*3/uL (ref 4.0–10.5)

## 2020-09-22 LAB — COMPREHENSIVE METABOLIC PANEL
ALT: 14 U/L (ref 0–53)
AST: 16 U/L (ref 0–37)
Albumin: 4.3 g/dL (ref 3.5–5.2)
Alkaline Phosphatase: 93 U/L (ref 39–117)
BUN: 14 mg/dL (ref 6–23)
CO2: 32 mEq/L (ref 19–32)
Calcium: 9.4 mg/dL (ref 8.4–10.5)
Chloride: 104 mEq/L (ref 96–112)
Creatinine, Ser: 1.32 mg/dL (ref 0.40–1.50)
GFR: 57.83 mL/min — ABNORMAL LOW (ref >60.00)
Glucose, Bld: 131 mg/dL — ABNORMAL HIGH (ref 70–99)
Potassium: 4.6 mEq/L (ref 3.5–5.1)
Sodium: 140 mEq/L (ref 135–145)
Total Bilirubin: 0.7 mg/dL (ref 0.2–1.2)
Total Protein: 6.7 g/dL (ref 6.0–8.3)

## 2020-09-22 NOTE — Progress Notes (Signed)
09/22/2020 Bradley Meyer [id number] 1956-08-04   CHIEF COMPLAINT: weight loss  HISTORY OF PRESENT ILLNESS: Bradley Meyer  Is a 64 year old male with a past medical history of kidney stones, basal cell cancer to his nose 2006 and colon polyps. He presents to our office today as referred by Dr. Larose Kells for further evaluation regarding weight loss, chronic loose stools and chronic dysphagia. He has unintentionally lost 7lbs over the past year. He has night sweats. No fever. He reports having loose stools for 10 years. He passes 2 loose "pastey" stools every morning. He passes a solid formed brown stool for a few days monthly. His stools often float. No oily stools. No rectal bleeding or black stools. No recent antibiotics or new medications. He describes having food or pills which briefly get stuck in his throat which occurs daily for many years. No heartburn or upper abdominal pain. He has some difficulty recalling exactly how long he's had lose stools and dysphagia but he stated having these symptoms in 2017 when he underwent an EGD and colonoscopy with Dr. Ardis Hughs 07/29/2016. The EGD showed a normal esophagus, esophageal dilatation was not done and gastritis. No evidence of H. Pylori. The colonoscopy identified 2 polyps to the ascending colon which were removed, biopsies showed benign polypoid colonic mucosa. Diverticulosis. Biopsies were negative for IBD/microscopic colitis. Mild melanosis coli was noted. He denies laxative use. No family history of IBD or colon cancer. He also complains of right sided pain which starts at the right lateral rib cage border down to his hip. He is not sure if eating worsens or improves this pain. His right sided pain is not provoked or worsened by change of position.  Labs 07/23/2020: TSH 1.01. Glucose 93. BUN 15. Cr. 1.28 (0.7-1.25). AST 18. ALT 16. Alk phos 92. T. Bili 0.8. WBC 5.6. Hg 14.8. HCT 43.7. PLT 215. HGA1C 5.2.  EGD by Dr. Ardis Hughs 07/29/2016:   Normal esophagus. - Gastritis. Biopsied. - Normal examined duodenum.  Colonoscopy 07/29/2016: - The examined portion of the ileum was normal. - Two 2 to 3 mm polyps in the ascending colon, removed with a cold snare. Resected and retrieved. - Diverticulosis in the left colon. - Colon was randomly biopsied (for chronic loose stools) - The examination was otherwise normal on direct and retroflexion views. Biopsy Report: 1. Surgical [P], ascending, polyp (2) - POLYPOID COLONIC MUCOSAL FRAGMENTS WITH LYMPHOID AGGREGATES. - NO ADENOMATOUS EPITHELIUM OR MALIGNANCY IDENTIFIED. 2. Surgical [P], random sites - BENIGN COLONIC MUCOSA WITH MILD MELANOSIS COLI. - NO COLITIS, GRANULOMATA OR ADENOMATOUS EPITHELIUM IDENTIFIED. 3. Surgical [P], gastric antrum and gastric body - BENIGN GASTRIC MUCOSA. NO HELICOBACTER PYLORI, INTESTINAL METAPLASIA OR ACTIVE INFLAMMATION IDENTIFIED.   Past Medical History:  Diagnosis Date  . BCC (basal cell carcinoma of skin)    nose 2006  . Colon polyp   . Kidney stones   . Palpitation     2010, saw [facility] (w/u per patient negative)    . Vitreous detachment 05-2010   Past Surgical History:  Procedure Laterality Date  . APPENDECTOMY  1971  . KNEE ARTHROSCOPY  2002   right  . NASAL SEPTUM SURGERY  05/2018  . POLYPECTOMY     Social History: He is married. He is an Chief Financial Officer. He smoked cigarettes socially, quit 15 years ago. He drinks one beer once weekly or less. No drug use.   Family History: Mother age 93 history of colon polyps. Father age 77 bladder cancer.  Maternal uncle with pancreatic cancer. Maternal grandmother breast cancer. One sister with DM. Other sister healthy.   Medications: None  No Known Allergies   REVIEW OF SYSTEMS:  Gen: See HPI. CV: Denies chest pain, palpitations or edema. Resp: Denies cough, shortness of breath of hemoptysis.  GI: See HPI.   GU : Denies urinary burning, blood in urine, increased urinary frequency or  incontinence. MS: Denies joint pain, muscles aches or weakness. Derm: Denies rash, itchiness, skin lesions or unhealing ulcers. Psych: Denies depression, anxiety, memory loss, suicidal ideation and confusion. Heme: Denies bruising, bleeding. Neuro:  Denies headaches, dizziness or paresthesias. Endo:  Denies any problems with DM, thyroid or adrenal function.  PHYSICAL EXAM: BP 120/70   Pulse 74   Ht 5' 10.5" (1.791 m)   Wt 71.7 kg   SpO2 99%   BMI 22.35 kg/m  General: Well developed 64 year old male in no acute distress. Head: Normocephalic and atraumatic. Eyes:  Sclerae non-icteric, conjunctive pink. Ears: Normal auditory acuity. Mouth: Dentition intact. No ulcers or lesions.  Neck: Supple, no lymphadenopathy or thyromegaly.  Lungs: Clear bilaterally to auscultation without wheezes, crackles or rhonchi. Heart: Regular rate and rhythm. No murmur, rub or gallop appreciated.  Abdomen: Soft, nontender, non distended. No masses. No hepatosplenomegaly. Normoactive bowel sounds x 4 quadrants.  Rectal: Deferred.  Musculoskeletal: Symmetrical with no gross deformities. Skin: Warm and dry. No rash or lesions on visible extremities. Extremities: No edema. Neurological: Alert oriented x 4, no focal deficits.  Psychological:  Alert and cooperative. Normal mood and affect.  ASSESSMENT AND PLAN:  48. 64 year old male with unintentional weight loss (7lbs past year) with intermittent night sweats. Family history of pancreatic cancer.  -CBC, CMP to be completed prior to patient proceeding with CT with contrast -CTAP with oral and IV contrast to rule out intra-abdominal malignancy -May require Chest CT if CTAP negative, defer to PCP  2. Chronic diarrhea, stools intermittently float -CTAP as ordered above -TTG, IGA -Pancreatic elastase level to assess for pancreatic insufficiency  -Eventual repeat colonoscopy   3. Right sided pain extends from the right lateral rib cage border to the right  hip, etiology unclear -Await CTAP results   4. Chronic oral phase dysphagia -Consider barium swallow with tablet and eventual EGD  Further recommendations to be determined after the above labs and CTAP results received.  Patient to follow up in the office with Dr. Ardis Hughs in 3 to 4 weeks to further discuss scheduling an EGD and colonoscopy.    CC:  Colon Branch, MD

## 2020-09-22 NOTE — Patient Instructions (Addendum)
Your provider has requested that you go to the basement level for lab work before leaving today. Press "B" on the elevator. The lab is located at the first door on the left as you exit the elevator.  You can start an over the count probiotic of your choice once daily to help with diarrhea. This is optional.   Call our office if your symptoms get worse.   You have been scheduled for a CT scan of the abdomen and pelvis at [facility] ([address]---this is in the same building as [employer]).   You are scheduled on 09/28/20  at 4:30pm. You should arrive 15 minutes prior to your appointment time for registration. Please follow the written instructions below on the day of your exam:  WARNING: IF YOU ARE ALLERGIC TO IODINE/X-RAY DYE, PLEASE NOTIFY RADIOLOGY IMMEDIATELY AT [phone]! YOU WILL BE GIVEN A 13 HOUR PREMEDICATION PREP.  1) Do not eat or drink anything after 12:30pm (4 hours prior to your test) 2) You have been given 2 bottles of oral contrast to drink. The solution may taste better if refrigerated, but do NOT add ice or any other liquid to this solution. Shake well before drinking.    Drink 1 bottle of contrast @ 2:30pm (2 hours prior to your exam)  Drink 1 bottle of contrast @ 3:30pm (1 hour prior to your exam)  You may take any medications as prescribed with a small amount of water, if necessary. If you take any of the following medications: METFORMIN, GLUCOPHAGE, GLUCOVANCE, AVANDAMET, RIOMET, FORTAMET, [place] MET, JANUMET, GLUMETZA or METAGLIP, you MAY be asked to HOLD this medication 48 hours AFTER the exam.  The purpose of you drinking the oral contrast is to aid in the visualization of your intestinal tract. The contrast solution may cause some diarrhea. Depending on your individual set of symptoms, you may also receive an intravenous injection of x-ray contrast/dye. Plan on being at [facility] for 30 minutes or longer, depending on the type  of exam you are having performed.  This test typically takes 30-45 minutes to complete.  If you have any questions regarding your exam or if you need to reschedule, you may call the CT department at [phone] between the hours of 8:00 am and 5:00 pm, Monday-Friday.  ___________________________________________________________

## 2020-09-23 LAB — IGA: Immunoglobulin A: 175 mg/dL (ref 70–320)

## 2020-09-23 LAB — TISSUE TRANSGLUTAMINASE, IGA: (tTG) Ab, IgA: <1 U/mL

## 2020-09-23 NOTE — Progress Notes (Signed)
I agree with the above note, plan 

## 2020-09-28 ENCOUNTER — Ambulatory Visit (INDEPENDENT_AMBULATORY_CARE_PROVIDER_SITE_OTHER)
Admission: RE | Admit: 2020-09-28 | Discharge: 2020-09-28 | Disposition: A | Discharge status: Home / Self Care | Payer: 59 | Source: Ambulatory Visit | Attending: Nurse Practitioner | Admitting: Nurse Practitioner

## 2020-09-28 ENCOUNTER — Other Ambulatory Visit: Payer: Self-pay

## 2020-09-28 DIAGNOSIS — R197 Diarrhea, unspecified: Secondary | ICD-10-CM

## 2020-09-28 DIAGNOSIS — R634 Abnormal weight loss: Secondary | ICD-10-CM

## 2020-09-28 DIAGNOSIS — R109 Unspecified abdominal pain: Secondary | ICD-10-CM | POA: Diagnosis not present

## 2020-09-28 DIAGNOSIS — R10A1 Flank pain, right side: Secondary | ICD-10-CM

## 2020-09-28 LAB — PANCREATIC ELASTASE, FECAL: Pancreatic Elastase-1, Stool: >500 mcg/g

## 2020-09-28 MED ORDER — IOHEXOL 300 MG/ML  SOLN
100.0000 mL | Freq: Once | INTRAMUSCULAR | Status: AC | PRN
  Administered 2020-09-28: 100 mL via INTRAVENOUS

## 2020-11-03 ENCOUNTER — Encounter: Payer: Self-pay | Admitting: Gastroenterology

## 2020-11-03 ENCOUNTER — Ambulatory Visit (INDEPENDENT_AMBULATORY_CARE_PROVIDER_SITE_OTHER): Payer: 59 | Admitting: Gastroenterology

## 2020-11-03 VITALS — BP 110/70 | HR 65 | Ht 70.5 in | Wt 157.6 lb

## 2020-11-03 DIAGNOSIS — R195 Other fecal abnormalities: Secondary | ICD-10-CM | POA: Diagnosis not present

## 2020-11-03 NOTE — Patient Instructions (Signed)
If you are age 63 or older, your body mass index should be between 23-30. Your Body mass index is 22.29 kg/m. If this is out of the aforementioned range listed, please consider follow up with your Primary Care Provider.  If you are age 75 or younger, your body mass index should be between 19-25. Your Body mass index is 22.29 kg/m. If this is out of the aformentioned range listed, please consider follow up with your Primary Care Provider.   Due to recent changes in healthcare laws, you may see the results of your imaging and laboratory studies on MyChart before your provider has had a chance to review them.  We understand that in some cases there may be results that are confusing or concerning to you. Not all laboratory results come back in the same time frame and the provider may be waiting for multiple results in order to interpret others.  Please give [place] 48 hours in order for your provider to thoroughly review all the results before contacting the office for clarification of your results.   Please restart introducing gluten in your diet today.  We will check lab work in 4 weeks after restarting gluten.  Your provider has requested that you go to the basement level for lab work in 4 weeks (12-01-2020). Press "B" on the elevator. The lab is located at the first door on the left as you exit the elevator.  Thank you for entrusting me with your care and choosing [facility].  Dr Ardis Hughs

## 2020-11-03 NOTE — Progress Notes (Signed)
HPI: This is a very pleasant 65 year old man   loose stools and dysphagia these symptoms in 2017 when he underwent an EGD and colonoscopy with Dr. Ardis Hughs 07/29/2016. The EGD showed a normal esophagus, esophageal dilatation was not done and gastritis. No evidence of H. Pylori. The colonoscopy identified 2 polyps to the ascending colon which were removed, biopsies showed benign polypoid colonic mucosa. Diverticulosis. Biopsies were negative for IBD/microscopic colitis. Mild melanosis coli was noted.   He was last here in our office about 6 weeks ago and he saw [facility].  This was for chronic loose stools and unintentional weight loss.  He had a family history of pancreatic cancer.  She arrange for multiple tests including blood work, CT scanning.  Lab testing December 2021 CBC was normal, complete metabolic profile was normal, celiac sprue serologies were negative.  Pancreatic elastase was negative.  CT scan abdomen pelvis with IV and oral contrast was unrevealing as well.  His weight 6 weeks ago was 158 pounds.  He has 1 or 2 loose stools just about every single morning.  He does not take any laxatives.  He has lost 6 pounds in about 12 months.  He started eating more sugary foods, more junk about 3 weeks ago and gained 3 pounds in 2 weeks.  He does not see blood in the stool.  He has no nausea or vomiting.  He is a vegetarian and eats a vegan.  He avoids gluten as well.  He is quite active bikes plays tennis.  ROS: complete GI ROS as described in HPI, all other review negative.  Constitutional:  No unintentional weight loss   Past Medical History:  Diagnosis Date  . BCC (basal cell carcinoma of skin)    nose 2006  . Colon polyp   . Kidney stones   . Palpitation     2010, saw [facility]iology (w/u per patient negative)    . Vitreous detachment 05-2010    Past Surgical History:  Procedure Laterality Date  . APPENDECTOMY  1971  . KNEE ARTHROSCOPY  2002   right  . NASAL SEPTUM SURGERY   05/2018  . POLYPECTOMY      No current outpatient medications on file.   No current facility-administered medications for this visit.    Allergies as of 11/03/2020  . (No Known Allergies)    Family History  Problem Relation Age of Onset  . Prostate cancer Father 92  . Bladder Cancer Father   . CAD Other        uncle, in his 60s  . Colon polyps Mother   . Colon cancer Neg Hx   . Stomach cancer Neg Hx   . Diabetes Neg Hx   . Kidney disease Neg Hx   . Gallbladder disease Neg Hx   . Esophageal cancer Neg Hx   . Rectal cancer Neg Hx     Social History   Socioeconomic History  . Marital status: Married    Spouse name: Not on file  . Number of children: 0  . Years of education: Not on file  . Highest education level: Not on file  Occupational History  . Occupation: Art gallery manager , Garment/textile technologist  . Occupation: works from home   Tobacco Use  . Smoking status: Former Smoker    Packs/day: 0.10    Years: 12.00    Pack years: 1.20    Types: Cigarettes    Quit date: 07/01/2007    Years since quitting: 13.3  . Smokeless tobacco:  Never Used  Substance and Sexual Activity  . Alcohol use: Yes    Alcohol/week: 1.0 standard drink    Types: 1 Cans of beer per week    Comment: Occassionally  . Drug use: No  . Sexual activity: Not on file  Other Topics Concern  . Not on file  Social History Narrative   moved here from [place] 10-2008     Occupation: Art gallery manager     Married , no children         Social Determinants of Radio broadcast assistant Strain: Not on file  Food Insecurity: Not on file  Transportation Needs: Not on file  Physical Activity: Not on file  Stress: Not on file  Social Connections: Not on file  Intimate Partner Violence: Not on file     Physical Exam:  BP 110/70   Pulse 65   Ht 5' 10.5" (1.791 m)   Wt 157 lb 9.6 oz (71.5 kg)   SpO2 99%   BMI 22.29 kg/m   Psychiatric: alert and oriented x3 Abdomen: soft, nontender, nondistended, no  obvious ascites, no peritoneal signs, normal bowel sounds No peripheral edema noted in lower extremities  Assessment and plan: 65 y.o. male with chronic loose stools, recent mild weight loss  He does not have microscopic colitis based on previous colonoscopy and biopsies.  He does not have nausea or vomiting.  He denies dysphagia.  He has no abdominal pains.  I recommended a trial of scheduled daily a.m. Imodium.  I want to recheck celiac sprue serologies after he resumes gluten in his diet in about 4 weeks.  Please see the "Patient Instructions" section for addition details about the plan.  Owens Loffler, MD [facility] 11/03/2020, 10:14 AM   Total time on date of encounter was 30 minutes (this included time spent preparing to see the patient reviewing records; obtaining and/or reviewing separately obtained history; performing a medically appropriate exam and/or evaluation; counseling and educating the patient and family if present; ordering medications, tests or procedures if applicable; and documenting clinical information in the health record).

## 2020-11-23 ENCOUNTER — Ambulatory Visit (INDEPENDENT_AMBULATORY_CARE_PROVIDER_SITE_OTHER): Payer: 59 | Admitting: Internal Medicine

## 2020-11-23 ENCOUNTER — Other Ambulatory Visit: Payer: Self-pay

## 2020-11-23 VITALS — BP 122/80 | HR 64 | Temp 97.9°F | Ht 70.5 in | Wt 161.8 lb

## 2020-11-23 DIAGNOSIS — I700 Atherosclerosis of aorta: Secondary | ICD-10-CM

## 2020-11-23 DIAGNOSIS — R634 Abnormal weight loss: Secondary | ICD-10-CM

## 2020-11-23 NOTE — Patient Instructions (Signed)
   GO TO THE FRONT DESK, PLEASE SCHEDULE YOUR APPOINTMENTS Come back for a physical exam by October

## 2020-11-23 NOTE — Progress Notes (Signed)
Subjective:    Patient ID: Bradley Meyer, male    DOB: 19-Jun-1956, 65 y.o.   MRN: [id number]  DOS:  11/23/2020 Type of visit - description: Routine visit Here to recheck his weight. Saw GI twice, notes reviewed. Since the last visit he realized that because he was working from home, he was not snacking as before and  eating even healthier than he used to, thus  increased his calorie intake.  Has gained weight.  Wt Readings from Last 3 Encounters:  11/23/20 161 lb 12.8 oz (73.4 kg)  11/03/20 157 lb 9.6 oz (71.5 kg)  09/22/20 158 lb (71.7 kg)     Review of Systems He remains active and feels great Denies fever chills No chest pain, difficulty breathing, cough or wheezing  Past Medical History:  Diagnosis Date  . BCC (basal cell carcinoma of skin)    nose 2006  . Colon polyp   . Kidney stones   . Palpitation     2010, saw [facility] Cardiology (w/u per patient negative)    . Vitreous detachment 05-2010    Past Surgical History:  Procedure Laterality Date  . APPENDECTOMY  1971  . KNEE ARTHROSCOPY  2002   right  . NASAL SEPTUM SURGERY  05/2018  . POLYPECTOMY      Allergies as of 11/23/2020   No Known Allergies     Medication List       Accurate as of November 23, 2020  9:25 AM. If you have any questions, ask your nurse or doctor.        VITAMIN D3 PO Take 2 tablets by mouth daily.   Zinc 25 MG Tabs Take 25 mg by mouth daily.          Objective:   Physical Exam BP 122/80 (BP Location: Right Arm, Patient Position: Sitting, Cuff Size: Large)   Pulse 64   Temp 97.9 F (36.6 C) (Oral)   Ht 5' 10.5" (1.791 m)   Wt 161 lb 12.8 oz (73.4 kg)   SpO2 99%   BMI 22.89 kg/m  General:   Well developed, NAD, BMI noted. HEENT:  Normocephalic . Face symmetric, atraumatic Neck: No lymphadenopathies Lungs:  CTA B Normal respiratory effort, no intercostal retractions, no accessory muscle use. Heart: RRR,  no murmur.  Lower extremities: no pretibial edema  bilaterally  Skin: Not pale. Not jaundice Neurologic:  alert & oriented X3.  Speech normal, gait appropriate for age and unassisted Psych--  Cognition and judgment appear intact.  Cooperative with normal attention span and concentration.  Behavior appropriate. No anxious or depressed appearing.      Assessment     Assessment H/o Anxiety H/o Palpitations 2010, saw cards, workup (-) per pt BCC nose 2016, sees derm q year Vitreous detachment 2011 Chronic abdominal pain: 2017- saw GI, EGD, colonoscopy, biopsies, CT unrevealing. Sx resolved with gluten-free diet and other dietary changes Vegetarian   PLAN: Weight loss:  Saw GI 09/22/2020, labs showing no evidence of pancreatic insufficiency, CT Abdomen and pelvis-  nonacute or relevant findings Seen again with chronic loose stools 11/03/2020, Rx schedule Imodium, additional labs were Rx to rule out celiac disease but patient declined to pursue. At some point at home his weight went down to 148 Lb, he started to increase his calorie intake and now his weight is going up. For now we agreed on no further testing since apparently increasing calorie intake solve the problem. We could consider CT chest or a  testosterone levels  in the future. For diarrhea, he does not like the idea to take Imodium daily, I rec perhaps cut down on the fruits and  take Metamucil daily. Aortic atherosclerosis: Per CT, plan is CV RF control RTC 07-2021 CPX   This visit occurred during the SARS-CoV-2 public health emergency.  Safety protocols were in place, including screening questions prior to the visit, additional usage of staff PPE, and extensive cleaning of exam room while observing appropriate contact time as indicated for disinfecting solutions.

## 2020-11-24 DIAGNOSIS — I700 Atherosclerosis of aorta: Secondary | ICD-10-CM | POA: Insufficient documentation

## 2020-11-24 NOTE — Assessment & Plan Note (Signed)
Weight loss:  Saw GI 09/22/2020, labs showing no evidence of pancreatic insufficiency, CT Abdomen and pelvis-  nonacute or relevant findings Seen again with chronic loose stools 11/03/2020, Rx schedule Imodium, additional labs were Rx to rule out celiac disease but patient declined to pursue. At some point at home his weight went down to 148 Lb, he started to increase his calorie intake and now his weight is going up. For now we agreed on no further testing since apparently increasing calorie intake solve the problem. We could consider CT chest or a  testosterone levels in the future. For diarrhea, he does not like the idea to take Imodium daily, I rec perhaps cut down on the fruits and  take Metamucil daily. Aortic atherosclerosis: Per CT, plan is CV RF control RTC 07-2021 CPX

## 2021-02-16 ENCOUNTER — Encounter: Payer: Self-pay | Admitting: Internal Medicine

## 2021-04-29 ENCOUNTER — Ambulatory Visit: Payer: 59 | Admitting: Internal Medicine

## 2021-07-22 ENCOUNTER — Other Ambulatory Visit: Payer: Self-pay

## 2021-07-23 ENCOUNTER — Ambulatory Visit (INDEPENDENT_AMBULATORY_CARE_PROVIDER_SITE_OTHER): Payer: 59 | Admitting: Internal Medicine

## 2021-07-23 ENCOUNTER — Encounter: Payer: Self-pay | Admitting: Internal Medicine

## 2021-07-23 VITALS — BP 122/70 | HR 73 | Temp 97.6°F | Resp 16 | Ht 71.0 in | Wt 157.2 lb

## 2021-07-23 DIAGNOSIS — R5383 Other fatigue: Secondary | ICD-10-CM

## 2021-07-23 DIAGNOSIS — Z0000 Encounter for general adult medical examination without abnormal findings: Secondary | ICD-10-CM | POA: Diagnosis not present

## 2021-07-23 DIAGNOSIS — Z0001 Encounter for general adult medical examination with abnormal findings: Secondary | ICD-10-CM

## 2021-07-23 DIAGNOSIS — R932 Abnormal findings on diagnostic imaging of liver and biliary tract: Secondary | ICD-10-CM | POA: Diagnosis not present

## 2021-07-23 DIAGNOSIS — Z23 Encounter for immunization: Secondary | ICD-10-CM | POA: Diagnosis not present

## 2021-07-23 DIAGNOSIS — R399 Unspecified symptoms and signs involving the genitourinary system: Secondary | ICD-10-CM

## 2021-07-23 DIAGNOSIS — Z125 Encounter for screening for malignant neoplasm of prostate: Secondary | ICD-10-CM

## 2021-07-23 LAB — COMPREHENSIVE METABOLIC PANEL
ALT: 13 U/L (ref 0–53)
AST: 16 U/L (ref 0–37)
Albumin: 4.3 g/dL (ref 3.5–5.2)
Alkaline Phosphatase: 79 U/L (ref 39–117)
BUN: 16 mg/dL (ref 6–23)
CO2: 28 mEq/L (ref 19–32)
Calcium: 9.5 mg/dL (ref 8.4–10.5)
Chloride: 104 mEq/L (ref 96–112)
Creatinine, Ser: 1.14 mg/dL (ref 0.40–1.50)
GFR: 68.55 mL/min (ref >60.00)
Glucose, Bld: 93 mg/dL (ref 70–99)
Potassium: 4.5 mEq/L (ref 3.5–5.1)
Sodium: 139 mEq/L (ref 135–145)
Total Bilirubin: 0.9 mg/dL (ref 0.2–1.2)
Total Protein: 6.3 g/dL (ref 6.0–8.3)

## 2021-07-23 LAB — URINALYSIS, ROUTINE W REFLEX MICROSCOPIC
Bilirubin Urine: NEGATIVE
Hgb urine dipstick: NEGATIVE
Ketones, ur: NEGATIVE
Leukocytes,Ua: NEGATIVE
Nitrite: NEGATIVE
Specific Gravity, Urine: 1.02 (ref 1.000–1.030)
Total Protein, Urine: NEGATIVE
Urine Glucose: NEGATIVE
Urobilinogen, UA: 0.2 (ref 0.0–1.0)
pH: 6 (ref 5.0–8.0)

## 2021-07-23 LAB — CBC WITH DIFFERENTIAL/PLATELET
Basophils Absolute: 0 10*3/uL (ref 0.0–0.1)
Basophils Relative: 0.7 % (ref 0.0–3.0)
Eosinophils Absolute: 0 10*3/uL (ref 0.0–0.7)
Eosinophils Relative: 0.8 % (ref 0.0–5.0)
HCT: 41.2 % (ref 39.0–52.0)
Hemoglobin: 13.6 g/dL (ref 13.0–17.0)
Lymphocytes Relative: 19.5 % (ref 12.0–46.0)
Lymphs Abs: 1.1 10*3/uL (ref 0.7–4.0)
MCHC: 33.1 g/dL (ref 30.0–36.0)
MCV: 93.4 fl (ref 78.0–100.0)
Monocytes Absolute: 0.5 10*3/uL (ref 0.1–1.0)
Monocytes Relative: 8.8 % (ref 3.0–12.0)
Neutro Abs: 3.9 10*3/uL (ref 1.4–7.7)
Neutrophils Relative %: 70.2 % (ref 43.0–77.0)
Platelets: 207 10*3/uL (ref 150.0–400.0)
RBC: 4.42 Mil/uL (ref 4.22–5.81)
RDW: 12.7 % (ref 11.5–15.5)
WBC: 5.5 10*3/uL (ref 4.0–10.5)

## 2021-07-23 LAB — TSH: TSH: 0.82 u[IU]/mL (ref 0.35–5.50)

## 2021-07-23 LAB — PSA: PSA: 2.42 ng/mL (ref 0.10–4.00)

## 2021-07-23 LAB — LIPID PANEL
Cholesterol: 158 mg/dL (ref 0–200)
HDL: 59.6 mg/dL (ref >39.00)
LDL Cholesterol: 85 mg/dL (ref 0–99)
NonHDL: 98.01
Total CHOL/HDL Ratio: 3
Triglycerides: 67 mg/dL (ref 0.0–149.0)
VLDL: 13.4 mg/dL (ref 0.0–40.0)

## 2021-07-23 LAB — HEMOGLOBIN A1C: Hgb A1c MFr Bld: 5.5 % (ref 4.6–6.5)

## 2021-07-23 LAB — TESTOSTERONE: Testosterone: 545.14 ng/dL (ref 300.00–890.00)

## 2021-07-23 LAB — B12 AND FOLATE PANEL
Folate: 14.8 ng/mL (ref >5.9)
Vitamin B-12: 295 pg/mL (ref 211–911)

## 2021-07-23 NOTE — Progress Notes (Signed)
Subjective:    Patient ID: Bradley Meyer, male    DOB: May 12, 1956, 65 y.o.   MRN: [id number]  DOS:  07/23/2021 Type of visit - description: CPX  Here for CPX, multiple other issues discussed. His recovery time after exercise has decreased, requested testosterone check.  Denies decreased libido or ED. Concerned about occasional postvoiding dribbling. Denies dysuria gross hematuria or difficulty urinating. Continue with chronic diarrhea Request B12 check Since the last visit developed by herniated disc, doing physical therapy, feeling somewhat better Had an abnormal MRI, see below.  Wt Readings from Last 3 Encounters:  07/23/21 157 lb 4 oz (71.3 kg)  11/23/20 161 lb 12.8 oz (73.4 kg)  11/03/20 157 lb 9.6 oz (71.5 kg)    Review of Systems  Other than above, a 14 point review of systems is negative       Past Medical History:  Diagnosis Date   BCC (basal cell carcinoma of skin)    nose 2006   Colon polyp    Kidney stones    Palpitation     2010, saw [facility] (w/u per patient negative)     Vitreous detachment 05-2010    Past Surgical History:  Procedure Laterality Date   APPENDECTOMY  1971   KNEE ARTHROSCOPY  2002   right   NASAL SEPTUM SURGERY  05/2018   POLYPECTOMY     Social History   Socioeconomic History   Marital status: Married    Spouse name: Not on file   Number of children: 0   Years of education: Not on file   Highest education level: Not on file  Occupational History   Occupation: Art gallery manager , Garment/textile technologist   Occupation: works from home   Tobacco Use   Smoking status: Former    Packs/day: 0.10    Years: 12.00    Pack years: 1.20    Types: Cigarettes    Quit date: 07/01/2007    Years since quitting: 14.0   Smokeless tobacco: Never  Vaping Use   Vaping Use: Never used  Substance and Sexual Activity   Alcohol use: Yes    Comment: Occassionally   Drug use: No   Sexual activity: Not Currently  Other Topics Concern   Not on  file  Social History Narrative   moved here from [place] 10-2008     Occupation: Art gallery manager     Married , no children         Social Determinants of Health   Financial Resource Strain: Not on file  Food Insecurity: Not on file  Transportation Needs: Not on file  Physical Activity: Not on file  Stress: Not on file  Social Connections: Not on file  Intimate Partner Violence: Not on file    Allergies as of 07/23/2021   No Known Allergies      Medication List        Accurate as of July 23, 2021 11:59 PM. If you have any questions, ask your nurse or doctor.          VITAMIN D3 PO Take 2 tablets by mouth daily.   Zinc 25 MG Tabs Take 25 mg by mouth daily.           Objective:   Physical Exam BP 122/70 (BP Location: Left Arm, Patient Position: Sitting, Cuff Size: Small)   Pulse 73   Temp 97.6 F (36.4 C) (Oral)   Resp 16   Ht 5\' 11"  (1.803 m)   Wt 157 lb  4 oz (71.3 kg)   SpO2 98%   BMI 21.93 kg/m  General: Well developed, NAD, BMI noted Neck: No  thyromegaly  HEENT:  Normocephalic . Face symmetric, atraumatic Lungs:  CTA B Normal respiratory effort, no intercostal retractions, no accessory muscle use. Heart: RRR,  no murmur.  Abdomen:  Not distended, soft, non-tender. No rebound or rigidity.   Lower extremities: no pretibial edema bilaterally DRE: Normal sphincter tone, no stools, prostate is slightly enlarged but is not nodular or tender Skin: Exposed areas without rash. Not pale. Not jaundice Neurologic:  alert & oriented X3.  Speech normal, gait appropriate for age and unassisted Strength symmetric and appropriate for age.  Psych: Cognition and judgment appear intact.  Cooperative with normal attention span and concentration.  Behavior appropriate. No anxious or depressed appearing.     Assessment      Assessment H/o Anxiety H/o Palpitations 2010, saw cards, workup (-) per pt BCC nose 2016, sees derm q year Vitreous detachment  2011 Chronic abdominal pain: 2017- saw GI, EGD, colonoscopy, biopsies, CT unrevealing. Sx resolved with gluten-free diet and other dietary changes Vegetarian  Covid infex per pt 12-2020, no RX  PLAN: Here for CPX Fatigue: Reports prolonging recovery time after exercise, request testosterone check. Abnormal liver MRI, MRI 05-18-21 order for back pain, it showed DJD and "Paraspinal and other soft tissues: A 1.8 cm and a 0.5 cm T2  hyperintense hepatic lesions".  Plan: US abdomen. Herniated disc, back pain: Per sports medicine. LUTS: Mild falls voiding dribbling, DRE slightly enlarged prostate, checking a PSA, UA urine culture Weight loss: Has lost 4 pounds, reassess in 6 months RTC 6 months    In addition to CPX, we addressed multiple other issues including fatigue, abnormal liver MRI LUTS  This visit occurred during the SARS-CoV-2 public health emergency.  Safety protocols were in place, including screening questions prior to the visit, additional usage of staff PPE, and extensive cleaning of exam room while observing appropriate contact time as indicated for disinfecting solutions.

## 2021-07-23 NOTE — Patient Instructions (Addendum)
   GO TO THE LAB : Get the blood work     [place], [place] back for   a checkup in 6 months  STOP BY THE FIRST FLOOR: Arrange for your abdominal ultrasound to check your liver

## 2021-07-24 LAB — URINE CULTURE
MICRO NUMBER:: 12535054
Result:: NO GROWTH
SPECIMEN QUALITY:: ADEQUATE

## 2021-07-25 ENCOUNTER — Encounter: Payer: Self-pay | Admitting: Internal Medicine

## 2021-07-25 NOTE — Assessment & Plan Note (Signed)
-  Td 2016 - s/p  shingrix -COVID VAX: Booster recommended, strongly declines -flu shot : today -CCS: 05[place] biopsies were normal, terminal ileum was normal appearing and normal by biopsy, 81mm pedunculated TVA was removed from rectum Colonoscopy 07/2011--- next 5 years cscope 07-2016 (-), 10 years -Prostate cancer screening: See comments under LUTS, check PSA, UA urine culture -Labs:   A1c, B12, UA, urine culture, PSA, total testosterone,CMP, FLP, CBC, TSH -Has a healthy lifestyle.  (Not exercising as much lately due to back pain)

## 2021-07-25 NOTE — Assessment & Plan Note (Signed)
Here for CPX Fatigue: Reports prolonging recovery time after exercise, request testosterone check. Abnormal liver MRI, MRI 05-18-21 order for back pain, it showed DJD and "Paraspinal and other soft tissues: A 1.8 cm and a 0.5 cm T2  hyperintense hepatic lesions".  Plan: US abdomen. Herniated disc, back pain: Per sports medicine. LUTS: Mild falls voiding dribbling, DRE slightly enlarged prostate, checking a PSA, UA urine culture Weight loss: Has lost 4 pounds, reassess in 6 months RTC 6 months

## 2021-07-26 ENCOUNTER — Telehealth: Payer: Self-pay

## 2021-07-26 NOTE — Telephone Encounter (Signed)
Physical form completed and faxed back to [facility] at [phone]. Form sent for scanning.

## 2021-07-27 ENCOUNTER — Ambulatory Visit (HOSPITAL_BASED_OUTPATIENT_CLINIC_OR_DEPARTMENT_OTHER): Payer: 59

## 2021-07-28 ENCOUNTER — Encounter: Payer: Self-pay | Admitting: Internal Medicine

## 2021-07-28 ENCOUNTER — Other Ambulatory Visit: Payer: Self-pay

## 2021-07-28 ENCOUNTER — Ambulatory Visit (INDEPENDENT_AMBULATORY_CARE_PROVIDER_SITE_OTHER): Payer: 59 | Admitting: Internal Medicine

## 2021-07-28 VITALS — BP 124/69 | HR 69 | Temp 98.2°F | Resp 16 | Ht 71.0 in | Wt 157.2 lb

## 2021-07-28 DIAGNOSIS — K7689 Other specified diseases of liver: Secondary | ICD-10-CM | POA: Diagnosis not present

## 2021-07-28 NOTE — Progress Notes (Signed)
Subjective:    Patient ID: Bradley Meyer, male    DOB: 02-14-1956, 65 y.o.   MRN: [id number]  DOS:  07/28/2021 Type of visit - description: Acute  The patient remains concerned about the  MRI findings (05/18/2021).  They described hepatic lesions.  We reviewed together the CAT scans from 2017 and 2021, see below. I also talked with the radiologist about these 3 images.  CT abdomen 2017: Multiple liver cysts noted. The gallbladder appears normal. There is no biliary dilatation.     CT abdomen 09-2020 Hepatobiliary: 2.5 cm low-attenuation lesion in the right lobe of the liver, compatible with a simple cyst. Several other subcentimeter low-attenuation lesions scattered throughout the hepatic parenchyma, too small to definitively characterize, but statistically likely to represent tiny cysts.    Adrenals/Urinary Tract: Subcentimeter low-attenuation lesion in the interpolar region of the right kidney, too small to definitively characterize, but statistically likely to represent a tiny cyst.   MRI spine 05/18/2021  Paraspinal and other soft tissues: A 1.8 cm and a 0.5 cm T2  hyperintense hepatic lesions.   Review of Systems See above   Past Medical History:  Diagnosis Date   BCC (basal cell carcinoma of skin)    nose 2006   Colon polyp    Kidney stones    Palpitation     2010, saw [facility] (w/u per patient negative)     Vitreous detachment 05-2010    Past Surgical History:  Procedure Laterality Date   APPENDECTOMY  1971   KNEE ARTHROSCOPY  2002   right   NASAL SEPTUM SURGERY  05/2018   POLYPECTOMY      Allergies as of 07/28/2021   No Known Allergies      Medication List        Accurate as of July 28, 2021  4:28 PM. If you have any questions, ask your nurse or doctor.          VITAMIN D3 PO Take 2 tablets by mouth daily.   Zinc 25 MG Tabs Take 25 mg by mouth daily.           Objective:   Physical Exam BP 124/69 (BP Location:  Left Arm, Patient Position: Sitting, Cuff Size: Small)   Pulse 69   Temp 98.2 F (36.8 C) (Oral)   Resp 16   Ht 5\' 11"  (1.803 m)   Wt 157 lb 4 oz (71.3 kg)   SpO2 97%   BMI 21.93 kg/m  Alert oriented x3.  No distress.     Assessment    Assessment H/o Anxiety H/o Palpitations 2010, saw cards, workup (-) per pt BCC nose 2016, sees derm q year Vitreous detachment 2011 Chronic abdominal pain: 2017- saw GI, EGD, colonoscopy, biopsies, CT unrevealing. Sx resolved with gluten-free diet and other dietary changes Vegetarian  Covid infex per pt 12-2020, no RX  PLAN: Liver cysts: Due to abnormal MRI of the spine with liver findings, at the last visit I ordered US, the patient canceled the ultrasound and came here to discuss the issue further.  He remains concerned. See HPI, I review the 2 previous abdominal CTs and the MRI report with the radiologist on-call: The fact that these liver cysts have been seen since 2017 is quite reassuring.. If further reassurance is needed, we could do ultrasound. I explained the patient the radiologist's opinion. We agree no further eval at this time, consider a liver US at some point in the future if patient still concerned. He  verbalized understanding and appreciation.      Time spent 35 minutes, reviewing imaging with the patient, with the radiologist, explaining him the rationale of the plan.  Multiple questions answered.  This visit occurred during the SARS-CoV-2 public health emergency.  Safety protocols were in place, including screening questions prior to the visit, additional usage of staff PPE, and extensive cleaning of exam room while observing appropriate contact time as indicated for disinfecting solutions.

## 2021-07-30 DIAGNOSIS — K7689 Other specified diseases of liver: Secondary | ICD-10-CM | POA: Insufficient documentation

## 2021-07-30 NOTE — Assessment & Plan Note (Signed)
Liver cysts: Due to abnormal MRI of the spine with liver findings, at the last visit I ordered US, the patient canceled the ultrasound and came here to discuss the issue further.  He remains concerned. See HPI, I review the 2 previous abdominal CTs and the MRI report with the radiologist on-call: The fact that these liver cysts have been seen since 2017 is quite reassuring.. If further reassurance is needed, we could do ultrasound. I explained the patient the radiologist's opinion. We agree no further eval at this time, consider a liver US at some point in the future if patient still concerned. He verbalized understanding and appreciation.

## 2021-10-03 HISTORY — PX: CATARACT EXTRACTION: SUR2

## 2022-01-21 ENCOUNTER — Telehealth: Payer: Self-pay

## 2022-01-21 ENCOUNTER — Encounter: Payer: Self-pay | Admitting: Internal Medicine

## 2022-01-21 ENCOUNTER — Ambulatory Visit (INDEPENDENT_AMBULATORY_CARE_PROVIDER_SITE_OTHER): Payer: 59 | Admitting: Internal Medicine

## 2022-01-21 VITALS — BP 120/68 | HR 71 | Temp 98.0°F | Resp 16 | Ht 71.0 in | Wt 156.4 lb

## 2022-01-21 DIAGNOSIS — I700 Atherosclerosis of aorta: Secondary | ICD-10-CM | POA: Diagnosis not present

## 2022-01-21 DIAGNOSIS — Z13220 Encounter for screening for lipoid disorders: Secondary | ICD-10-CM | POA: Diagnosis not present

## 2022-01-21 DIAGNOSIS — R5383 Other fatigue: Secondary | ICD-10-CM | POA: Diagnosis not present

## 2022-01-21 LAB — COMPREHENSIVE METABOLIC PANEL
ALT: 12 U/L (ref 0–53)
AST: 17 U/L (ref 0–37)
Albumin: 4.2 g/dL (ref 3.5–5.2)
Alkaline Phosphatase: 91 U/L (ref 39–117)
BUN: 14 mg/dL (ref 6–23)
CO2: 28 mEq/L (ref 19–32)
Calcium: 9.3 mg/dL (ref 8.4–10.5)
Chloride: 103 mEq/L (ref 96–112)
Creatinine, Ser: 1.21 mg/dL (ref 0.40–1.50)
GFR: 63.6 mL/min (ref >60.00)
Glucose, Bld: 89 mg/dL (ref 70–99)
Potassium: 4.7 mEq/L (ref 3.5–5.1)
Sodium: 138 mEq/L (ref 135–145)
Total Bilirubin: 1.1 mg/dL (ref 0.2–1.2)
Total Protein: 6.3 g/dL (ref 6.0–8.3)

## 2022-01-21 LAB — HEMOGLOBIN A1C: Hgb A1c MFr Bld: 5.6 % (ref 4.6–6.5)

## 2022-01-21 LAB — LIPID PANEL
Cholesterol: 158 mg/dL (ref 0–200)
HDL: 57.9 mg/dL (ref >39.00)
LDL Cholesterol: 85 mg/dL (ref 0–99)
NonHDL: 99.99
Total CHOL/HDL Ratio: 3
Triglycerides: 73 mg/dL (ref 0.0–149.0)
VLDL: 14.6 mg/dL (ref 0.0–40.0)

## 2022-01-21 NOTE — Progress Notes (Signed)
? ?  Subjective:  ? ? Patient ID: Bradley Meyer, male    DOB: Jan 18, 1956, 66 y.o.   MRN: [id number] ? ?DOS:  01/21/2022 ?Type of visit - description: rov ? ?Since the last office visit is doing well. ?he remains active. ?Had some ear discomfort, L, uses wax eardrops and likes ears  to be checked. ?Also needs some blood work for his job.   ? ?Review of Systems ?See above  ? ?Past Medical History:  ?Diagnosis Date  ? BCC (basal cell carcinoma of skin)   ? nose 2006  ? Colon polyp   ? Kidney stones   ? Palpitation   ?  2010, saw [facility] (w/u per patient negative)    ? Vitreous detachment 05-2010  ? ? ?Past Surgical History:  ?Procedure Laterality Date  ? APPENDECTOMY  1971  ? KNEE ARTHROSCOPY  2002  ? right  ? NASAL SEPTUM SURGERY  05/2018  ? POLYPECTOMY    ? ? ?Current Outpatient Medications  ?Medication Instructions  ? Cholecalciferol (VITAMIN D3 PO) 2 tablets, Oral, Daily  ? Zinc 25 mg, Oral, Daily  ? ? ?   ?Objective:  ? Physical Exam ?BP 120/68 (BP Location: Left Arm, Patient Position: Sitting, Cuff Size: Small)   Pulse 71   Temp 98 ?F (36.7 ?C) (Oral)   Resp 16   Ht '5\' 11"'$  (1.803 m)   Wt 156 lb 6 oz (70.9 kg)   SpO2 98%   BMI 21.81 kg/m?  ?General:   ?Well developed, NAD, BMI noted. ?HEENT:  ?Normocephalic . Face symmetric, atraumatic. ?Ears: TMs normal, minimal amount of wax on the right, no wax on the left ?Lungs:  ?CTA B ?Normal respiratory effort, no intercostal retractions, no accessory muscle use. ?Heart: RRR,  no murmur.  ?Lower extremities: no pretibial edema bilaterally  ?Skin: Not pale. Not jaundice ?Neurologic:  ?alert & oriented X3.  ?Speech normal, gait appropriate for age and unassisted ?Psych--  ?Cognition and judgment appear intact.  ?Cooperative with normal attention span and concentration.  ?Behavior appropriate. ?No anxious or depressed appearing.  ? ?   ?Assessment   ? ?  ?Assessment ?H/o Anxiety ?H/o Palpitations 2010, saw cards, workup (-) per pt ?BCC nose 2016, sees derm q  year ?Vitreous detachment 2011 ?Chronic abdominal pain: 2017- saw GI, EGD, colonoscopy, biopsies, CT unrevealing. Sx resolved with gluten-free diet and other dietary changes ?Vegetarian  ?Liver cysts-- see OV 07-28-2021 ? ? ?PLAN: ?History of fatigue: Much improved, checking labs ?History of back pain: Symptoms resolved ?Aortic sclerosis: Checking labs, very active w/o CV sxs. Reassess need for statins regularly ?Social: He remains quite active, to retire in August.  He is very excited about it. ?RTC 07-2022 CPX ? ?  ? ?This visit occurred during the SARS-CoV-2 public health emergency.  Safety protocols were in place, including screening questions prior to the visit, additional usage of staff PPE, and extensive cleaning of exam room while observing appropriate contact time as indicated for disinfecting solutions.  ? ?

## 2022-01-21 NOTE — Telephone Encounter (Signed)
Biometric screening form completed and faxed to [facility] at [phone]. Form sent for scanning.  ?

## 2022-01-21 NOTE — Patient Instructions (Signed)
? ? ?  GO TO THE LAB : Get the blood work   ? ? ?[place], [place] ?Come back for a physical exam by October ?

## 2022-01-23 NOTE — Assessment & Plan Note (Signed)
History of fatigue: Much improved, checking labs ?History of back pain: Symptoms resolved ?Aortic sclerosis: Checking labs, very active w/o CV sxs. Reassess need for statins regularly ?Social: He remains quite active, to retire in August.  He is very excited about it. ?RTC 07-2022 CPX ? ?  ?

## 2022-03-18 ENCOUNTER — Encounter (INDEPENDENT_AMBULATORY_CARE_PROVIDER_SITE_OTHER): Payer: Self-pay | Admitting: Ophthalmology

## 2022-03-18 ENCOUNTER — Ambulatory Visit (INDEPENDENT_AMBULATORY_CARE_PROVIDER_SITE_OTHER): Payer: 59 | Admitting: Ophthalmology

## 2022-03-18 DIAGNOSIS — H4311 Vitreous hemorrhage, right eye: Secondary | ICD-10-CM | POA: Diagnosis not present

## 2022-03-18 DIAGNOSIS — H43813 Vitreous degeneration, bilateral: Secondary | ICD-10-CM

## 2022-03-18 DIAGNOSIS — H33101 Unspecified retinoschisis, right eye: Secondary | ICD-10-CM | POA: Diagnosis not present

## 2022-03-18 DIAGNOSIS — Z9889 Other specified postprocedural states: Secondary | ICD-10-CM

## 2022-03-18 DIAGNOSIS — Z961 Presence of intraocular lens: Secondary | ICD-10-CM

## 2022-03-18 NOTE — Progress Notes (Addendum)
[facility] Note  03/18/2022     CHIEF COMPLAINT Patient presents for Retina Evaluation   HISTORY OF PRESENT ILLNESS: Bradley Meyer is a 66 y.o. male who presents to the clinic today for:   HPI     Retina Evaluation   In right eye.  This started 1 day ago.  Duration of 1 day.  Associated Symptoms Flashes.  I, the attending physician,  performed the HPI with the patient and updated documentation appropriately.        Comments   Pt here for ret eval due to experiecing FOL yesterday into this AM and now seeing spots of blood in [place] OD. Pt says images are a bit blurry beyond specs of blood. No shadows or veils reported. He reports this occurring seeing JDM for this same issue some years ago. Pt reports cataract extraction OU back in Jan 2023 by Dr. Alanda Slim.       Last edited by Bernarda Caffey, MD on 03/18/2022 11:17 AM.     Former patient of Dr. Zigmund Daniel for Retinal Tears OS.  Had Retinopexy at [facility] 06.18.12.  Last visit with Dr. Zigmund Daniel 12.21.16. History of high myopia prior to cataract sx -7.25 and -8.00 refraction.    All day yesterday he had FOLs.  Denies any active activities when this happen. Same symptoms as 2012 with Dr. Zigmund Daniel.   Referring physician: Colon Branch, MD [address] [place],  [place] [address]  HISTORICAL INFORMATION:   Selected notes from the MEDICAL RECORD NUMBER Referred by: self referral LEE:  Ocular Hx- PMH-    CURRENT MEDICATIONS: No current outpatient medications on file. (Ophthalmic Drugs)   No current facility-administered medications for this visit. (Ophthalmic Drugs)   Current Outpatient Medications (Other)  Medication Sig   Cholecalciferol (VITAMIN D3 PO) Take 2 tablets by mouth daily.   Zinc 25 MG TABS Take 25 mg by mouth daily.   No current facility-administered medications for this visit. (Other)   REVIEW OF SYSTEMS: ROS   Positive for: Eyes Negative for: Constitutional,  Gastrointestinal, Neurological, Skin, Genitourinary, Musculoskeletal, HENT, Endocrine, Cardiovascular, Respiratory, Psychiatric, Allergic/Imm, Heme/Lymph Last edited by Kingsley Spittle, COT on 03/18/2022 10:04 AM.     ALLERGIES No Known Allergies  PAST MEDICAL HISTORY Past Medical History:  Diagnosis Date   BCC (basal cell carcinoma of skin)    nose 2006   Colon polyp    Kidney stones    Palpitation     2010, saw [facility] Cardiology (w/u per patient negative)     Vitreous detachment 05-2010   Past Surgical History:  Procedure Laterality Date   APPENDECTOMY  1971   CATARACT EXTRACTION Bilateral 10/2021   Dr. Alanda Slim   KNEE ARTHROSCOPY  2002   right   NASAL SEPTUM SURGERY  05/2018   POLYPECTOMY     FAMILY HISTORY Family History  Problem Relation Age of Onset   Prostate cancer Father 37   Bladder Cancer Father    CAD Other        uncle, in his 66s   Colon polyps Mother    Colon cancer Neg Hx    Stomach cancer Neg Hx    Diabetes Neg Hx    Kidney disease Neg Hx    Gallbladder disease Neg Hx    Esophageal cancer Neg Hx    Rectal cancer Neg Hx    SOCIAL HISTORY Social History   Tobacco Use   Smoking status: Former  Packs/day: 0.10    Years: 12.00    Total pack years: 1.20    Types: Cigarettes    Quit date: 07/01/2007    Years since quitting: 14.7   Smokeless tobacco: Never  Vaping Use   Vaping Use: Never used  Substance Use Topics   Alcohol use: Yes    Comment: Occassionally   Drug use: No       OPHTHALMIC EXAM:  Base Eye Exam     Visual Acuity (Snellen - Linear)       Right Left   Dist [place] 20/25 -2 20/20 -2   Dist ph  20/20 -2 20/20 -1    Correction: Glasses         Tonometry (Tonopen, 10:11 AM)       Right Left   Pressure 15 15         Pupils       Dark Light Shape React APD   Right 3 2 Round Minimal Trace   Left 3 2 Round Brisk None         Visual Fields (Counting fingers)       Left Right    Full Full          Extraocular Movement       Right Left    Full, Ortho Full, Ortho         Neuro/Psych     Oriented x3: Yes   Mood/Affect: Normal         Dilation     Both eyes: 1.0% Mydriacyl, 2.5% Phenylephrine @ 10:12 AM           Slit Lamp and Fundus Exam     External Exam       Right Left   External Normal Normal         Slit Lamp Exam       Right Left   Lids/Lashes Dermatochalasis - upper lid, Meibomian gland dysfunction Dermatochalasis - upper lid, Meibomian gland dysfunction   Conjunctiva/Sclera White and quiet White and quiet   Cornea Well healed temporal cataract wound, mild EBMD Well healed temporal cataract wound, mild EBMD   Anterior Chamber Deep and clear Deep and quiet   Iris Round and dilated Round and reactive   Lens PC IOL in good position PC IOL in good position, trace PCO   Anterior Vitreous Vitreous syneresis, no pigment, Posterior vitreous detachment, mild blood stained vitreous condensations Vitreous syneresis, trace pigment, Posterior vitreous detachment, Weiss ring         Fundus Exam       Right Left   Disc Pink and sharp, Tilted disc, +PPA mild Pallor, 360 PPA   C/D Ratio 0.2 0.3   Macula Flat, Blunted foveal reflex, RPE mottling and clumping, No heme or edema Flat, Good foveal reflex, RPE mottling and clumping   Vessels Attenuated, Tortuous Attenuated, Tortuous   Periphery Attached, Peripheral cystoid degeneration inferiorly, focal patch of CR scarring 0130, no RT/RD on 360 scleral depression Pigmented laser changes 0130 and 0800, mild pigment cystoid degeneration inferiorly, no new RT/RD or lattice           IMAGING AND PROCEDURES  Imaging and Procedures for 03/18/2022  OCT, Retina - OU - Both Eyes       Right Eye Quality was good. Central Foveal Thickness: 274. Progression has no prior data. Findings include normal foveal contour, no SRF, myopic contour, intraretinal fluid (Trace cystic changes sup fovea, cystic changes/schisis temp mac  and IN to disc, +  vitreous opacities ).   Left Eye Quality was good. Central Foveal Thickness: 275. Progression has no prior data. Findings include normal foveal contour, no IRF, no SRF, myopic contour.   Notes *Images captured and stored on drive  Diagnosis / Impression:  OD: Trace cystic changes sup fovea, cystic changes/schisis temp mac and IN to disc, +vitreous opacities  OS: NFP; no IRF/SRF   Clinical management:  See below  Abbreviations: NFP - Normal foveal profile. CME - cystoid macular edema. PED - pigment epithelial detachment. IRF - intraretinal fluid. SRF - subretinal fluid. EZ - ellipsoid zone. ERM - epiretinal membrane. ORA - outer retinal atrophy. ORT - outer retinal tubulation. SRHM - subretinal hyper-reflective material. IRHM - intraretinal hyper-reflective material            ASSESSMENT/PLAN:    ICD-10-CM   1. Posterior vitreous detachment of both eyes  H43.813 OCT, Retina - OU - Both Eyes    2. Vitreous hemorrhage of right eye ([place])  H43.11 OCT, Retina - OU - Both Eyes    3. 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 Pseudophakia  Z96.1      1-3. Hemorrhagic PVD OD  - acute symptomatic flashes/red floaters OD, onset yesterday 06.15.23  - OS w/ old PVD w/ retinal tears in 2012 -- s/p laser retinopexy OS by JDM -- good laser in place  - Discussed findings and prognosis  - No RT or RD on 360 scleral depressed exam  - Reviewed s/s of RT/RD  - Strict return precautions for any such RT/RD signs/symptoms  - VH precautions reviewed -- minimize activities, keep head elevated, avoid ASA/NSAIDs/blood thinners as able  - F/U 4 weeks, sooner prn -- DFE/OCT  4. Retinoschisis - myopic retionschisis noted on OCT -- temporal macula and nasal midzone  5. Pseudophakia OU  - s/p CE/IOL OU, Jan 2023 by Dr. Alanda Slim  - IOLs in good position, doing well  - monitor   Ophthalmic Meds Ordered this visit:   No orders of the defined types were placed in this encounter.    Return for Return 4-6 weeks DFE, OCT.  There are no Patient Instructions on file for this visit.   Explained the diagnoses, plan, and follow up with the patient and they expressed understanding.  Patient expressed understanding of the importance of proper follow up care.   This document serves as a record of services personally performed by Gardiner Sleeper, MD, PhD. It was created on their behalf by Leonie Douglas, an ophthalmic technician. The creation of this record is the provider's dictation and/or activities during the visit.    Electronically signed by: Leonie Douglas COA, 03/18/22  11:26 AM  Gardiner Sleeper, M.D., Ph.D. Diseases & Surgery of the Retina and Vitreous [facility]  I have reviewed the above documentation for accuracy and completeness, and I agree with the above. Gardiner Sleeper, M.D., Ph.D. 03/18/22 11:26 AM  Abbreviations: M myopia (nearsighted); A astigmatism; H hyperopia (farsighted); P presbyopia; Mrx spectacle prescription;  CTL contact lenses; OD right eye; OS left eye; OU both eyes  XT exotropia; ET esotropia; PEK punctate epithelial keratitis; PEE punctate epithelial erosions; DES dry eye syndrome; MGD meibomian gland dysfunction; ATs artificial tears; PFAT's preservative free artificial tears; [place] nuclear sclerotic cataract; PSC posterior subcapsular cataract; ERM epi-retinal membrane; PVD posterior vitreous detachment; RD retinal detachment; DM diabetes mellitus; DR  diabetic retinopathy; NPDR non-proliferative diabetic retinopathy; PDR proliferative diabetic retinopathy; CSME clinically significant macular edema; DME diabetic macular edema; dbh dot blot hemorrhages; CWS cotton wool spot; POAG primary open angle glaucoma; C/D cup-to-disc ratio; HVF humphrey visual field; GVF goldmann visual field; OCT optical coherence tomography; IOP intraocular pressure; BRVO Branch retinal vein  occlusion; CRVO central retinal vein occlusion; CRAO central retinal artery occlusion; BRAO branch retinal artery occlusion; RT retinal tear; SB scleral buckle; PPV pars plana vitrectomy; VH Vitreous hemorrhage; PRP panretinal laser photocoagulation; IVK intravitreal kenalog; VMT vitreomacular traction; MH Macular hole;  NVD neovascularization of the disc; NVE neovascularization elsewhere; AREDS age related eye disease study; ARMD age related macular degeneration; POAG primary open angle glaucoma; EBMD epithelial/anterior basement membrane dystrophy; ACIOL anterior chamber intraocular lens; IOL intraocular lens; PCIOL posterior chamber intraocular lens; Phaco/IOL phacoemulsification with intraocular lens placement; [place] photorefractive keratectomy; LASIK laser assisted in situ keratomileusis; HTN hypertension; DM diabetes mellitus; COPD chronic obstructive pulmonary disease

## 2022-04-12 NOTE — Progress Notes (Signed)
[facility] Note  04/15/2022     CHIEF COMPLAINT Patient presents for Retina Follow Up   HISTORY OF PRESENT ILLNESS: Bradley Meyer is a 66 y.o. male who presents to the clinic today for:   HPI     Retina Follow Up   Patient presents with  PVD.  In both eyes.  Severity is moderate.  Duration of 4 weeks.  Since onset it is gradually improving.  I, the attending physician,  performed the HPI with the patient and updated documentation appropriately.        Comments   Pt here for 4 wk ret f/u hemorrhagic PVD OD. Pt states blood in eye has started to clear out, is seeing better.       Last edited by Bernarda Caffey, MD on 04/15/2022  4:27 PM.    Pt states VA has improved, seeing less brown color in OD. Still seeing sporadic peripheral FOL OU.   Referring physician: Colon Branch, MD [address] [place],  [place] [address]  HISTORICAL INFORMATION:   Selected notes from the MEDICAL RECORD NUMBER Referred by: self referral LEE:  Ocular Hx- PMH-    CURRENT MEDICATIONS: Current Outpatient Medications (Ophthalmic Drugs)  Medication Sig   prednisoLONE acetate (PRED FORTE) 1 % ophthalmic suspension Place 1 drop into the right eye 4 (four) times daily for 7 days.   No current facility-administered medications for this visit. (Ophthalmic Drugs)   Current Outpatient Medications (Other)  Medication Sig   Cholecalciferol (VITAMIN D3 PO) Take 2 tablets by mouth daily.   Zinc 25 MG TABS Take 25 mg by mouth daily.   No current facility-administered medications for this visit. (Other)   REVIEW OF SYSTEMS: ROS   Positive for: Eyes Negative for: Constitutional, Gastrointestinal, Neurological, Skin, Genitourinary, Musculoskeletal, HENT, Endocrine, Cardiovascular, Respiratory, Psychiatric, Allergic/Imm, Heme/Lymph Last edited by Kingsley Spittle, COT on 04/15/2022  1:27 PM.     ALLERGIES No Known Allergies  PAST MEDICAL HISTORY Past  Medical History:  Diagnosis Date   BCC (basal cell carcinoma of skin)    nose 2006   Colon polyp    Kidney stones    Palpitation     2010, saw [facility] (w/u per patient negative)     Vitreous detachment 05-2010   Past Surgical History:  Procedure Laterality Date   APPENDECTOMY  1971   CATARACT EXTRACTION Bilateral 10/2021   Dr. Alanda Slim   KNEE ARTHROSCOPY  2002   right   NASAL SEPTUM SURGERY  05/2018   POLYPECTOMY     FAMILY HISTORY Family History  Problem Relation Age of Onset   Prostate cancer Father 41   Bladder Cancer Father    CAD Other        uncle, in his 35s   Colon polyps Mother    Colon cancer Neg Hx    Stomach cancer Neg Hx    Diabetes Neg Hx    Kidney disease Neg Hx    Gallbladder disease Neg Hx    Esophageal cancer Neg Hx    Rectal cancer Neg Hx    SOCIAL HISTORY Social History   Tobacco Use   Smoking status: Former    Packs/day: 0.10    Years: 12.00    Total pack years: 1.20    Types: Cigarettes    Quit date: 07/01/2007    Years since quitting: 14.8   Smokeless tobacco: Never  Vaping Use   Vaping Use:  Never used  Substance Use Topics   Alcohol use: Yes    Comment: Occassionally   Drug use: No       OPHTHALMIC EXAM:  Base Eye Exam     Visual Acuity (Snellen - Linear)       Right Left   Dist cc 20/20 20/25   Dist ph cc  20/20    Correction: Glasses         Tonometry (Tonopen, 1:30 PM)       Right Left   Pressure 12 11         Pupils       Dark Light Shape React APD   Right 3 2 Round Brisk None   Left 3 2 Round Brisk None         Visual Fields (Counting fingers)       Left Right    Full Full         Extraocular Movement       Right Left    Full, Ortho Full, Ortho         Neuro/Psych     Oriented x3: Yes   Mood/Affect: Normal         Dilation     Both eyes: 1.0% Mydriacyl, 2.5% Phenylephrine @ 1:31 PM           Slit Lamp and Fundus Exam     External Exam       Right Left    External Normal Normal         Slit Lamp Exam       Right Left   Lids/Lashes Dermatochalasis - upper lid, Meibomian gland dysfunction Dermatochalasis - upper lid, Meibomian gland dysfunction   Conjunctiva/Sclera White and quiet White and quiet   Cornea Well healed temporal cataract wound, mild EBMD Well healed temporal cataract wound, mild EBMD   Anterior Chamber Deep and clear Deep and quiet   Iris Round and dilated Round and reactive   Lens PC IOL in good position PC IOL in good position, trace PCO   Anterior Vitreous Vitreous syneresis, no pigment, Posterior vitreous detachment, mild blood stained vitreous condensations--improving, settling inferiorly Vitreous syneresis, trace pigment, Posterior vitreous detachment, Weiss ring         Fundus Exam       Right Left   Disc Pink and sharp, Tilted disc, +PPA mild Pallor, 360 PPA   C/D Ratio 0.2 0.3   Macula Flat, Blunted foveal reflex, RPE mottling and clumping, No heme or edema Flat, Good foveal reflex, RPE mottling and clumping   Vessels Attenuated, Tortuous Attenuated, Tortuous   Periphery Attached, Peripheral cystoid degeneration inferiorly, retinal tear at 1200 and 2 retinal tears at 0130 + SRF from 1130-0200 (focal RD); shallow pocket of peripheral SRF from 0930 to 1100. Pigmented laser changes 0130 and 0800, mild pigment cystoid degeneration inferiorly; Mild patch of lattice at 0430 and 0600           IMAGING AND PROCEDURES  Imaging and Procedures for 04/15/2022  OCT, Retina - OU - Both Eyes       Right Eye Quality was good. Central Foveal Thickness: 277. Progression has no prior data. Findings include normal foveal contour, no SRF, myopic contour, intraretinal fluid (Interval improvement vitreous opacities ; Trace cystic changes sup fovea--slightly improved, cystic changes/schisis temp mac and IN to disc).   Left Eye Quality was good. Central Foveal Thickness: 276. Progression has no prior data. Findings include normal  foveal contour, no IRF, no  SRF, myopic contour.   Notes *Images captured and stored on drive  Diagnosis / Impression:  OD: Interval improvement vitreous opacities ; Trace cystic changes sup fovea--slightly improved, cystic changes/schisis temp mac and IN to disc  OS: NFP; no IRF/SRF   Clinical management:  See below  Abbreviations: NFP - Normal foveal profile. CME - cystoid macular edema. PED - pigment epithelial detachment. IRF - intraretinal fluid. SRF - subretinal fluid. EZ - ellipsoid zone. ERM - epiretinal membrane. ORA - outer retinal atrophy. ORT - outer retinal tubulation. SRHM - subretinal hyper-reflective material. IRHM - intraretinal hyper-reflective material      Repair Retinal Detach, Photocoag - OD - Right Eye       LASER PROCEDURE NOTE  Procedure:  Barrier laser retinopexy using slit lamp laser, RIGHT eye   Diagnosis:   Retinal tear w/ surrounding SRF and focal retinal detachment, RIGHT eye                     Flap tears at 1200 and 0130 o'clock anterior to equator                        +SRF / focal RD spanning 1130-0200 and 0930-1100 oclock  Surgeon: Bernarda Caffey, MD, PhD  Anesthesia: Topical, subconjunctival block  Informed consent obtained, operative eye marked, and time out performed prior to initiation of laser.   Laser settings:  Lumenis [id number] laser, slit lamp Lens: [place] PRP 165 Power: 300 mW Spot size: 200 microns Duration: 30 msec  # spots: 978  Placement of laser: Using a [place] PRP 165 contact lens at the slit lamp, laser was placed in three confluent rows around focal RDs and retinal tears spanning 0930-1100 and 1130-0200 oclock anterior to equator.  Complications: None.  Patient tolerated the procedure well and received written and verbal post-procedure care information/education.             ASSESSMENT/PLAN:    ICD-10-CM   1. Posterior vitreous detachment of both eyes  H43.813 OCT, Retina - OU - Both Eyes    2. Vitreous  hemorrhage of right eye ([place])  H43.11     3. Retinal tear of right eye  H33.311 OCT, Retina - OU - Both Eyes    Repair Retinal Detach, Photocoag - OD - Right Eye    CANCELED: Repair Retinal Breaks, Laser - OD - Right Eye    4. Right retinal detachment  H33.21 Repair Retinal Detach, Photocoag - OD - Right Eye    5. History of repair of retinal tear by laser photocoagulation  Z98.890     6. 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 Pseudophakia  Z96.1      1,2. Hemorrhagic PVD OD  - acute symptomatic flashes/red floaters OD, onset 06.15.23 -- pt reports improved floaters but continuous FOL  - OS w/ old PVD w/ retinal tears in 2012 -- s/p laser retinopexy OS by JDM -- good laser in place  - New RTs and RDs OD noted on f/u today -- see below  - VH improved but significant old condensations settled inferiorly  3,4. Retinal tears w/ +SRF/focal peripheral retinal detachment, OD.   - The incidence, risk factors, and natural history of retinal tear was discussed with patient.   - Potential treatment options including laser retinopexy and cryotherapy discussed with patient. - horseshoe tears located 0130 (2) and 1200 - focal peripheral RDs from 1130-0200 and 0930-1100 - recommend laser retinopexy OD  today, 04/15/22 - pt wishes to proceed with laser - RBA of procedure discussed, questions answered - informed consent obtained and signed - see procedure note - start PF QID x7 days - f/u in 2 wks for DFE, OCT  5. History of RT OS  - history of PVD w/ retinal tears in 2012 - s/p laser retinopexy OS by JDM -- good laser in place  - no new RT/RD  - monitor  6.. Retinoschisis - myopic retionschisis noted on OCT -- temporal macula and nasal midzone  7. Pseudophakia OU  - s/p CE/IOL OU, Jan 2023 by Dr. Alanda Slim  - IOLs in good position, doing well  - monitor   Ophthalmic Meds Ordered this visit:  Meds ordered this encounter  Medications   prednisoLONE acetate (PRED FORTE) 1 % ophthalmic  suspension    Sig: Place 1 drop into the right eye 4 (four) times daily for 7 days.    Dispense:  10 mL    Refill:  0     Return in about 4 days (around 04/19/2022) for f/u ret tear OD- s/p retinopexy 04/15/22, DFE, OCT.  There are no Patient Instructions on file for this visit.   Explained the diagnoses, plan, and follow up with the patient and they expressed understanding.  Patient expressed understanding of the importance of proper follow up care.   This document serves as a record of services personally performed by Gardiner Sleeper, MD, PhD. It was created on their behalf by Leonie Douglas, an ophthalmic technician. The creation of this record is the provider's dictation and/or activities during the visit.    Electronically signed by: Leonie Douglas COA, 04/15/22  4:45 PM  Gardiner Sleeper, M.D., Ph.D. Diseases & Surgery of the Retina and Vitreous [facility]  I have reviewed the above documentation for accuracy and completeness, and I agree with the above. Gardiner Sleeper, M.D., Ph.D. 04/15/22 4:52 PM   Abbreviations: M myopia (nearsighted); A astigmatism; H hyperopia (farsighted); P presbyopia; Mrx spectacle prescription;  CTL contact lenses; OD right eye; OS left eye; OU both eyes  XT exotropia; ET esotropia; PEK punctate epithelial keratitis; PEE punctate epithelial erosions; DES dry eye syndrome; MGD meibomian gland dysfunction; ATs artificial tears; PFAT's preservative free artificial tears; [place] nuclear sclerotic cataract; PSC posterior subcapsular cataract; ERM epi-retinal membrane; PVD posterior vitreous detachment; RD retinal detachment; DM diabetes mellitus; DR diabetic retinopathy; NPDR non-proliferative diabetic retinopathy; PDR proliferative diabetic retinopathy; CSME clinically significant macular edema; DME diabetic macular edema; dbh dot blot hemorrhages; CWS cotton wool spot; POAG primary open angle glaucoma; C/D cup-to-disc ratio; HVF humphrey visual  field; GVF goldmann visual field; OCT optical coherence tomography; IOP intraocular pressure; BRVO Branch retinal vein occlusion; CRVO central retinal vein occlusion; CRAO central retinal artery occlusion; BRAO branch retinal artery occlusion; RT retinal tear; SB scleral buckle; PPV pars plana vitrectomy; VH Vitreous hemorrhage; PRP panretinal laser photocoagulation; IVK intravitreal kenalog; VMT vitreomacular traction; MH Macular hole;  NVD neovascularization of the disc; NVE neovascularization elsewhere; AREDS age related eye disease study; ARMD age related macular degeneration; POAG primary open angle glaucoma; EBMD epithelial/anterior basement membrane dystrophy; ACIOL anterior chamber intraocular lens; IOL intraocular lens; PCIOL posterior chamber intraocular lens; Phaco/IOL phacoemulsification with intraocular lens placement; [place] photorefractive keratectomy; LASIK laser assisted in situ keratomileusis; HTN hypertension; DM diabetes mellitus; COPD chronic obstructive pulmonary disease

## 2022-04-15 ENCOUNTER — Ambulatory Visit (INDEPENDENT_AMBULATORY_CARE_PROVIDER_SITE_OTHER): Payer: 59 | Admitting: Ophthalmology

## 2022-04-15 ENCOUNTER — Encounter (INDEPENDENT_AMBULATORY_CARE_PROVIDER_SITE_OTHER): Payer: Self-pay | Admitting: Ophthalmology

## 2022-04-15 DIAGNOSIS — Z9889 Other specified postprocedural states: Secondary | ICD-10-CM

## 2022-04-15 DIAGNOSIS — H33101 Unspecified retinoschisis, right eye: Secondary | ICD-10-CM

## 2022-04-15 DIAGNOSIS — H3321 Serous retinal detachment, right eye: Secondary | ICD-10-CM | POA: Diagnosis not present

## 2022-04-15 DIAGNOSIS — H4311 Vitreous hemorrhage, right eye: Secondary | ICD-10-CM | POA: Diagnosis not present

## 2022-04-15 DIAGNOSIS — Z961 Presence of intraocular lens: Secondary | ICD-10-CM

## 2022-04-15 DIAGNOSIS — H33311 Horseshoe tear of retina without detachment, right eye: Secondary | ICD-10-CM | POA: Diagnosis not present

## 2022-04-15 DIAGNOSIS — H43813 Vitreous degeneration, bilateral: Secondary | ICD-10-CM | POA: Diagnosis not present

## 2022-04-15 MED ORDER — PREDNISOLONE ACETATE 1 % OP SUSP: 1.0000 [drp] | Freq: Four times a day (QID) | OPHTHALMIC | 0 refills | Status: AC

## 2022-04-19 ENCOUNTER — Encounter (INDEPENDENT_AMBULATORY_CARE_PROVIDER_SITE_OTHER): Payer: Self-pay | Admitting: Ophthalmology

## 2022-04-19 ENCOUNTER — Ambulatory Visit (INDEPENDENT_AMBULATORY_CARE_PROVIDER_SITE_OTHER): Payer: 59 | Admitting: Ophthalmology

## 2022-04-19 DIAGNOSIS — H3321 Serous retinal detachment, right eye: Secondary | ICD-10-CM

## 2022-04-19 DIAGNOSIS — H33311 Horseshoe tear of retina without detachment, right eye: Secondary | ICD-10-CM

## 2022-04-19 DIAGNOSIS — Z9889 Other specified postprocedural states: Secondary | ICD-10-CM

## 2022-04-19 DIAGNOSIS — H4311 Vitreous hemorrhage, right eye: Secondary | ICD-10-CM

## 2022-04-19 DIAGNOSIS — H43813 Vitreous degeneration, bilateral: Secondary | ICD-10-CM

## 2022-04-19 DIAGNOSIS — Z961 Presence of intraocular lens: Secondary | ICD-10-CM

## 2022-04-19 DIAGNOSIS — H33101 Unspecified retinoschisis, right eye: Secondary | ICD-10-CM

## 2022-04-19 NOTE — Progress Notes (Signed)
[facility] Note  04/19/2022     CHIEF COMPLAINT Patient presents for Retina Follow Up   HISTORY OF PRESENT ILLNESS: Bradley Meyer is a 66 y.o. male who presents to the clinic today for:   HPI     Retina Follow Up   Patient presents with  PVD.  In right eye.  Severity is moderate.  Duration of 4 days.  Since onset it is stable.  I, the attending physician,  performed the HPI with the patient and updated documentation appropriately.        Comments   Patient states vision cloudy OD. Still sees clouds floating OD. Some flashing OS, especially in am, but these symptoms are not new. Using Pred Forte qid OD.      Last edited by Bernarda Caffey, MD on 04/22/2022 12:41 AM.     Patient states after the laser the vision seemed cloudy and stirred up floaters. The vision is improving  Referring physician: Colon Branch, MD [address] [place],  [place] [address]  HISTORICAL INFORMATION:   Selected notes from the MEDICAL RECORD NUMBER Referred by: self referral LEE:  Ocular Hx- PMH-    CURRENT MEDICATIONS: Current Outpatient Medications (Ophthalmic Drugs)  Medication Sig   prednisoLONE acetate (PRED FORTE) 1 % ophthalmic suspension Place 1 drop into the right eye 4 (four) times daily for 7 days.   No current facility-administered medications for this visit. (Ophthalmic Drugs)   Current Outpatient Medications (Other)  Medication Sig   Cholecalciferol (VITAMIN D3 PO) Take 2 tablets by mouth daily.   Zinc 25 MG TABS Take 25 mg by mouth daily.   No current facility-administered medications for this visit. (Other)   REVIEW OF SYSTEMS: ROS   Positive for: Eyes Negative for: Constitutional, Gastrointestinal, Neurological, Skin, Genitourinary, Musculoskeletal, HENT, Endocrine, Cardiovascular, Respiratory, Psychiatric, Allergic/Imm, Heme/Lymph Last edited by Roselee Nova D, COT on 04/19/2022  1:01 PM.     ALLERGIES No Known  Allergies  PAST MEDICAL HISTORY Past Medical History:  Diagnosis Date   BCC (basal cell carcinoma of skin)    nose 2006   Colon polyp    Kidney stones    Palpitation     2010, saw [facility] Cardiology (w/u per patient negative)     Vitreous detachment 05-2010   Past Surgical History:  Procedure Laterality Date   APPENDECTOMY  1971   CATARACT EXTRACTION Bilateral 10/2021   Dr. Alanda Slim   KNEE ARTHROSCOPY  2002   right   NASAL SEPTUM SURGERY  05/2018   POLYPECTOMY     FAMILY HISTORY Family History  Problem Relation Age of Onset   Prostate cancer Father 34   Bladder Cancer Father    CAD Other        uncle, in his 51s   Colon polyps Mother    Colon cancer Neg Hx    Stomach cancer Neg Hx    Diabetes Neg Hx    Kidney disease Neg Hx    Gallbladder disease Neg Hx    Esophageal cancer Neg Hx    Rectal cancer Neg Hx    SOCIAL HISTORY Social History   Tobacco Use   Smoking status: Former    Packs/day: 0.10    Years: 12.00    Total pack years: 1.20    Types: Cigarettes    Quit date: 07/01/2007    Years since quitting: 14.8   Smokeless tobacco: Never  Vaping Use   Vaping  Use: Never used  Substance Use Topics   Alcohol use: Yes    Comment: Occassionally   Drug use: No       OPHTHALMIC EXAM:  Base Eye Exam     Visual Acuity (Snellen - Linear)       Right Left   Dist cc 20/20 -1 20/20 -1         Tonometry (Tonopen, 1:03 PM)       Right Left   Pressure 16 17         Pupils       Dark Light Shape React APD   Right 3 2 Round Brisk None   Left 3 2 Round Brisk None         Visual Fields (Counting fingers)       Left Right    Full Full         Extraocular Movement       Right Left    Full, Ortho Full, Ortho         Neuro/Psych     Oriented x3: Yes   Mood/Affect: Normal         Dilation     Both eyes: 1.0% Mydriacyl, 2.5% Phenylephrine @ 1:03 PM           Slit Lamp and Fundus Exam     External Exam       Right Left    External Normal Normal         Slit Lamp Exam       Right Left   Lids/Lashes Dermatochalasis - upper lid, Meibomian gland dysfunction Dermatochalasis - upper lid, Meibomian gland dysfunction   Conjunctiva/Sclera White and quiet White and quiet   Cornea Well healed temporal cataract wound, mild EBMD Well healed temporal cataract wound, mild EBMD   Anterior Chamber Deep and clear Deep and quiet   Iris Round and dilated Round and reactive   Lens PC IOL in good position PC IOL in good position, trace PCO   Anterior Vitreous Vitreous syneresis, fine pigment, Posterior vitreous detachment, mild blood stained vitreous condensations--improving, settling inferiorly Vitreous syneresis, trace pigment, Posterior vitreous detachment, Weiss ring         Fundus Exam       Right Left   Disc Pink and sharp, Tilted disc, +PPA mild Pallor, 360 PPA   C/D Ratio 0.2 0.3   Macula Flat, Blunted foveal reflex, RPE mottling and clumping, No heme or edema Flat, Good foveal reflex, RPE mottling and clumping   Vessels Attenuated, Tortuous Attenuated, Tortuous   Periphery Peripheral cystoid degeneration inferiorly, retinal tear at 1200 and 2 retinal tears at 0130 + SRF from 1130-0200 (focal RD); shallow pocket of peripheral SRF from 0930 to 1100. Good early laser changes surrounding focal RD's, inferior periphery observed by settling BH. Small RT @ 10:30 ora Pigmented laser changes 0130 and 0800, mild pigment cystoid degeneration inferiorly; Mild patch of lattice at 0430 and 0600           IMAGING AND PROCEDURES  Imaging and Procedures for 04/19/2022  OCT, Retina - OU - Both Eyes       Right Eye Quality was good. Central Foveal Thickness: 277. Progression has been stable. Findings include normal foveal contour, myopic contour, intraretinal fluid, subretinal fluid (Mild persistent vitreous opacities; Trace cystic changes sup fovea--slightly improved, pockets of periphery SRF- ST/SN periphery caught on wide  field).   Left Eye Quality was good. Central Foveal Thickness: 276. Progression has been stable. Findings  include normal foveal contour, no IRF, no SRF, myopic contour.   Notes *Images captured and stored on drive  Diagnosis / Impression:  OD: Mild persistent vitreous opacities; Trace cystic changes sup fovea--slightly improved, pockets of periphery SRF- ST/SN periphery caught on wide field OS: NFP; no IRF/SRF   Clinical management:  See below  Abbreviations: NFP - Normal foveal profile. CME - cystoid macular edema. PED - pigment epithelial detachment. IRF - intraretinal fluid. SRF - subretinal fluid. EZ - ellipsoid zone. ERM - epiretinal membrane. ORA - outer retinal atrophy. ORT - outer retinal tubulation. SRHM - subretinal hyper-reflective material. IRHM - intraretinal hyper-reflective material            ASSESSMENT/PLAN:    ICD-10-CM   1. Posterior vitreous detachment of both eyes  H43.813 OCT, Retina - OU - Both Eyes    2. Retinal tear of right eye  H33.311     3. Vitreous hemorrhage of right eye ([place])  H43.11     4. Right retinal detachment  H33.21     5. History of repair of retinal tear by laser photocoagulation  Z98.890     6. 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 Pseudophakia  Z96.1       1,2. Hemorrhagic PVD OD  - acute symptomatic flashes/red floaters OD, onset 06.15.23 -- pt reports improved floaters but continuous FOL  - OS w/ old PVD w/ retinal tears in 2012 -- s/p laser retinopexy OS by JDM -- good laser in place  - VH improved but significant old condensations settled inferiorly  3,4. Retinal tears w/ +SRF/focal peripheral retinal detachment, OD.   - horseshoe tears located 0130 (2) and 1200 - focal peripheral RDs from 1130-0200 and 0930-1100 - s/p laser retinopexy OD 04/15/22 - good early laser changes in place, surrounding RDs - f/u in 1 week DFE, OCT   5. History of RT OS  - history of PVD w/ retinal tears in 2012 - s/p laser retinopexy OS by  JDM -- good laser in place  - no new RT/RD  - continue to monitor  6.. Retinoschisis - myopic retionschisis noted on OCT -- temporal macula and nasal midzone  7. Pseudophakia OU  - s/p CE/IOL OU, Jan 2023 by Dr. Alanda Slim  - IOLs in good position, doing well  - monitor   Ophthalmic Meds Ordered this visit:  No orders of the defined types were placed in this encounter.    Return in about 1 week (around 04/26/2022) for DFE, OCT.  There are no Patient Instructions on file for this visit.   Explained the diagnoses, plan, and follow up with the patient and they expressed understanding.  Patient expressed understanding of the importance of proper follow up care.   This document serves as a record of services personally performed by Gardiner Sleeper, MD, PhD. It was created on their behalf by Renaldo Reel, [place] an ophthalmic technician. The creation of this record is the provider's dictation and/or activities during the visit.    Electronically signed by:  Renaldo Reel, COT  04/19/22 12:43 AM  Gardiner Sleeper, M.D., Ph.D. Diseases & Surgery of the Retina and Vitreous [facility]  I have reviewed the above documentation for accuracy and completeness, and I agree with the above. Gardiner Sleeper, M.D., Ph.D. 04/22/22 12:46 AM  Abbreviations: M myopia (nearsighted); A astigmatism; H hyperopia (farsighted); P presbyopia; Mrx spectacle prescription;  CTL contact lenses; OD right eye; OS left eye;  OU both eyes  XT exotropia; ET esotropia; PEK punctate epithelial keratitis; PEE punctate epithelial erosions; DES dry eye syndrome; MGD meibomian gland dysfunction; ATs artificial tears; PFAT's preservative free artificial tears; [place] nuclear sclerotic cataract; PSC posterior subcapsular cataract; ERM epi-retinal membrane; PVD posterior vitreous detachment; RD retinal detachment; DM diabetes mellitus; DR diabetic retinopathy; NPDR non-proliferative diabetic retinopathy; PDR  proliferative diabetic retinopathy; CSME clinically significant macular edema; DME diabetic macular edema; dbh dot blot hemorrhages; CWS cotton wool spot; POAG primary open angle glaucoma; C/D cup-to-disc ratio; HVF humphrey visual field; GVF goldmann visual field; OCT optical coherence tomography; IOP intraocular pressure; BRVO Branch retinal vein occlusion; CRVO central retinal vein occlusion; CRAO central retinal artery occlusion; BRAO branch retinal artery occlusion; RT retinal tear; SB scleral buckle; PPV pars plana vitrectomy; VH Vitreous hemorrhage; PRP panretinal laser photocoagulation; IVK intravitreal kenalog; VMT vitreomacular traction; MH Macular hole;  NVD neovascularization of the disc; NVE neovascularization elsewhere; AREDS age related eye disease study; ARMD age related macular degeneration; POAG primary open angle glaucoma; EBMD epithelial/anterior basement membrane dystrophy; ACIOL anterior chamber intraocular lens; IOL intraocular lens; PCIOL posterior chamber intraocular lens; Phaco/IOL phacoemulsification with intraocular lens placement; [place] photorefractive keratectomy; LASIK laser assisted in situ keratomileusis; HTN hypertension; DM diabetes mellitus; COPD chronic obstructive pulmonary disease

## 2022-04-21 NOTE — Progress Notes (Signed)
[facility] Note  04/27/2022     CHIEF COMPLAINT Patient presents for Retina Follow Up   HISTORY OF PRESENT ILLNESS: Bradley Meyer is a 66 y.o. male who presents to the clinic today for:   HPI     Retina Follow Up   Patient presents with  PVD.  In both eyes.  Severity is moderate.  Duration of 2 weeks.  Since onset it is gradually improving.  I, the attending physician,  performed the HPI with the patient and updated documentation appropriately.        Comments   Pt here for 2 wk f/u for focal RD OD/VH OD. Pt states VA has cleared up a bit, a lot less brownish tint in [place]. S/p retinopexy 04/15/22      Last edited by Bernarda Caffey, MD on 04/28/2022 10:12 AM.    Patient states that he did not go out of town for the funeral. He states the floaters have settled down but still sees some jelly like stuff.   Referring physician: Colon Branch, MD [address] [place],  [place] [address]  HISTORICAL INFORMATION:   Selected notes from the MEDICAL RECORD NUMBER Referred by: self referral LEE:  Ocular Hx- PMH-    CURRENT MEDICATIONS: No current outpatient medications on file. (Ophthalmic Drugs)   No current facility-administered medications for this visit. (Ophthalmic Drugs)   Current Outpatient Medications (Other)  Medication Sig   Cholecalciferol (VITAMIN D3 PO) Take 2 tablets by mouth daily.   Zinc 25 MG TABS Take 25 mg by mouth daily.   No current facility-administered medications for this visit. (Other)   REVIEW OF SYSTEMS: ROS   Positive for: Eyes Negative for: Constitutional, Gastrointestinal, Neurological, Skin, Genitourinary, Musculoskeletal, HENT, Endocrine, Cardiovascular, Respiratory, Psychiatric, Allergic/Imm, Heme/Lymph Last edited by Kingsley Spittle, COT on 04/27/2022  1:24 PM.      ALLERGIES No Known Allergies  PAST MEDICAL HISTORY Past Medical History:  Diagnosis Date   BCC (basal cell carcinoma of  skin)    nose 2006   Colon polyp    Kidney stones    Palpitation     2010, saw [facility] (w/u per patient negative)     Vitreous detachment 05-2010   Past Surgical History:  Procedure Laterality Date   APPENDECTOMY  1971   CATARACT EXTRACTION Bilateral 10/2021   Dr. Alanda Slim   KNEE ARTHROSCOPY  2002   right   NASAL SEPTUM SURGERY  05/2018   POLYPECTOMY     FAMILY HISTORY Family History  Problem Relation Age of Onset   Prostate cancer Father 61   Bladder Cancer Father    CAD Other        uncle, in his 49s   Colon polyps Mother    Colon cancer Neg Hx    Stomach cancer Neg Hx    Diabetes Neg Hx    Kidney disease Neg Hx    Gallbladder disease Neg Hx    Esophageal cancer Neg Hx    Rectal cancer Neg Hx    SOCIAL HISTORY Social History   Tobacco Use   Smoking status: Former    Packs/day: 0.10    Years: 12.00    Total pack years: 1.20    Types: Cigarettes    Quit date: 07/01/2007    Years since quitting: 14.8   Smokeless tobacco: Never  Vaping Use   Vaping Use: Never used  Substance Use Topics   Alcohol  use: Yes    Comment: Occassionally   Drug use: No       OPHTHALMIC EXAM:  Base Eye Exam     Visual Acuity (Snellen - Linear)       Right Left   Dist cc 20/20 20/20 -2    Correction: Glasses         Tonometry (Tonopen, 1:27 PM)       Right Left   Pressure 19 15         Pupils       Dark Light Shape React APD   Right 3 2 Round Brisk None   Left 3 2 Round Brisk None         Visual Fields (Counting fingers)       Left Right    Full Full         Extraocular Movement       Right Left    Full, Ortho Full, Ortho         Neuro/Psych     Oriented x3: Yes   Mood/Affect: Normal         Dilation     Both eyes: 1.0% Mydriacyl, 2.5% Phenylephrine @ 1:28 PM           Slit Lamp and Fundus Exam     External Exam       Right Left   External Normal Normal         Slit Lamp Exam       Right Left   Lids/Lashes  Dermatochalasis - upper lid, Meibomian gland dysfunction Dermatochalasis - upper lid, Meibomian gland dysfunction   Conjunctiva/Sclera White and quiet White and quiet   Cornea Well healed temporal cataract wound, mild EBMD Well healed temporal cataract wound, mild EBMD   Anterior Chamber Deep and clear Deep and quiet   Iris Round and dilated Round and reactive   Lens PC IOL in good position PC IOL in good position, trace PCO   Anterior Vitreous Vitreous syneresis, fine pigment, Posterior vitreous detachment, mild blood stained vitreous condensations--improving, settling inferiorly Vitreous syneresis, trace pigment, Posterior vitreous detachment, Weiss ring         Fundus Exam       Right Left   Disc Pink and sharp, Tilted disc, +PPA mild Pallor, 360 PPA   C/D Ratio 0.2 0.3   Macula Flat, Blunted foveal reflex, RPE mottling and clumping, No heme or edema Flat, Good foveal reflex, RPE mottling and clumping   Vessels Attenuated, Tortuous Attenuated, Tortuous   Periphery Peripheral cystoid degeneration inferiorly, retinal tear at 1200 and 2 retinal tears at 0130 + SRF from 1130-0200 (focal RD); shallow pocket of peripheral SRF from 0930 to 1100. Good laser barricade surrounding focal RD's, inferior periphery obscured by settling VH. Small RT @ 10:30 ora Pigmented laser changes 0130 and 0800, mild pigment cystoid degeneration inferiorly; Mild patch of lattice at 0430 and 0600           IMAGING AND PROCEDURES  Imaging and Procedures for 04/27/2022  OCT, Retina - OU - Both Eyes       Right Eye Quality was good. Central Foveal Thickness: 278. Progression has been stable. Findings include normal foveal contour, myopic contour, intraretinal fluid, subretinal fluid (Interval improvement persistent vitreous opacities; Trace cystic changes sup fovea--slightly improved, pockets of periphery SRF- ST/SN periphery caught on wide field).   Left Eye Quality was good. Central Foveal Thickness: 274.  Progression has been stable. Findings include normal foveal contour, no IRF,  no SRF, myopic contour.   Notes *Images captured and stored on drive  Diagnosis / Impression:  OD: Mild persistent vitreous opacities; Trace cystic changes sup fovea--slightly improved, pockets of peripheral SRF- ST/SN quads caught on wide field OS: NFP; no IRF/SRF   Clinical management:  See below  Abbreviations: NFP - Normal foveal profile. CME - cystoid macular edema. PED - pigment epithelial detachment. IRF - intraretinal fluid. SRF - subretinal fluid. EZ - ellipsoid zone. ERM - epiretinal membrane. ORA - outer retinal atrophy. ORT - outer retinal tubulation. SRHM - subretinal hyper-reflective material. IRHM - intraretinal hyper-reflective material     \       ASSESSMENT/PLAN:    ICD-10-CM   1. Posterior vitreous detachment of both eyes  H43.813 OCT, Retina - OU - Both Eyes    2. Retinal tear of right eye  H33.311     3. Vitreous hemorrhage of right eye ([place])  H43.11 OCT, Retina - OU - Both Eyes    4. Right retinal detachment  H33.21 OCT, Retina - OU - Both Eyes    5. History of repair of retinal tear by laser photocoagulation  Z98.890     6. 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 Pseudophakia  Z96.1       1,2. Hemorrhagic PVD OD  - acute symptomatic flashes/red floaters OD, onset 06.15.23 -- pt reports improved floaters and FOL  - OS w/ old PVD w/ retinal tears in 2012 -- s/p laser retinopexy OS by JDM -- good laser in place  - VH improved but significant old condensations settled inferiorly  3,4. Retinal tears w/ +SRF/focal peripheral retinal detachment, OD.   - horseshoe tears located 0130 (2), 1200 and 1030 - focal peripheral RDs from 1130-0200 and 0930-1100 - OCT OD Interval improvement in vitreous opacities; Trace cystic changes sup fovea--slightly improved, pockets of peripheral SRF- ST/SN quads caught on wide field - Light activity has been cleared, patient was advised to hold on  strenuous and vigorous activity until further notice.  - BCVA 20/20 OD -- stable  - f/u in 1 wks for DFE, OCT  5. History of RT OS  - history of PVD w/ retinal tears in 2012 - s/p laser retinopexy OS by JDM -- good laser in place  - no new RT/RD  - continue to monitor  6.. Retinoschisis - myopic retionschisis noted on OCT -- temporal macula and nasal midzone  7. Pseudophakia OU  - s/p CE/IOL OU, Jan 2023 by Dr. Alanda Slim  - IOLs in good position, doing well  - continue to monitor   Ophthalmic Meds Ordered this visit:  No orders of the defined types were placed in this encounter.    Return in about 1 week (around 05/04/2022) for RT/RD OD , DFE, OCT.  There are no Patient Instructions on file for this visit.   Explained the diagnoses, plan, and follow up with the patient and they expressed understanding.  Patient expressed understanding of the importance of proper follow up care.   This document serves as a record of services personally performed by Gardiner Sleeper, MD, PhD. It was created on their behalf by Orvan Falconer, an ophthalmic technician. The creation of this record is the provider's dictation and/or activities during the visit.    Electronically signed by: Orvan Falconer, OA, 04/28/22  10:16 AM  This document serves as a record of services personally performed by Gardiner Sleeper, MD, PhD. It was created on their behalf by Altha Harm  [place], [place] an ophthalmic technician. The creation of this record is the provider's dictation and/or activities during the visit.    Electronically signed by:  Renaldo Reel, COT  04/27/22 10:16 AM  Gardiner Sleeper, M.D., Ph.D. Diseases & Surgery of the Retina and Vitreous [facility]  I have reviewed the above documentation for accuracy and completeness, and I agree with the above. Gardiner Sleeper, M.D., Ph.D. 04/28/22 10:19 AM   Abbreviations: M myopia (nearsighted); A astigmatism; H hyperopia  (farsighted); P presbyopia; Mrx spectacle prescription;  CTL contact lenses; OD right eye; OS left eye; OU both eyes  XT exotropia; ET esotropia; PEK punctate epithelial keratitis; PEE punctate epithelial erosions; DES dry eye syndrome; MGD meibomian gland dysfunction; ATs artificial tears; PFAT's preservative free artificial tears; [place] nuclear sclerotic cataract; PSC posterior subcapsular cataract; ERM epi-retinal membrane; PVD posterior vitreous detachment; RD retinal detachment; DM diabetes mellitus; DR diabetic retinopathy; NPDR non-proliferative diabetic retinopathy; PDR proliferative diabetic retinopathy; CSME clinically significant macular edema; DME diabetic macular edema; dbh dot blot hemorrhages; CWS cotton wool spot; POAG primary open angle glaucoma; C/D cup-to-disc ratio; HVF humphrey visual field; GVF goldmann visual field; OCT optical coherence tomography; IOP intraocular pressure; BRVO Branch retinal vein occlusion; CRVO central retinal vein occlusion; CRAO central retinal artery occlusion; BRAO branch retinal artery occlusion; RT retinal tear; SB scleral buckle; PPV pars plana vitrectomy; VH Vitreous hemorrhage; PRP panretinal laser photocoagulation; IVK intravitreal kenalog; VMT vitreomacular traction; MH Macular hole;  NVD neovascularization of the disc; NVE neovascularization elsewhere; AREDS age related eye disease study; ARMD age related macular degeneration; POAG primary open angle glaucoma; EBMD epithelial/anterior basement membrane dystrophy; ACIOL anterior chamber intraocular lens; IOL intraocular lens; PCIOL posterior chamber intraocular lens; Phaco/IOL phacoemulsification with intraocular lens placement; [place] photorefractive keratectomy; LASIK laser assisted in situ keratomileusis; HTN hypertension; DM diabetes mellitus; COPD chronic obstructive pulmonary disease

## 2022-04-22 ENCOUNTER — Encounter (INDEPENDENT_AMBULATORY_CARE_PROVIDER_SITE_OTHER): Payer: Self-pay | Admitting: Ophthalmology

## 2022-04-27 ENCOUNTER — Encounter (INDEPENDENT_AMBULATORY_CARE_PROVIDER_SITE_OTHER): Payer: Self-pay | Admitting: Ophthalmology

## 2022-04-27 ENCOUNTER — Ambulatory Visit (INDEPENDENT_AMBULATORY_CARE_PROVIDER_SITE_OTHER): Payer: 59 | Admitting: Ophthalmology

## 2022-04-27 DIAGNOSIS — H43813 Vitreous degeneration, bilateral: Secondary | ICD-10-CM

## 2022-04-27 DIAGNOSIS — Z961 Presence of intraocular lens: Secondary | ICD-10-CM

## 2022-04-27 DIAGNOSIS — H33311 Horseshoe tear of retina without detachment, right eye: Secondary | ICD-10-CM | POA: Diagnosis not present

## 2022-04-27 DIAGNOSIS — H4311 Vitreous hemorrhage, right eye: Secondary | ICD-10-CM

## 2022-04-27 DIAGNOSIS — H33101 Unspecified retinoschisis, right eye: Secondary | ICD-10-CM

## 2022-04-27 DIAGNOSIS — H3321 Serous retinal detachment, right eye: Secondary | ICD-10-CM | POA: Diagnosis not present

## 2022-04-27 DIAGNOSIS — Z9889 Other specified postprocedural states: Secondary | ICD-10-CM

## 2022-04-28 ENCOUNTER — Encounter (INDEPENDENT_AMBULATORY_CARE_PROVIDER_SITE_OTHER): Payer: Self-pay | Admitting: Ophthalmology

## 2022-05-02 NOTE — Progress Notes (Signed)
[facility] Note  05/04/2022     CHIEF COMPLAINT Patient presents for Retina Follow Up   HISTORY OF PRESENT ILLNESS: Bradley Meyer is a 66 y.o. male who presents to the clinic today for:   HPI     Retina Follow Up   Patient presents with  Retinal Break/Detachment.  In right eye.  This started 1 week ago.  I, the attending physician,  performed the HPI with the patient and updated documentation appropriately.        Comments   Patient here for 1 week retina follow up for RD OD. Patient states vision unchanged from last week. No eye pain.       Last edited by Bernarda Caffey, MD on 05/04/2022  1:56 PM.    Pt states vision is the same as last week, some of the smaller floaters have gotten better  Referring physician: Colon Branch, MD [address] [place],  [place] [address]  HISTORICAL INFORMATION:   Selected notes from the MEDICAL RECORD NUMBER Referred by: self referral LEE:  Ocular Hx- PMH-    CURRENT MEDICATIONS: No current outpatient medications on file. (Ophthalmic Drugs)   No current facility-administered medications for this visit. (Ophthalmic Drugs)   Current Outpatient Medications (Other)  Medication Sig   Cholecalciferol (VITAMIN D3 PO) Take 2 tablets by mouth daily.   Zinc 25 MG TABS Take 25 mg by mouth daily.   No current facility-administered medications for this visit. (Other)   REVIEW OF SYSTEMS: ROS   Positive for: Eyes Negative for: Constitutional, Gastrointestinal, Neurological, Skin, Genitourinary, Musculoskeletal, HENT, Endocrine, Cardiovascular, Respiratory, Psychiatric, Allergic/Imm, Heme/Lymph Last edited by Theodore Demark, COA on 05/04/2022  1:20 PM.     ALLERGIES No Known Allergies  PAST MEDICAL HISTORY Past Medical History:  Diagnosis Date   BCC (basal cell carcinoma of skin)    nose 2006   Colon polyp    Kidney stones    Palpitation     2010, saw [facility] (w/u per patient  negative)     Vitreous detachment 05-2010   Past Surgical History:  Procedure Laterality Date   APPENDECTOMY  1971   CATARACT EXTRACTION Bilateral 10/2021   Dr. Alanda Slim   KNEE ARTHROSCOPY  2002   right   NASAL SEPTUM SURGERY  05/2018   POLYPECTOMY     FAMILY HISTORY Family History  Problem Relation Age of Onset   Prostate cancer Father 58   Bladder Cancer Father    CAD Other        uncle, in his 61s   Colon polyps Mother    Colon cancer Neg Hx    Stomach cancer Neg Hx    Diabetes Neg Hx    Kidney disease Neg Hx    Gallbladder disease Neg Hx    Esophageal cancer Neg Hx    Rectal cancer Neg Hx    SOCIAL HISTORY Social History   Tobacco Use   Smoking status: Former    Packs/day: 0.10    Years: 12.00    Total pack years: 1.20    Types: Cigarettes    Quit date: 07/01/2007    Years since quitting: 14.8   Smokeless tobacco: Never  Vaping Use   Vaping Use: Never used  Substance Use Topics   Alcohol use: Yes    Comment: Occassionally   Drug use: No       OPHTHALMIC EXAM:  Base Eye Exam  Visual Acuity (Snellen - Linear)       Right Left   Dist cc 20/20 20/20    Correction: Glasses         Tonometry (Tonopen, 1:19 PM)       Right Left   Pressure 18 18         Pupils       Dark Light Shape React APD   Right 3 2 Round Brisk None   Left 3 2 Round Brisk None         Visual Fields (Counting fingers)       Left Right    Full Full         Extraocular Movement       Right Left    Full, Ortho Full, Ortho         Neuro/Psych     Oriented x3: Yes   Mood/Affect: Normal         Dilation     Both eyes:            Slit Lamp and Fundus Exam     External Exam       Right Left   External Normal Normal         Slit Lamp Exam       Right Left   Lids/Lashes Dermatochalasis - upper lid, Meibomian gland dysfunction Dermatochalasis - upper lid, Meibomian gland dysfunction   Conjunctiva/Sclera White and quiet White and quiet    Cornea Well healed temporal cataract wound, mild EBMD Well healed temporal cataract wound, mild EBMD   Anterior Chamber Deep and clear Deep and quiet   Iris Round and dilated Round and reactive   Lens PC IOL in good position PC IOL in good position, trace PCO   Anterior Vitreous Vitreous syneresis, fine pigment, Posterior vitreous detachment, VH improved Vitreous syneresis, trace pigment, Posterior vitreous detachment, Weiss ring         Fundus Exam       Right Left   Disc Pink and sharp, Tilted disc, +PPA mild Pallor, 360 PPA   C/D Ratio 0.2 0.3   Macula Flat, Blunted foveal reflex, RPE mottling and clumping, No heme or edema Flat, Good foveal reflex, RPE mottling and clumping   Vessels Attenuated, Tortuous Attenuated, Tortuous   Periphery Peripheral cystoid degeneration inferiorly, retinal tear at 1200 and 2 retinal tears at 0130 + SRF from 1130-0200 (focal RD); shallow pocket of peripheral SRF from 0930 to 1100. Good laser barricade surrounding focal RD's, inferior periphery obscured by settling VH. Small RT @ 10:30 ora Pigmented laser changes 0130 and 0800, mild pigment cystoid degeneration inferiorly; Mild patch of lattice at 0430 and 0600           IMAGING AND PROCEDURES  Imaging and Procedures for 05/04/2022  OCT, Retina - OU - Both Eyes       Right Eye Quality was good. Central Foveal Thickness: 279. Progression has been stable. Findings include normal foveal contour, myopic contour, intraretinal fluid, subretinal fluid (Interval improvement persistent vitreous opacities; Trace cystic changes sup fovea -- slightly improved, pockets of peripheral SRF- ST/SN periphery caught on wide field).   Left Eye Quality was good. Central Foveal Thickness: 277. Progression has been stable. Findings include normal foveal contour, no IRF, no SRF, myopic contour.   Notes *Images captured and stored on drive  Diagnosis / Impression:  OD: Mild persistent vitreous opacities; Trace cystic  changes sup fovea--slightly improved, pockets of peripheral SRF- ST/SN quads caught on wide  field OS: NFP; no IRF/SRF   Clinical management:  See below  Abbreviations: NFP - Normal foveal profile. CME - cystoid macular edema. PED - pigment epithelial detachment. IRF - intraretinal fluid. SRF - subretinal fluid. EZ - ellipsoid zone. ERM - epiretinal membrane. ORA - outer retinal atrophy. ORT - outer retinal tubulation. SRHM - subretinal hyper-reflective material. IRHM - intraretinal hyper-reflective material      LASER PROCEDURE NOTE   Procedure:     Supplemental barrier laser retinopexy using slit lamp laser, RIGHT eye    Diagnosis:       Retinal tear w/ surrounding SRF and focal retinal detachment, RIGHT eye                       Flap tears at 1200 and 0130 o'clock anterior to equator                        +SRF / focal RD spanning 1130-0200 and 0930-1100 oclock   Surgeon:        Bernarda Caffey, MD, PhD   Anesthesia:    Topical   Informed consent obtained, operative eye marked, and time out performed prior to initiation of laser.    Laser settings:  Lumenis Smart532 laser, slit lamp Lens: Mainster PRP 165 Power: 300 mW Spot size: 200 microns Duration: 30 msec  # spots: 158   Placement of laser: Using a [place]ster PRP 165 contact lens at the slit lamp, supplemental laser was placed posterior to focal superonasal RD 1130-0200 oclock anterior to equator.   Complications: None.   Patient tolerated the procedure well and received written and verbal post-procedure care information/education       ASSESSMENT/PLAN:    ICD-10-CM   1. Posterior vitreous detachment of both eyes  H43.813 OCT, Retina - OU - Both Eyes    2. Right retinal detachment  H33.21     3. Retinal tear of right eye  H33.311     4. Vitreous hemorrhage of right eye ([place])  H43.11 OCT, Retina - OU - Both Eyes    5. History of repair of retinal tear by laser photocoagulation  Z98.890     6. Right retinoschisis   H33.101     7. Pseudophakia  Z96.1      1,2. Hemorrhagic PVD OD, PVD OS  - acute symptomatic flashes/red floaters OD, onset 06.15.23 -- pt reports improved floaters and FOL  - OS w/ old PVD w/ retinal tears in 2012 -- s/p laser retinopexy OS by JDM -- good laser in place  - VH improved but significant old condensations settled inferiorly  3,4. Retinal tears w/ +SRF/focal peripheral retinal detachment, OD.   - horseshoe tears located 0130 (2), 1200 and 1030 - focal peripheral RDs from 1130-0200 and 0930-1100 - s/p laser retinopexy OD 07.14.23 -- good laser changes in place - focal areas of thin barricade in SN quad - BCVA 20/20 OD--stable - OCT OD Interval improvement in vitreous opacities; Trace cystic changes sup fovea--slightly improved, pockets of peripheral SRF- ST/SN quads caught on wide field - recommend supplemental laser retinopexy OD in SN quad - RBA of procedure discussed, questions answered - informed consent obtained and signed - see procedure note  - start PF QID OD x5-7 days - f/u in 3 wks for DFE, OCT  5. History of RT OS  - history of PVD w/ retinal tears in 2012 - s/p laser retinopexy OS by JDM -- good laser  in place  - no new RT/RD  - continue to monitor  6. Retinoschisis - myopic retionschisis noted on OCT -- temporal macula and nasal midzone  7. Pseudophakia OU  - s/p CE/IOL OU, Jan 2023 by Dr. Alanda Slim  - IOLs in good position, doing well  - continue to monitor   Ophthalmic Meds Ordered this visit:  No orders of the defined types were placed in this encounter.    Return in about 3 weeks (around 05/25/2022) for f/u 3 weeks, retinal tears OD, DFE, OCT.  There are no Patient Instructions on file for this visit.   Explained the diagnoses, plan, and follow up with the patient and they expressed understanding.  Patient expressed understanding of the importance of proper follow up care.   This document serves as a record of services personally performed by  Gardiner Sleeper, MD, PhD. It was created on their behalf by Orvan Falconer, an ophthalmic technician. The creation of this record is the provider's dictation and/or activities during the visit.    Electronically signed by: Orvan Falconer, OA, 05/04/22  1:57 PM  This document serves as a record of services personally performed by Gardiner Sleeper, MD, PhD. It was created on their behalf by San Jetty. Owens Shark, OA an ophthalmic technician. The creation of this record is the provider's dictation and/or activities during the visit.    Electronically signed by: San Jetty. Owens Shark, [place] 08.02.2023 1:57 PM  Gardiner Sleeper, M.D., Ph.D. Diseases & Surgery of the Retina and Vitreous [facility]  I have reviewed the above documentation for accuracy and completeness, and I agree with the above. Gardiner Sleeper, M.D., Ph.D. 05/04/22 2:03 PM\   Abbreviations: M myopia (nearsighted); A astigmatism; H hyperopia (farsighted); P presbyopia; Mrx spectacle prescription;  CTL contact lenses; OD right eye; OS left eye; OU both eyes  XT exotropia; ET esotropia; PEK punctate epithelial keratitis; PEE punctate epithelial erosions; DES dry eye syndrome; MGD meibomian gland dysfunction; ATs artificial tears; PFAT's preservative free artificial tears; [place] nuclear sclerotic cataract; PSC posterior subcapsular cataract; ERM epi-retinal membrane; PVD posterior vitreous detachment; RD retinal detachment; DM diabetes mellitus; DR diabetic retinopathy; NPDR non-proliferative diabetic retinopathy; PDR proliferative diabetic retinopathy; CSME clinically significant macular edema; DME diabetic macular edema; dbh dot blot hemorrhages; CWS cotton wool spot; POAG primary open angle glaucoma; C/D cup-to-disc ratio; HVF humphrey visual field; GVF goldmann visual field; OCT optical coherence tomography; IOP intraocular pressure; BRVO Branch retinal vein occlusion; CRVO central retinal vein occlusion; CRAO central retinal  artery occlusion; BRAO branch retinal artery occlusion; RT retinal tear; SB scleral buckle; PPV pars plana vitrectomy; VH Vitreous hemorrhage; PRP panretinal laser photocoagulation; IVK intravitreal kenalog; VMT vitreomacular traction; MH Macular hole;  NVD neovascularization of the disc; NVE neovascularization elsewhere; AREDS age related eye disease study; ARMD age related macular degeneration; POAG primary open angle glaucoma; EBMD epithelial/anterior basement membrane dystrophy; ACIOL anterior chamber intraocular lens; IOL intraocular lens; PCIOL posterior chamber intraocular lens; Phaco/IOL phacoemulsification with intraocular lens placement; [place] photorefractive keratectomy; LASIK laser assisted in situ keratomileusis; HTN hypertension; DM diabetes mellitus; COPD chronic obstructive pulmonary disease

## 2022-05-04 ENCOUNTER — Ambulatory Visit (INDEPENDENT_AMBULATORY_CARE_PROVIDER_SITE_OTHER): Payer: 59 | Admitting: Ophthalmology

## 2022-05-04 ENCOUNTER — Encounter (INDEPENDENT_AMBULATORY_CARE_PROVIDER_SITE_OTHER): Payer: Self-pay | Admitting: Ophthalmology

## 2022-05-04 DIAGNOSIS — H43813 Vitreous degeneration, bilateral: Secondary | ICD-10-CM

## 2022-05-04 DIAGNOSIS — Z9889 Other specified postprocedural states: Secondary | ICD-10-CM

## 2022-05-04 DIAGNOSIS — H3321 Serous retinal detachment, right eye: Secondary | ICD-10-CM | POA: Diagnosis not present

## 2022-05-04 DIAGNOSIS — H4311 Vitreous hemorrhage, right eye: Secondary | ICD-10-CM | POA: Diagnosis not present

## 2022-05-04 DIAGNOSIS — H33311 Horseshoe tear of retina without detachment, right eye: Secondary | ICD-10-CM | POA: Diagnosis not present

## 2022-05-04 DIAGNOSIS — Z961 Presence of intraocular lens: Secondary | ICD-10-CM

## 2022-05-04 DIAGNOSIS — H33101 Unspecified retinoschisis, right eye: Secondary | ICD-10-CM

## 2022-05-18 NOTE — Progress Notes (Signed)
[facility] Note  05/25/2022     CHIEF COMPLAINT Patient presents for Retina Follow Up   HISTORY OF PRESENT ILLNESS: Bradley Meyer is a 66 y.o. male who presents to the clinic today for:   HPI     Retina Follow Up   Patient presents with  PVD.  In both eyes.  Severity is moderate.  Duration of 3 weeks.  Since onset it is stable.  I, the attending physician,  performed the HPI with the patient and updated documentation appropriately.        Comments   Pt here for 3 wk ret f/u PVD OU. Pt states VA the same, no changes.       Last edited by Bernarda Caffey, MD on 05/27/2022  1:03 AM.    Patient states floaters improving OD.   Referring physician: Colon Branch, MD [address] [place],  [place] [address]  HISTORICAL INFORMATION:   Selected notes from the MEDICAL RECORD NUMBER Referred by: self referral LEE:  Ocular Hx- PMH-    CURRENT MEDICATIONS: No current outpatient medications on file. (Ophthalmic Drugs)   No current facility-administered medications for this visit. (Ophthalmic Drugs)   Current Outpatient Medications (Other)  Medication Sig   Cholecalciferol (VITAMIN D3 PO) Take 2 tablets by mouth daily.   Zinc 25 MG TABS Take 25 mg by mouth daily.   No current facility-administered medications for this visit. (Other)   REVIEW OF SYSTEMS: ROS   Positive for: Eyes Negative for: Constitutional, Gastrointestinal, Neurological, Skin, Genitourinary, Musculoskeletal, HENT, Endocrine, Cardiovascular, Respiratory, Psychiatric, Allergic/Imm, Heme/Lymph Last edited by Kingsley Spittle, COT on 05/25/2022  2:20 PM.      ALLERGIES No Known Allergies  PAST MEDICAL HISTORY Past Medical History:  Diagnosis Date   BCC (basal cell carcinoma of skin)    nose 2006   Colon polyp    Kidney stones    Palpitation     2010, saw [facility] (w/u per patient negative)     Vitreous detachment 05-2010   Past Surgical  History:  Procedure Laterality Date   APPENDECTOMY  1971   CATARACT EXTRACTION Bilateral 10/2021   Dr. Alanda Slim   KNEE ARTHROSCOPY  2002   right   NASAL SEPTUM SURGERY  05/2018   POLYPECTOMY     FAMILY HISTORY Family History  Problem Relation Age of Onset   Prostate cancer Father 10   Bladder Cancer Father    CAD Other        uncle, in his 73s   Colon polyps Mother    Colon cancer Neg Hx    Stomach cancer Neg Hx    Diabetes Neg Hx    Kidney disease Neg Hx    Gallbladder disease Neg Hx    Esophageal cancer Neg Hx    Rectal cancer Neg Hx    SOCIAL HISTORY Social History   Tobacco Use   Smoking status: Former    Packs/day: 0.10    Years: 12.00    Total pack years: 1.20    Types: Cigarettes    Quit date: 07/01/2007    Years since quitting: 14.9   Smokeless tobacco: Never  Vaping Use   Vaping Use: Never used  Substance Use Topics   Alcohol use: Yes    Comment: Occassionally   Drug use: No       OPHTHALMIC EXAM:  Base Eye Exam     Visual Acuity (Snellen - Linear)  Right Left   Dist cc 20/20 20/20 -2    Correction: Glasses         Tonometry (Tonopen, 2:23 PM)       Right Left   Pressure 11 14         Pupils       Dark Light Shape React APD   Right 3 2 Round Brisk None   Left 3 2 Round Brisk None         Visual Fields (Counting fingers)       Left Right    Full Full         Extraocular Movement       Right Left    Full, Ortho Full, Ortho         Neuro/Psych     Oriented x3: Yes   Mood/Affect: Normal         Dilation     Both eyes: 2.5% Phenylephrine, 1.0% Mydriacyl @ 2:23 PM           Slit Lamp and Fundus Exam     External Exam       Right Left   External Normal Normal         Slit Lamp Exam       Right Left   Lids/Lashes Dermatochalasis - upper lid, Meibomian gland dysfunction Dermatochalasis - upper lid, Meibomian gland dysfunction   Conjunctiva/Sclera White and quiet White and quiet   Cornea  Well healed temporal cataract wound, mild EBMD Well healed temporal cataract wound, mild EBMD   Anterior Chamber Deep and clear Deep and quiet   Iris Round and dilated Round and reactive   Lens PC IOL in good position PC IOL in good position, trace PCO   Anterior Vitreous Vitreous syneresis, fine pigment, Posterior vitreous detachment, VH improved, persistent condensations Vitreous syneresis, trace pigment, Posterior vitreous detachment, Weiss ring         Fundus Exam       Right Left   Disc Pink and sharp, Tilted disc, +PPA mild Pallor, 360 PPA   C/D Ratio 0.2 0.3   Macula Flat, Blunted foveal reflex, RPE mottling and clumping, No heme or edema Flat, Good foveal reflex, RPE mottling and clumping   Vessels Attenuated, Tortuous Attenuated, Tortuous   Periphery Peripheral cystoid degeneration inferiorly, retinal tear at 1200 and 2 retinal tears at 0130 + SRF from 1130-0200 (focal RD); shallow pocket of peripheral SRF from 0930 to 1100. Good laser barricade surrounding focal RD's, Small RT @ 10:30 ora with good laser surrounding Pigmented laser changes 0130 and 0800, mild pigment cystoid degeneration inferiorly; Mild patch of lattice at 0430 and 0600           IMAGING AND PROCEDURES  Imaging and Procedures for 05/25/2022  OCT, Retina - OU - Both Eyes       Right Eye Quality was good. Central Foveal Thickness: 277. Progression has been stable. Findings include normal foveal contour, myopic contour, intraretinal fluid, subretinal fluid (Interval improvement persistent vitreous opacities; Trace cystic changes sup fovea -- slightly improved, pockets of peripheral SRF- ST/SN periphery caught on wide field--not imaged today).   Left Eye Quality was good. Central Foveal Thickness: 277. Progression has been stable. Findings include normal foveal contour, no IRF, no SRF, myopic contour.   Notes *Images captured and stored on drive  Diagnosis / Impression:  OD: Mild persistent vitreous  opacities--interval improvement; Trace cystic changes sup fovea--stab;e, pockets of peripheral SRF- ST/SN quads caught on wide field--not imaged  today OS: NFP; no IRF/SRF   Clinical management:  See below  Abbreviations: NFP - Normal foveal profile. CME - cystoid macular edema. PED - pigment epithelial detachment. IRF - intraretinal fluid. SRF - subretinal fluid. EZ - ellipsoid zone. ERM - epiretinal membrane. ORA - outer retinal atrophy. ORT - outer retinal tubulation. SRHM - subretinal hyper-reflective material. IRHM - intraretinal hyper-reflective material            ASSESSMENT/PLAN:    ICD-10-CM   1. Posterior vitreous detachment of both eyes  H43.813 OCT, Retina - OU - Both Eyes    2. Retinal tear of right eye  H33.311     3. Vitreous hemorrhage of right eye ([place])  H43.11     4. Right retinal detachment  H33.21 OCT, Retina - OU - Both Eyes    5. History of repair of retinal tear by laser photocoagulation  Z98.890     6. 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 Pseudophakia  Z96.1       1,2. Hemorrhagic PVD OD, PVD OS  - acute symptomatic flashes/red floaters OD, onset 06.15.23 -- pt reports improved floaters and FOL  - OS w/ old PVD w/ retinal tears in 2012 -- s/p laser retinopexy OS by JDM -- good laser in place  - VH improved but significant old condensations settled inferiorly  3,4. Retinal tears w/ +SRF/focal peripheral retinal detachment, OD.   - horseshoe tears located 0130 (2), 1200 and 1030 - focal peripheral RDs from 1130-0200 and 0930-1100 - s/p laser retinopexy OD (07.14.23), (08.02.23)-- good barricade laser in place - BCVA 20/20 OD--stable - OCT OD Interval improvement in vitreous opacities; Trace cystic changes sup fovea--slightly improved, pockets of peripheral SRF- ST/SN quads caught on wide field--not imaged today - f/u in 4-6 wks for DFE, OCT, sooner prn  5. History of RT OS  - history of PVD w/ retinal tears in 2012 - s/p laser retinopexy OS by JDM  -- good laser in place  - no new RT/RD  - continue to monitor  6. Retinoschisis - myopic retionschisis noted on OCT -- temporal macula and nasal midzone  7. Pseudophakia OU  - s/p CE/IOL OU, Jan 2023 by Dr. Alanda Slim  - IOLs in good position, doing well  - continue to monitor  Ophthalmic Meds Ordered this visit:  No orders of the defined types were placed in this encounter.    Return for 4-6 wks - RD OD - DFE, OCT.  There are no Patient Instructions on file for this visit.  Explained the diagnoses, plan, and follow up with the patient and they expressed understanding.  Patient expressed understanding of the importance of proper follow up care.   This document serves as a record of services personally performed by Gardiner Sleeper, MD, PhD. It was created on their behalf by Orvan Falconer, an ophthalmic technician. The creation of this record is the provider's dictation and/or activities during the visit.    Electronically signed by: Orvan Falconer, OA, 05/27/22  1:07 AM  This document serves as a record of services personally performed by Gardiner Sleeper, MD, PhD. It was created on their behalf by Roselee Nova, COMT. The creation of this record is the provider's dictation and/or activities during the visit.  Electronically signed by: Roselee Nova, COMT 05/27/22 1:07 AM  Gardiner Sleeper, M.D., Ph.D. Diseases & Surgery of the Retina and Vitreous [facility]  I have reviewed the above documentation for  accuracy and completeness, and I agree with the above. Gardiner Sleeper, M.D., Ph.D. 05/27/22 1:09 AM   Abbreviations: M myopia (nearsighted); A astigmatism; H hyperopia (farsighted); P presbyopia; Mrx spectacle prescription;  CTL contact lenses; OD right eye; OS left eye; OU both eyes  XT exotropia; ET esotropia; PEK punctate epithelial keratitis; PEE punctate epithelial erosions; DES dry eye syndrome; MGD meibomian gland dysfunction; ATs artificial tears;  PFAT's preservative free artificial tears; [place] nuclear sclerotic cataract; PSC posterior subcapsular cataract; ERM epi-retinal membrane; PVD posterior vitreous detachment; RD retinal detachment; DM diabetes mellitus; DR diabetic retinopathy; NPDR non-proliferative diabetic retinopathy; PDR proliferative diabetic retinopathy; CSME clinically significant macular edema; DME diabetic macular edema; dbh dot blot hemorrhages; CWS cotton wool spot; POAG primary open angle glaucoma; C/D cup-to-disc ratio; HVF humphrey visual field; GVF goldmann visual field; OCT optical coherence tomography; IOP intraocular pressure; BRVO Branch retinal vein occlusion; CRVO central retinal vein occlusion; CRAO central retinal artery occlusion; BRAO branch retinal artery occlusion; RT retinal tear; SB scleral buckle; PPV pars plana vitrectomy; VH Vitreous hemorrhage; PRP panretinal laser photocoagulation; IVK intravitreal kenalog; VMT vitreomacular traction; MH Macular hole;  NVD neovascularization of the disc; NVE neovascularization elsewhere; AREDS age related eye disease study; ARMD age related macular degeneration; POAG primary open angle glaucoma; EBMD epithelial/anterior basement membrane dystrophy; ACIOL anterior chamber intraocular lens; IOL intraocular lens; PCIOL posterior chamber intraocular lens; Phaco/IOL phacoemulsification with intraocular lens placement; [place] photorefractive keratectomy; LASIK laser assisted in situ keratomileusis; HTN hypertension; DM diabetes mellitus; COPD chronic obstructive pulmonary disease

## 2022-05-25 ENCOUNTER — Ambulatory Visit (INDEPENDENT_AMBULATORY_CARE_PROVIDER_SITE_OTHER): Payer: 59 | Admitting: Ophthalmology

## 2022-05-25 ENCOUNTER — Encounter (INDEPENDENT_AMBULATORY_CARE_PROVIDER_SITE_OTHER): Payer: Self-pay | Admitting: Ophthalmology

## 2022-05-25 DIAGNOSIS — H33311 Horseshoe tear of retina without detachment, right eye: Secondary | ICD-10-CM

## 2022-05-25 DIAGNOSIS — Z9889 Other specified postprocedural states: Secondary | ICD-10-CM

## 2022-05-25 DIAGNOSIS — H3321 Serous retinal detachment, right eye: Secondary | ICD-10-CM

## 2022-05-25 DIAGNOSIS — H33101 Unspecified retinoschisis, right eye: Secondary | ICD-10-CM

## 2022-05-25 DIAGNOSIS — H4311 Vitreous hemorrhage, right eye: Secondary | ICD-10-CM

## 2022-05-25 DIAGNOSIS — Z961 Presence of intraocular lens: Secondary | ICD-10-CM

## 2022-05-25 DIAGNOSIS — H43813 Vitreous degeneration, bilateral: Secondary | ICD-10-CM

## 2022-05-27 ENCOUNTER — Encounter (INDEPENDENT_AMBULATORY_CARE_PROVIDER_SITE_OTHER): Payer: Self-pay | Admitting: Ophthalmology

## 2022-06-03 ENCOUNTER — Ambulatory Visit (INDEPENDENT_AMBULATORY_CARE_PROVIDER_SITE_OTHER): Payer: 59 | Admitting: Ophthalmology

## 2022-06-03 ENCOUNTER — Encounter (INDEPENDENT_AMBULATORY_CARE_PROVIDER_SITE_OTHER): Payer: Self-pay | Admitting: Ophthalmology

## 2022-06-03 DIAGNOSIS — H43813 Vitreous degeneration, bilateral: Secondary | ICD-10-CM

## 2022-06-03 DIAGNOSIS — H33101 Unspecified retinoschisis, right eye: Secondary | ICD-10-CM

## 2022-06-03 DIAGNOSIS — H4311 Vitreous hemorrhage, right eye: Secondary | ICD-10-CM

## 2022-06-03 DIAGNOSIS — H33311 Horseshoe tear of retina without detachment, right eye: Secondary | ICD-10-CM

## 2022-06-03 DIAGNOSIS — H3321 Serous retinal detachment, right eye: Secondary | ICD-10-CM

## 2022-06-03 DIAGNOSIS — Z9889 Other specified postprocedural states: Secondary | ICD-10-CM

## 2022-06-03 NOTE — Progress Notes (Signed)
[facility] Note  06/03/2022     CHIEF COMPLAINT Patient presents for Retina Evaluation   HISTORY OF PRESENT ILLNESS: Bradley Meyer is a 66 y.o. male who presents to the clinic today for:   HPI     Retina Evaluation   In right eye.  This started 1 week ago.  Duration of 3 days.  Associated Symptoms Flashes and Floaters.  I, the attending physician,  performed the HPI with the patient and updated documentation appropriately.        Comments   Patient here for retina evaluation. Patient states vision the same. 1 weeks ago started seeing a wide streak that goes sown 1 third of eye. Three days ago started seeing a big floater in OD. Saw flashes of light the top part of OD. No eye pain. Has light sensitivity.      Last edited by Bernarda Caffey, MD on 06/03/2022  1:01 PM.    Patient states a new floater appeared in his right eye on Tuesday, he states afterwards, he started seeing new fol of light, he states they were not small streaks, but larger and white, he feels like it took up a 1/3 of his eye, he only sees it when his eye is closed  Referring physician: Colon Branch, MD [address] [place],  [place] [id number]  HISTORICAL INFORMATION:   Selected notes from the MEDICAL RECORD NUMBER Referred by: self referral LEE:  Ocular Hx- PMH-    CURRENT MEDICATIONS: No current outpatient medications on file. (Ophthalmic Drugs)   No current facility-administered medications for this visit. (Ophthalmic Drugs)   Current Outpatient Medications (Other)  Medication Sig   Cholecalciferol (VITAMIN D3 PO) Take 2 tablets by mouth daily.   Zinc 25 MG TABS Take 25 mg by mouth daily.   No current facility-administered medications for this visit. (Other)   REVIEW OF SYSTEMS: ROS   Positive for: Eyes Negative for: Constitutional, Gastrointestinal, Neurological, Skin, Genitourinary, Musculoskeletal, HENT, Endocrine, Cardiovascular, Respiratory,  Psychiatric, Allergic/Imm, Heme/Lymph Last edited by Theodore Demark, COA on 06/03/2022  8:30 AM.     ALLERGIES No Known Allergies  PAST MEDICAL HISTORY Past Medical History:  Diagnosis Date   BCC (basal cell carcinoma of skin)    nose 2006   Colon polyp    Kidney stones    Palpitation     2010, saw [facility] Cardiology (w/u per patient negative)     Vitreous detachment 05-2010   Past Surgical History:  Procedure Laterality Date   APPENDECTOMY  1971   CATARACT EXTRACTION Bilateral 10/2021   Dr. Alanda Slim   KNEE ARTHROSCOPY  2002   right   NASAL SEPTUM SURGERY  05/2018   POLYPECTOMY     FAMILY HISTORY Family History  Problem Relation Age of Onset   Prostate cancer Father 72   Bladder Cancer Father    CAD Other        uncle, in his 58s   Colon polyps Mother    Colon cancer Neg Hx    Stomach cancer Neg Hx    Diabetes Neg Hx    Kidney disease Neg Hx    Gallbladder disease Neg Hx    Esophageal cancer Neg Hx    Rectal cancer Neg Hx    SOCIAL HISTORY Social History   Tobacco Use   Smoking status: Former    Packs/day: 0.10    Years: 12.00    Total pack years: 1.20  Types: Cigarettes    Quit date: 07/01/2007    Years since quitting: 14.9   Smokeless tobacco: Never  Vaping Use   Vaping Use: Never used  Substance Use Topics   Alcohol use: Yes    Comment: Occassionally   Drug use: No       OPHTHALMIC EXAM:  Base Eye Exam     Visual Acuity (Snellen - Linear)       Right Left   Dist cc 20/20 20/20    Correction: Glasses         Tonometry (Tonopen, 8:28 AM)       Right Left   Pressure 16 16         Pupils       Dark Light Shape React APD   Right 3 2 Round Brisk None   Left 3 2 Round Brisk None         Visual Fields (Counting fingers)       Left Right    Full Full         Extraocular Movement       Right Left    Full, Ortho Full, Ortho         Neuro/Psych     Oriented x3: Yes   Mood/Affect: Normal         Dilation      Both eyes: 1.0% Mydriacyl, 2.5% Phenylephrine @ 8:28 AM           Slit Lamp and Fundus Exam     External Exam       Right Left   External Normal Normal         Slit Lamp Exam       Right Left   Lids/Lashes Dermatochalasis - upper lid, Meibomian gland dysfunction Dermatochalasis - upper lid, Meibomian gland dysfunction   Conjunctiva/Sclera White and quiet White and quiet   Cornea Well healed temporal cataract wound, mild EBMD, trace PEE, trace tear film debris Well healed temporal cataract wound, mild EBMD   Anterior Chamber Deep and clear Deep and quiet   Iris Round and dilated Round and reactive   Lens PC IOL in good position PC IOL in good position, trace PCO   Anterior Vitreous Vitreous syneresis, +pigment, Posterior vitreous detachment, VH improved, persistent condensations Vitreous syneresis, trace pigment, Posterior vitreous detachment, Weiss ring         Fundus Exam       Right Left   Disc Pink and sharp, Tilted disc, +PPA mild Pallor, 360 PPA   C/D Ratio 0.2 0.3   Macula Flat, Blunted foveal reflex, RPE mottling and clumping, No heme or edema Flat, Good foveal reflex, RPE mottling and clumping   Vessels Attenuated, mild tortuosity Attenuated, Tortuous   Periphery Peripheral cystoid degeneration inferiorly, retinal tear at 1200 and 2 retinal tears at 0130 + SRF from 1130-0200 (focal RD); shallow pocket of peripheral SRF from 0930 to 1100. Good laser barricade surrounding focal RD's, Small RT @ 10:30 ora with good laser surrounding Pigmented laser changes 0130 and 0800, mild pigment cystoid degeneration inferiorly; Mild patch of lattice at 0430 and 0600           IMAGING AND PROCEDURES  Imaging and Procedures for 06/03/2022  OCT, Retina - OU - Both Eyes       Right Eye Quality was good. Central Foveal Thickness: 275. Progression has been stable. Findings include normal foveal contour, myopic contour, intraretinal fluid, subretinal fluid (Mild interval  improvement persistent vitreous opacities; Trace  cystic changes sup fovea -- persistent, pockets of peripheral SRF- ST/SN periphery caught on wide field).   Left Eye Quality was good. Central Foveal Thickness: 273. Progression has been stable. Findings include normal foveal contour, no IRF, no SRF, myopic contour.   Notes *Images captured and stored on drive  Diagnosis / Impression:  OD: Mild interval improvement persistent vitreous opacities; Trace cystic changes sup fovea -- persistent, pockets of peripheral SRF- ST/SN periphery caught on wide field OS: NFP; no IRF/SRF   Clinical management:  See below  Abbreviations: NFP - Normal foveal profile. CME - cystoid macular edema. PED - pigment epithelial detachment. IRF - intraretinal fluid. SRF - subretinal fluid. EZ - ellipsoid zone. ERM - epiretinal membrane. ORA - outer retinal atrophy. ORT - outer retinal tubulation. SRHM - subretinal hyper-reflective material. IRHM - intraretinal hyper-reflective material            ASSESSMENT/PLAN:    ICD-10-CM   1. Posterior vitreous detachment of both eyes  H43.813 OCT, Retina - OU - Both Eyes    2. Retinal tear of right eye  H33.311     3. Vitreous hemorrhage of right eye ([place])  H43.11     4. Right retinal detachment  H33.21 OCT, Retina - OU - Both Eyes    5. History of repair of retinal tear by laser photocoagulation  Z98.890     6. Right retinoschisis  H33.101      1,2. Hemorrhagic PVD OD, PVD OS  - acute symptomatic flashes/red floaters OD, onset 06.15.23 -- pt reports improved floaters and FOL  - OS w/ old PVD w/ retinal tears in 2012 -- s/p laser retinopexy OS by JDM -- good laser in place  - VH improved but significant old condensations settled inferiorly  3,4. Retinal tears w/ +SRF/focal peripheral retinal detachment, OD.   - horseshoe tears located 0130 (2), 1200 and 1030 - focal peripheral RDs from 1130-0200 and 0930-1100 - s/p laser retinopexy OD (07.14.23),  (08.02.23)-- good barricade laser in place - BCVA 20/20 OD--stable - OCT OD Interval improvement in vitreous opacities; Trace cystic changes sup fovea -- slightly improved, pockets of peripheral SRF- ST/SN quads caught on wide field - pt continues to experience various forms of photopsias -- broad vertical beams of light OD most recently - exam remains stable with no progression of RDs; no new RT; good laser barricade in place - f/u as scheduled for DFE, OCT, sooner prn  5. History of RT OS  - history of PVD w/ retinal tears in 2012 - s/p laser retinopexy OS by JDM -- good laser in place  - no new RT/RD  - continue to monitor  6. Retinoschisis - myopic retionschisis noted on OCT -- temporal macula and nasal midzone  7. Pseudophakia OU  - s/p CE/IOL OU, Jan 2023 by Dr. Alanda Slim  - IOLs in good position, doing well  - continue to monitor  Ophthalmic Meds Ordered this visit:  No orders of the defined types were placed in this encounter.    Return for f/u as scheduled PVD OD.  There are no Patient Instructions on file for this visit.  Explained the diagnoses, plan, and follow up with the patient and they expressed understanding.  Patient expressed understanding of the importance of proper follow up care.   This document serves as a record of services personally performed by Gardiner Sleeper, MD, PhD. It was created on their behalf by San Jetty. Owens Shark, OA an ophthalmic technician. The creation of  this record is the provider's dictation and/or activities during the visit.    Electronically signed by: San Jetty. [place], [place] 09.01.2023 1:02 PM  Gardiner Sleeper, M.D., Ph.D. Diseases & Surgery of the Retina and Vitreous [facility]  I have reviewed the above documentation for accuracy and completeness, and I agree with the above. Gardiner Sleeper, M.D., Ph.D. 06/03/22 1:04 PM  Abbreviations: M myopia (nearsighted); A astigmatism; H hyperopia (farsighted); P presbyopia;  Mrx spectacle prescription;  CTL contact lenses; OD right eye; OS left eye; OU both eyes  XT exotropia; ET esotropia; PEK punctate epithelial keratitis; PEE punctate epithelial erosions; DES dry eye syndrome; MGD meibomian gland dysfunction; ATs artificial tears; PFAT's preservative free artificial tears; [place] nuclear sclerotic cataract; PSC posterior subcapsular cataract; ERM epi-retinal membrane; PVD posterior vitreous detachment; RD retinal detachment; DM diabetes mellitus; DR diabetic retinopathy; NPDR non-proliferative diabetic retinopathy; PDR proliferative diabetic retinopathy; CSME clinically significant macular edema; DME diabetic macular edema; dbh dot blot hemorrhages; CWS cotton wool spot; POAG primary open angle glaucoma; C/D cup-to-disc ratio; HVF humphrey visual field; GVF goldmann visual field; OCT optical coherence tomography; IOP intraocular pressure; BRVO Branch retinal vein occlusion; CRVO central retinal vein occlusion; CRAO central retinal artery occlusion; BRAO branch retinal artery occlusion; RT retinal tear; SB scleral buckle; PPV pars plana vitrectomy; VH Vitreous hemorrhage; PRP panretinal laser photocoagulation; IVK intravitreal kenalog; VMT vitreomacular traction; MH Macular hole;  NVD neovascularization of the disc; NVE neovascularization elsewhere; AREDS age related eye disease study; ARMD age related macular degeneration; POAG primary open angle glaucoma; EBMD epithelial/anterior basement membrane dystrophy; ACIOL anterior chamber intraocular lens; IOL intraocular lens; PCIOL posterior chamber intraocular lens; Phaco/IOL phacoemulsification with intraocular lens placement; [place] photorefractive keratectomy; LASIK laser assisted in situ keratomileusis; HTN hypertension; DM diabetes mellitus; COPD chronic obstructive pulmonary disease

## 2022-06-21 NOTE — Progress Notes (Signed)
[facility] Note  06/22/2022     CHIEF COMPLAINT Patient presents for Retina Follow Up   HISTORY OF PRESENT ILLNESS: Bradley Meyer is a 66 y.o. male who presents to the clinic today for:   HPI     Retina Follow Up   Patient presents with  PVD.  In both eyes.  This started months ago.  Duration of 3 weeks.  Since onset it is stable.  I, the attending physician,  performed the HPI with the patient and updated documentation appropriately.        Comments   Patient feels that their has been no changes with is vision.       Last edited by Bernarda Caffey, MD on 06/24/2022 11:31 PM.     Patient feels that the vision is doing well.   Referring physician: Colon Branch, MD [address] [place],  [place] [id number]  HISTORICAL INFORMATION:   Selected notes from the MEDICAL RECORD NUMBER Referred by: self referral LEE:  Ocular Hx- PMH-    CURRENT MEDICATIONS: No current outpatient medications on file. (Ophthalmic Drugs)   No current facility-administered medications for this visit. (Ophthalmic Drugs)   Current Outpatient Medications (Other)  Medication Sig   Cholecalciferol (VITAMIN D3 PO) Take 2 tablets by mouth daily.   Zinc 25 MG TABS Take 25 mg by mouth daily.   No current facility-administered medications for this visit. (Other)   REVIEW OF SYSTEMS: ROS   Positive for: Eyes Negative for: Constitutional, Gastrointestinal, Neurological, Skin, Genitourinary, Musculoskeletal, HENT, Endocrine, Cardiovascular, Respiratory, Psychiatric, Allergic/Imm, Heme/Lymph Last edited by Annie Paras, COT on 06/22/2022  9:37 AM.     ALLERGIES No Known Allergies  PAST MEDICAL HISTORY Past Medical History:  Diagnosis Date   BCC (basal cell carcinoma of skin)    nose 2006   Colon polyp    Kidney stones    Palpitation     2010, saw [facility] Cardiology (w/u per patient negative)     Vitreous detachment 05-2010   Past Surgical  History:  Procedure Laterality Date   APPENDECTOMY  1971   CATARACT EXTRACTION Bilateral 10/2021   Dr. Alanda Slim   KNEE ARTHROSCOPY  2002   right   NASAL SEPTUM SURGERY  05/2018   POLYPECTOMY     FAMILY HISTORY Family History  Problem Relation Age of Onset   Prostate cancer Father 71   Bladder Cancer Father    CAD Other        uncle, in his 40s   Colon polyps Mother    Colon cancer Neg Hx    Stomach cancer Neg Hx    Diabetes Neg Hx    Kidney disease Neg Hx    Gallbladder disease Neg Hx    Esophageal cancer Neg Hx    Rectal cancer Neg Hx    SOCIAL HISTORY Social History   Tobacco Use   Smoking status: Former    Packs/day: 0.10    Years: 12.00    Total pack years: 1.20    Types: Cigarettes    Quit date: 07/01/2007    Years since quitting: 14.9   Smokeless tobacco: Never  Vaping Use   Vaping Use: Never used  Substance Use Topics   Alcohol use: Yes    Comment: Occassionally   Drug use: No       OPHTHALMIC EXAM:  Base Eye Exam     Visual Acuity (Snellen - Linear)  Right Left   Dist cc 20/20 20/20    Correction: Glasses         Tonometry (Tonopen, 9:39 AM)       Right Left   Pressure 12 12         Pupils       Dark Light Shape React APD   Right 3 2 Round Brisk None   Left 3 2 Round Brisk None         Visual Fields       Left Right    Full Full         Extraocular Movement       Right Left    Full, Ortho Full, Ortho         Neuro/Psych     Oriented x3: Yes   Mood/Affect: Normal         Dilation     Both eyes: 1.0% Mydriacyl, 2.5% Phenylephrine @ 9:38 AM           Slit Lamp and Fundus Exam     External Exam       Right Left   External Normal Normal         Slit Lamp Exam       Right Left   Lids/Lashes Dermatochalasis - upper lid, Meibomian gland dysfunction Dermatochalasis - upper lid, Meibomian gland dysfunction   Conjunctiva/Sclera White and quiet White and quiet   Cornea Well healed temporal  cataract wound, mild EBMD, trace PEE, trace tear film debris Well healed temporal cataract wound, mild EBMD   Anterior Chamber Deep and clear Deep and quiet   Iris Round and dilated Round and reactive   Lens PC IOL in good position PC IOL in good position, trace PCO   Anterior Vitreous Vitreous syneresis, +pigment, Posterior vitreous detachment, VH improved, persistent condensations Vitreous syneresis, trace pigment, Posterior vitreous detachment, Weiss ring         Fundus Exam       Right Left   Disc Pink and sharp, Tilted disc, +PPA mild Pallor, 360 PPA   C/D Ratio 0.2 0.3   Macula Flat, Blunted foveal reflex, RPE mottling and clumping, No heme or edema Flat, Good foveal reflex, RPE mottling and clumping   Vessels Attenuated, mild tortuosity Attenuated, Tortuous   Periphery Peripheral cystoid degeneration inferiorly, retinal tear at 1200 and 2 retinal tears at 0130 + SRF from 1130-0200 (focal RD); shallow pocket of peripheral SRF from 0930 to 1100. Good laser barricade surrounding focal RD's, Small RT @ 10:30 ora with good laser surrounding Pigmented laser changes 0130 and 0800, mild pigment cystoid degeneration inferiorly; Mild patch of lattice at 0430 and 0600           IMAGING AND PROCEDURES  Imaging and Procedures for 06/22/2022  OCT, Retina - OU - Both Eyes       Right Eye Quality was good. Central Foveal Thickness: 279. Progression has been stable. Findings include normal foveal contour, myopic contour, intraretinal fluid, subretinal fluid (Mild interval improvement persistent vitreous opacities; Trace cystic changes sup fovea -- persistent, pockets of peripheral SRF- ST/SN periphery caught on wide field).   Left Eye Quality was good. Central Foveal Thickness: 274. Progression has been stable. Findings include normal foveal contour, no IRF, no SRF, myopic contour.   Notes *Images captured and stored on drive  Diagnosis / Impression:  OD: Mild interval improvement  persistent vitreous opacities; Trace cystic changes sup fovea -- persistent, pockets of peripheral SRF- ST/SN periphery caught  on wide field OS: NFP; no IRF/SRF   Clinical management:  See below  Abbreviations: NFP - Normal foveal profile. CME - cystoid macular edema. PED - pigment epithelial detachment. IRF - intraretinal fluid. SRF - subretinal fluid. EZ - ellipsoid zone. ERM - epiretinal membrane. ORA - outer retinal atrophy. ORT - outer retinal tubulation. SRHM - subretinal hyper-reflective material. IRHM - intraretinal hyper-reflective material            ASSESSMENT/PLAN:    ICD-10-CM   1. Posterior vitreous detachment of both eyes  H43.813 OCT, Retina - OU - Both Eyes    2. Retinal tear of right eye  H33.311     3. Vitreous hemorrhage of right eye ([place])  H43.11     4. Right retinal detachment  H33.21 OCT, Retina - OU - Both Eyes    5. History of repair of retinal tear by laser photocoagulation  Z98.890     6. 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 Pseudophakia  Z96.1      1,2. Hemorrhagic PVD OD, PVD OS  - acute symptomatic flashes/red floaters OD, onset 06.15.23 -- pt reports improved floaters and FOL  - OS w/ old PVD w/ retinal tears in 2012 -- s/p laser retinopexy OS by JDM -- good laser in place  - VH improved but significant old condensations settled inferiorly  3,4. Retinal tears w/ +SRF/focal peripheral retinal detachment, OD.   - horseshoe tears located 0130 (2), 1200 and 1030 - focal peripheral RDs from 1130-0200 and 0930-1100 - s/p laser retinopexy OD (07.14.23), (08.02.23)-- good barricade laser in place - BCVA 20/20 OD--stable - OCT OD Interval improvement in vitreous opacities; Trace cystic changes sup fovea -- slightly improved, pockets of peripheral SRF- ST/SN quads caught on wide field - pt continues to experience various forms of photopsias -- broad vertical beams of light OD most recently - exam remains stable with no progression of RDs; no new RT;  good laser barricade in place - f/u in 4 wks, sooner prn -- DFE, OCT  5. History of RT OS  - history of PVD w/ retinal tears in 2012 - s/p laser retinopexy OS by JDM -- good laser in place  - no new RT/RD  - continue to monitor  6. Retinoschisis - myopic retionschisis noted on OCT -- temporal macula and nasal midzone  7. Pseudophakia OU  - s/p CE/IOL OU, Jan 2023 by Dr. Alanda Slim  - IOLs in good position, doing well  - continue to monitor  Ophthalmic Meds Ordered this visit:  No orders of the defined types were placed in this encounter.    Return in about 4 weeks (around 07/20/2022) for f/u Retinal tears OD, DFE, OCT.  There are no Patient Instructions on file for this visit.  Explained the diagnoses, plan, and follow up with the patient and they expressed understanding.  Patient expressed understanding of the importance of proper follow up care.   This document serves as a record of services personally performed by Gardiner Sleeper, MD, PhD. It was created on their behalf by San Jetty. Owens Shark, OA an ophthalmic technician. The creation of this record is the provider's dictation and/or activities during the visit.    Electronically signed by: San Jetty. Owens Shark, [place] 09.19.2023 11:33 PM  This document serves as a record of services personally performed by Gardiner Sleeper, MD, PhD. It was created on their behalf by Renaldo Reel, [place] an ophthalmic technician. The creation of this record is the  provider's dictation and/or activities during the visit.    Electronically signed by:  Renaldo Reel, COT  06/22/22 11:33 PM  Gardiner Sleeper, M.D., Ph.D. Diseases & Surgery of the Retina and Vitreous [facility]  I have reviewed the above documentation for accuracy and completeness, and I agree with the above. Gardiner Sleeper, M.D., Ph.D. 06/24/22 11:34 PM   Abbreviations: M myopia (nearsighted); A astigmatism; H hyperopia (farsighted); P presbyopia; Mrx spectacle  prescription;  CTL contact lenses; OD right eye; OS left eye; OU both eyes  XT exotropia; ET esotropia; PEK punctate epithelial keratitis; PEE punctate epithelial erosions; DES dry eye syndrome; MGD meibomian gland dysfunction; ATs artificial tears; PFAT's preservative free artificial tears; [place] nuclear sclerotic cataract; PSC posterior subcapsular cataract; ERM epi-retinal membrane; PVD posterior vitreous detachment; RD retinal detachment; DM diabetes mellitus; DR diabetic retinopathy; NPDR non-proliferative diabetic retinopathy; PDR proliferative diabetic retinopathy; CSME clinically significant macular edema; DME diabetic macular edema; dbh dot blot hemorrhages; CWS cotton wool spot; POAG primary open angle glaucoma; C/D cup-to-disc ratio; HVF humphrey visual field; GVF goldmann visual field; OCT optical coherence tomography; IOP intraocular pressure; BRVO Branch retinal vein occlusion; CRVO central retinal vein occlusion; CRAO central retinal artery occlusion; BRAO branch retinal artery occlusion; RT retinal tear; SB scleral buckle; PPV pars plana vitrectomy; VH Vitreous hemorrhage; PRP panretinal laser photocoagulation; IVK intravitreal kenalog; VMT vitreomacular traction; MH Macular hole;  NVD neovascularization of the disc; NVE neovascularization elsewhere; AREDS age related eye disease study; ARMD age related macular degeneration; POAG primary open angle glaucoma; EBMD epithelial/anterior basement membrane dystrophy; ACIOL anterior chamber intraocular lens; IOL intraocular lens; PCIOL posterior chamber intraocular lens; Phaco/IOL phacoemulsification with intraocular lens placement; [place] photorefractive keratectomy; LASIK laser assisted in situ keratomileusis; HTN hypertension; DM diabetes mellitus; COPD chronic obstructive pulmonary disease

## 2022-06-22 ENCOUNTER — Ambulatory Visit (INDEPENDENT_AMBULATORY_CARE_PROVIDER_SITE_OTHER): Payer: 59 | Admitting: Ophthalmology

## 2022-06-22 DIAGNOSIS — H33101 Unspecified retinoschisis, right eye: Secondary | ICD-10-CM

## 2022-06-22 DIAGNOSIS — Z9889 Other specified postprocedural states: Secondary | ICD-10-CM

## 2022-06-22 DIAGNOSIS — H33311 Horseshoe tear of retina without detachment, right eye: Secondary | ICD-10-CM

## 2022-06-22 DIAGNOSIS — H4311 Vitreous hemorrhage, right eye: Secondary | ICD-10-CM

## 2022-06-22 DIAGNOSIS — H3321 Serous retinal detachment, right eye: Secondary | ICD-10-CM | POA: Diagnosis not present

## 2022-06-22 DIAGNOSIS — Z961 Presence of intraocular lens: Secondary | ICD-10-CM

## 2022-06-22 DIAGNOSIS — H43813 Vitreous degeneration, bilateral: Secondary | ICD-10-CM

## 2022-06-24 ENCOUNTER — Encounter (INDEPENDENT_AMBULATORY_CARE_PROVIDER_SITE_OTHER): Payer: Self-pay | Admitting: Ophthalmology

## 2022-07-15 NOTE — Progress Notes (Signed)
[facility] - Clinic Note  07/20/2022     CHIEF COMPLAINT Patient presents for Retina Follow Up   HISTORY OF PRESENT ILLNESS: Bradley Meyer is a 66 y.o. male who presents to the clinic today for:   HPI     Retina Follow Up   Patient presents with  Other.  In both eyes.  Severity is moderate.  Duration of 4 weeks.  Since onset it is stable.  I, the attending physician,  performed the HPI with the patient and updated documentation appropriately.        Comments   Pt here for 4 wk ret f/u PVD OU. Pt states VA is about the same though he does report a new floater in OD-two lines on top of each other that seem to come and go. Started last week. OD seems a little cloudier.       Last edited by Rennis Chris, MD on 07/20/2022  1:38 PM.      Referring physician: Wanda Plump, MD [address] [place],  [place] [address]  HISTORICAL INFORMATION:   Selected notes from the MEDICAL RECORD NUMBER Referred by: self referral LEE:  Ocular Hx- PMH-    CURRENT MEDICATIONS: No current outpatient medications on file. (Ophthalmic Drugs)   No current facility-administered medications for this visit. (Ophthalmic Drugs)   Current Outpatient Medications (Other)  Medication Sig   Cholecalciferol (VITAMIN D3 PO) Take 2 tablets by mouth daily.   Zinc 25 MG TABS Take 25 mg by mouth daily.   No current facility-administered medications for this visit. (Other)   REVIEW OF SYSTEMS: ROS   Positive for: Eyes Negative for: Constitutional, Gastrointestinal, Neurological, Skin, Genitourinary, Musculoskeletal, HENT, Endocrine, Cardiovascular, Respiratory, Psychiatric, Allergic/Imm, Heme/Lymph Last edited by Thompson Grayer, COT on 07/20/2022  1:32 PM.      ALLERGIES No Known Allergies  PAST MEDICAL HISTORY Past Medical History:  Diagnosis Date   BCC (basal cell carcinoma of skin)    nose 2006   Colon polyp    Kidney stones    Palpitation      2010, saw [facility] (w/u per patient negative)     Vitreous detachment 05-2010   Past Surgical History:  Procedure Laterality Date   APPENDECTOMY  1971   CATARACT EXTRACTION Bilateral 10/2021   Dr. Genia Del   KNEE ARTHROSCOPY  2002   right   NASAL SEPTUM SURGERY  05/2018   POLYPECTOMY     FAMILY HISTORY Family History  Problem Relation Age of Onset   Prostate cancer Father 83   Bladder Cancer Father    CAD Other        uncle, in his 66s   Colon polyps Mother    Colon cancer Neg Hx    Stomach cancer Neg Hx    Diabetes Neg Hx    Kidney disease Neg Hx    Gallbladder disease Neg Hx    Esophageal cancer Neg Hx    Rectal cancer Neg Hx    SOCIAL HISTORY Social History   Tobacco Use   Smoking status: Former    Packs/day: 0.10    Years: 12.00    Total pack years: 1.20    Types: Cigarettes    Quit date: 07/01/2007    Years since quitting: 15.0   Smokeless tobacco: Never  Vaping Use   Vaping Use: Never used  Substance Use Topics   Alcohol use: Yes    Comment: Occassionally   Drug  use: No       OPHTHALMIC EXAM:  Base Eye Exam     Visual Acuity (Snellen - Linear)       Right Left   Dist cc 20/20 -1 20/20 -1    Correction: Glasses         Tonometry (Tonopen, 1:36 PM)       Right Left   Pressure 16 14         Pupils       Pupils Dark Light Shape React APD   Right PERRL 3 2 Round Brisk None   Left PERRL 3 2 Round Brisk None         Visual Fields (Counting fingers)       Left Right    Full Full         Extraocular Movement       Right Left    Full, Ortho Full, Ortho         Neuro/Psych     Oriented x3: Yes   Mood/Affect: Normal         Dilation     Both eyes: 1.0% Mydriacyl, 2.5% Phenylephrine @ 1:37 PM           Slit Lamp and Fundus Exam     External Exam       Right Left   External Normal Normal         Slit Lamp Exam       Right Left   Lids/Lashes Dermatochalasis - upper lid, Meibomian gland  dysfunction Dermatochalasis - upper lid, Meibomian gland dysfunction   Conjunctiva/Sclera White and quiet White and quiet   Cornea Well healed temporal cataract wound, mild EBMD, trace PEE, trace tear film debris Well healed temporal cataract wound, mild EBMD   Anterior Chamber Deep and clear Deep and quiet   Iris Round and dilated Round and reactive   Lens PC IOL in good position PC IOL in good position, trace PCO   Anterior Vitreous Vitreous syneresis, +pigment, Posterior vitreous detachment, VH improved, persistent condensations Vitreous syneresis, trace pigment, Posterior vitreous detachment, Weiss ring         Fundus Exam       Right Left   Disc Pink and sharp, Tilted disc, +PPA mild Pallor, 360 PPA   C/D Ratio 0.2 0.3   Macula Flat, Blunted foveal reflex, RPE mottling and clumping, No heme or edema Flat, Good foveal reflex, RPE mottling and clumping   Vessels Attenuated, mild tortuosity Attenuated, Tortuous   Periphery Peripheral cystoid degeneration inferiorly, retinal tear at 1200 and 2 retinal tears at 0130 + SRF from 1130-0200 (focal RD); shallow pocket of peripheral SRF from 0930 to 1100. Good laser barricade surrounding focal RD's, Small RT @ 10:30 ora with good laser surrounding, no new RT/RD Pigmented laser changes 0130 and 0800, mild pigment cystoid degeneration inferiorly; Mild patches of lattice at 0430 and 0600, pigmented CR scar at 730           IMAGING AND PROCEDURES  Imaging and Procedures for 07/20/2022  OCT, Retina - OU - Both Eyes       Right Eye Quality was good. Central Foveal Thickness: 281. Progression has been stable. Findings include normal foveal contour, no IRF, myopic contour, intraretinal fluid, subretinal fluid (Mild interval improvement persistent vitreous opacities; Trace cystic changes sup fovea -- persistent, pockets of peripheral SRF- ST/SN periphery caught on wide field-- not imaged today).   Left Eye Quality was good. Central Foveal  Thickness: 276. Progression  has been stable. Findings include normal foveal contour, no IRF, no SRF, myopic contour.   Notes *Images captured and stored on drive  Diagnosis / Impression:  OD: Mild interval improvement persistent vitreous opacities; Trace cystic changes sup fovea -- persistent, pockets of peripheral SRF- ST/SN periphery caught on wide field-- not imaged today OS: NFP; no IRF/SRF   Clinical management:  See below  Abbreviations: NFP - Normal foveal profile. CME - cystoid macular edema. PED - pigment epithelial detachment. IRF - intraretinal fluid. SRF - subretinal fluid. EZ - ellipsoid zone. ERM - epiretinal membrane. ORA - outer retinal atrophy. ORT - outer retinal tubulation. SRHM - subretinal hyper-reflective material. IRHM - intraretinal hyper-reflective material            ASSESSMENT/PLAN:    ICD-10-CM   1. Posterior vitreous detachment of both eyes  H43.813 OCT, Retina - OU - Both Eyes    2. Retinal tear of right eye  H33.311     3. Vitreous hemorrhage of right eye (HCC)  H43.11     4. Right retinal detachment  H33.21 OCT, Retina - OU - Both Eyes    5. History of repair of retinal tear by laser photocoagulation  Z98.890     6. Lattice degeneration of left retina  H35.412     7. Right retinoschisis  H33.101     8. Pseudophakia  Z96.1       1,2. Hemorrhagic PVD OD, PVD OS  - acute symptomatic flashes/red floaters OD, onset 06.15.23 -- pt reports improved floaters and FOL  - OS w/ old PVD w/ retinal tears in 2012 -- s/p laser retinopexy OS by JDM -- good laser in place  - VH improved but significant old condensations settled inferiorly  3,4. Retinal tears w/ +SRF/focal peripheral retinal detachment, OD.   - horseshoe tears located 0130 (2), 1200 and 1030 - focal peripheral RDs from 1130-0200 and 0930-1100 - s/p laser retinopexy OD (07.14.23), (08.02.23)-- good barricade laser in place - BCVA 20/20 OD--stable - OCT OD Mild interval improvement  persistent vitreous opacities; Trace cystic changes sup fovea -- persistent, pockets of peripheral SRF- ST/SN periphery caught on wide field-- not imaged today - pt continues to experience various forms of photopsias -- broad vertical beams of light OD most recently - exam remains stable with no progression of RDs; no new RT; good laser barricade in place - f/u in 2 months, sooner prn -- DFE, OCT  5. History of RT OS  - history of PVD w/ retinal tears in 2012 - s/p laser retinopexy OS by JDM -- good laser in place  - no new RT/RD  - continue to monitor  6. Lattice degeneration OS - focal patches of pigmented lattice inferiorly (0430 and 0600) - discussed findings, prognosis, and treatment options including observation - recommend observation for now, but discussed possible prophylactic retinopexy in the future - f/u in 2 mos, sooner prn   7. Retinoschisis - myopic retionschisis noted on OCT -- temporal macula and nasal midzone  8. Pseudophakia OU  - s/p CE/IOL OU, Jan 2023 by Dr. Genia Del  - IOLs in good position, doing well  - continue to monitor  Ophthalmic Meds Ordered this visit:  No orders of the defined types were placed in this encounter.    Return in about 2 months (around 09/19/2022) for f/u PVD OU, DFE, OCT.  There are no Patient Instructions on file for this visit.  Explained the diagnoses, plan, and follow up with the patient  and they expressed understanding.  Patient expressed understanding of the importance of proper follow up care.   This document serves as a record of services personally performed by Karie Chimera, MD, PhD. It was created on their behalf by De Blanch, an ophthalmic technician. The creation of this record is the provider's dictation and/or activities during the visit.    Electronically signed by: De Blanch, OA, 07/20/22  2:23 PM  This document serves as a record of services personally performed by Karie Chimera, MD, PhD. It was  created on their behalf by Gerilyn Nestle, COT an ophthalmic technician. The creation of this record is the provider's dictation and/or activities during the visit.    Electronically signed by:  Gerilyn Nestle, COT  10.18.23 2:23 PM  Karie Chimera, M.D., Ph.D. Diseases & Surgery of the Retina and Vitreous [facility]  I have reviewed the above documentation for accuracy and completeness, and I agree with the above. Karie Chimera, M.D., Ph.D. 07/20/22 2:23 PM  Abbreviations: M myopia (nearsighted); A astigmatism; H hyperopia (farsighted); P presbyopia; Mrx spectacle prescription;  CTL contact lenses; OD right eye; OS left eye; OU both eyes  XT exotropia; ET esotropia; PEK punctate epithelial keratitis; PEE punctate epithelial erosions; DES dry eye syndrome; MGD meibomian gland dysfunction; ATs artificial tears; PFAT's preservative free artificial tears; NSC nuclear sclerotic cataract; PSC posterior subcapsular cataract; ERM epi-retinal membrane; PVD posterior vitreous detachment; RD retinal detachment; DM diabetes mellitus; DR diabetic retinopathy; NPDR non-proliferative diabetic retinopathy; PDR proliferative diabetic retinopathy; CSME clinically significant macular edema; DME diabetic macular edema; dbh dot blot hemorrhages; CWS cotton wool spot; POAG primary open angle glaucoma; C/D cup-to-disc ratio; HVF humphrey visual field; GVF goldmann visual field; OCT optical coherence tomography; IOP intraocular pressure; BRVO Branch retinal vein occlusion; CRVO central retinal vein occlusion; CRAO central retinal artery occlusion; BRAO branch retinal artery occlusion; RT retinal tear; SB scleral buckle; PPV pars plana vitrectomy; VH Vitreous hemorrhage; PRP panretinal laser photocoagulation; IVK intravitreal kenalog; VMT vitreomacular traction; MH Macular hole;  NVD neovascularization of the disc; NVE neovascularization elsewhere; AREDS age related eye disease study; ARMD age  related macular degeneration; POAG primary open angle glaucoma; EBMD epithelial/anterior basement membrane dystrophy; ACIOL anterior chamber intraocular lens; IOL intraocular lens; PCIOL posterior chamber intraocular lens; Phaco/IOL phacoemulsification with intraocular lens placement; PRK photorefractive keratectomy; LASIK laser assisted in situ keratomileusis; HTN hypertension; DM diabetes mellitus; COPD chronic obstructive pulmonary disease

## 2022-07-20 ENCOUNTER — Encounter (INDEPENDENT_AMBULATORY_CARE_PROVIDER_SITE_OTHER): Payer: Self-pay | Admitting: Ophthalmology

## 2022-07-20 ENCOUNTER — Ambulatory Visit (INDEPENDENT_AMBULATORY_CARE_PROVIDER_SITE_OTHER): Payer: 59 | Admitting: Ophthalmology

## 2022-07-20 DIAGNOSIS — H33311 Horseshoe tear of retina without detachment, right eye: Secondary | ICD-10-CM

## 2022-07-20 DIAGNOSIS — H43813 Vitreous degeneration, bilateral: Secondary | ICD-10-CM

## 2022-07-20 DIAGNOSIS — H4311 Vitreous hemorrhage, right eye: Secondary | ICD-10-CM | POA: Diagnosis not present

## 2022-07-20 DIAGNOSIS — H3321 Serous retinal detachment, right eye: Secondary | ICD-10-CM

## 2022-07-20 DIAGNOSIS — Z9889 Other specified postprocedural states: Secondary | ICD-10-CM

## 2022-07-20 DIAGNOSIS — Z961 Presence of intraocular lens: Secondary | ICD-10-CM

## 2022-07-20 DIAGNOSIS — H33101 Unspecified retinoschisis, right eye: Secondary | ICD-10-CM

## 2022-07-20 DIAGNOSIS — H35412 Lattice degeneration of retina, left eye: Secondary | ICD-10-CM

## 2022-07-27 ENCOUNTER — Ambulatory Visit (INDEPENDENT_AMBULATORY_CARE_PROVIDER_SITE_OTHER): Payer: 59 | Admitting: Internal Medicine

## 2022-07-27 ENCOUNTER — Encounter: Payer: Self-pay | Admitting: Internal Medicine

## 2022-07-27 VITALS — BP 118/70 | HR 66 | Temp 97.9°F | Resp 16 | Ht 71.0 in | Wt 156.4 lb

## 2022-07-27 DIAGNOSIS — I700 Atherosclerosis of aorta: Secondary | ICD-10-CM | POA: Diagnosis not present

## 2022-07-27 DIAGNOSIS — Z23 Encounter for immunization: Secondary | ICD-10-CM

## 2022-07-27 DIAGNOSIS — Z0000 Encounter for general adult medical examination without abnormal findings: Secondary | ICD-10-CM | POA: Diagnosis not present

## 2022-07-27 NOTE — Patient Instructions (Addendum)
Vaccines I recommend:  Covid booster RSV vaccine   GO TO THE LAB : Get the blood work     [place], Carey back for   a checkup in 6 months    Advanced care planning:  Do you have a "Living will", "Independence of attorney" ?   If you already have a living will or healthcare power of attorney, is recommended you bring the copy to be scanned in your chart. The document will be available to all the doctors you see in the system.  If you don't have one, please consider create one.    More information at:  [email or web address]

## 2022-07-27 NOTE — Progress Notes (Unsigned)
Subjective:    Patient ID: Bradley Meyer, male    DOB: November 02, 1955, 66 y.o.   MRN: [id number]  DOS:  07/27/2022 Type of visit - description: CPX Here for CPX Feeling well. Has occasional cough when he lays down and some throat irritation, it happens with certain foods, he thinks is acid reflux. Occasional stomach discomfort, not a new thing, this is a chronic issue.   Review of Systems See above   Past Medical History:  Diagnosis Date   BCC (basal cell carcinoma of skin)    nose 2006   Colon polyp    Kidney stones    Palpitation     2010, saw [facility] (w/u per patient negative)     Vitreous detachment 05-2010    Past Surgical History:  Procedure Laterality Date   APPENDECTOMY  1971   CATARACT EXTRACTION Bilateral 10/2021   Dr. Alanda Slim   KNEE ARTHROSCOPY  2002   right   NASAL SEPTUM SURGERY  05/2018   POLYPECTOMY      Current Outpatient Medications  Medication Instructions   Cholecalciferol (VITAMIN D3 PO) 2 tablets, Daily   Zinc 25 mg, Daily       Objective:   Physical Exam BP 118/70   Pulse 66   Temp 97.9 F (36.6 C) (Oral)   Resp 16   Ht '5\' 11"'$  (1.803 m)   Wt 156 lb 6 oz (70.9 kg)   SpO2 96%   BMI 21.81 kg/m  General: Well developed, NAD, BMI noted Neck: No  thyromegaly  HEENT:  Normocephalic . Face symmetric, atraumatic Lungs:  CTA B Normal respiratory effort, no intercostal retractions, no accessory muscle use. Heart: RRR,  no murmur.  Abdomen:  Not distended, soft, non-tender. No rebound or rigidity.   Lower extremities: no pretibial edema bilaterally DRE: Normal sphincter, no stools, prostate nonenlarged Skin: Exposed areas without rash. Not pale. Not jaundice Neurologic:  alert & oriented X3.  Speech normal, gait appropriate for age and unassisted Strength symmetric and appropriate for age.  Psych: Cognition and judgment appear intact.  Cooperative with normal attention span and concentration.  Behavior appropriate. No  anxious or depressed appearing.     Assessment     Assessment H/o Anxiety H/o Palpitations 2010, saw cards, workup (-) per pt BCC nose 2016, sees derm q year L Vitreous detachment 2011 R vitreous  detachment and retinal tear 04-2022  Chronic abdominal pain: 2017- saw GI, EGD, colonoscopy, biopsies, CT unrevealing. Sx resolved with gluten-free diet and other dietary changes Vegetarian  Liver cysts-- see OV 07-28-2021   PLAN: Here for CPX BCC: History of, rec to see dermatology regularly Aortic sclerosis: Last cholesterol satisfactory, nevertheless we spoke about the statins and aspirin, again we agreed to get a calcium coronary score. RTC 6 months   -Td 2016 - s/p  shingrix - RSV, covid vax: pro>cons d/w pt  -flu shot : today -CCS: 05/[place]random biopsies were normal, terminal ileum was normal appearing and normal by biopsy, 28m pedunculated TVA was removed from rectum Colonoscopy 07/2011--- next 5 years cscope 07-2016 (-), 10 years -Prostate cancer screening: no sx, F dx w/ prostate cancer age 66 died at age 89  DRE negative, check PSA. -Labs:  bmp cbc psa -Has a healthy lifestyle.  He retired, has more time to exercise essentially daily.  Eats healthy. - Healthcare POA discussed.   4-23 History of fatigue: Much improved, checking labs History of back pain: Symptoms resolved Aortic sclerosis: Checking  labs, very active w/o CV sxs. Reassess need for statins regularly Social: He remains quite active, to retire in August.  He is very excited about it. RTC 07-2022 CPX

## 2022-07-28 ENCOUNTER — Encounter: Payer: Self-pay | Admitting: Internal Medicine

## 2022-07-28 ENCOUNTER — Telehealth (HOSPITAL_BASED_OUTPATIENT_CLINIC_OR_DEPARTMENT_OTHER): Payer: Self-pay

## 2022-07-28 NOTE — Assessment & Plan Note (Signed)
Here for CPX BCC: History of, rec to see dermatology regularly Aortic sclerosis: Last cholesterol satisfactory, nevertheless we spoke about the benefit of statins and aspirin.  At the end, we agreed to get a calcium coronary score which will help him decide further steps. RTC 6 months

## 2022-07-28 NOTE — Assessment & Plan Note (Signed)
-  Td 2016 - s/p  shingrix - RSV, covid vax: pro>cons d/w pt -flu shot : today -CCS: 05/2008 colonoscopy [place]:random biopsies were normal, terminal ileum was normal appearing and normal by biopsy, 6m pedunculated TVA was removed from rectum Colonoscopy 07/2011--- next 5 years cscope 07-2016 (-), 10 years -Prostate cancer screening: no sx, F dx w/ prostate cancer age 89 died at age 89  DRE negative, check PSA. -Labs:  bmp cbc psa -Has a healthy lifestyle.  He retired, has more time to exercise essentially daily.  Eats healthy. - Healthcare POA discussed.

## 2022-08-02 ENCOUNTER — Ambulatory Visit (HOSPITAL_BASED_OUTPATIENT_CLINIC_OR_DEPARTMENT_OTHER)
Admission: RE | Admit: 2022-08-02 | Discharge: 2022-08-02 | Disposition: A | Discharge status: Home / Self Care | Payer: 59 | Source: Ambulatory Visit | Attending: Internal Medicine | Admitting: Internal Medicine

## 2022-08-02 DIAGNOSIS — I700 Atherosclerosis of aorta: Secondary | ICD-10-CM | POA: Insufficient documentation

## 2022-08-04 ENCOUNTER — Encounter: Payer: Self-pay | Admitting: Internal Medicine

## 2022-09-16 NOTE — Progress Notes (Signed)
[facility] Note  09/19/2022     CHIEF COMPLAINT Patient presents for Retina Follow Up   HISTORY OF PRESENT ILLNESS: Bradley Meyer is a 66 y.o. male who presents to the clinic today for:   HPI     Retina Follow Up   Patient presents with  Other.  In both eyes.  This started 2 months ago.  Severity is moderate.  Duration of 2 months.  Since onset it is stable.  I, the attending physician,  performed the HPI with the patient and updated documentation appropriately.        Comments   2 month retina follow up PVD OU pt states he has noticed wavy lines in the right eye he has flashes and floaters but has not changed       Last edited by Bernarda Caffey, MD on 09/20/2022  1:40 AM.    Pt states vision is not doing too good, he states the floaters have improved, but states that he is seeing some distortion, particularly with straight lines, pt states the distortion is mainly on reflective surfaces, he states his quality of life has diminished quite a bit   Referring physician: Colon Branch, MD [address] [place],   [id number]  HISTORICAL INFORMATION:   Selected notes from the MEDICAL RECORD NUMBER Referred by: self referral LEE:  Ocular Hx- PMH-    CURRENT MEDICATIONS: No current outpatient medications on file. (Ophthalmic Drugs)   No current facility-administered medications for this visit. (Ophthalmic Drugs)   No current outpatient medications on file. (Other)   No current facility-administered medications for this visit. (Other)   REVIEW OF SYSTEMS: ROS   Positive for: Eyes Negative for: Constitutional, Gastrointestinal, Neurological, Skin, Genitourinary, Musculoskeletal, HENT, Endocrine, Cardiovascular, Respiratory, Psychiatric, Allergic/Imm, Heme/Lymph Last edited by Bernarda Caffey, MD on 09/20/2022  1:40 AM.     ALLERGIES No Known Allergies  PAST MEDICAL HISTORY Past Medical History:  Diagnosis Date    BCC (basal cell carcinoma of skin)    nose 2006   Colon polyp    Kidney stones    Palpitation     2010, saw [facility] (w/u per patient negative)     Vitreous detachment 05-2010   Past Surgical History:  Procedure Laterality Date   APPENDECTOMY  1971   CATARACT EXTRACTION Bilateral 10/2021   Dr. Alanda Slim   KNEE ARTHROSCOPY  2002   right   NASAL SEPTUM SURGERY  05/2018   POLYPECTOMY     FAMILY HISTORY Family History  Problem Relation Age of Onset   Prostate cancer Father 62   Bladder Cancer Father    CAD Other        uncle, in his 68s   Colon polyps Mother    Colon cancer Neg Hx    Stomach cancer Neg Hx    Diabetes Neg Hx    Kidney disease Neg Hx    Gallbladder disease Neg Hx    Esophageal cancer Neg Hx    Rectal cancer Neg Hx    SOCIAL HISTORY Social History   Tobacco Use   Smoking status: Former    Packs/day: 0.10    Years: 12.00    Total pack years: 1.20    Types: Cigarettes    Quit date: 07/01/2007    Years since quitting: 15.2   Smokeless tobacco: Never  Vaping Use   Vaping Use: Never used  Substance Use Topics   Alcohol use: Yes  Comment: Occassionally   Drug use: No       OPHTHALMIC EXAM:  Base Eye Exam     Visual Acuity (Snellen - Linear)       Right Left   Dist cc 20/20 20/20 -1    Correction: Glasses         Tonometry (Tonopen, 1:46 PM)       Right Left   Pressure 16 16         Pupils       Pupils Dark Light Shape React APD   Right PERRL 3 2 Round Brisk None   Left PERRL 3 2 Round Brisk None         Visual Fields       Left Right    Full Full         Extraocular Movement       Right Left    Full, Ortho Full, Ortho         Neuro/Psych     Oriented x3: Yes   Mood/Affect: Normal         Dilation     Both eyes: 2.5% Phenylephrine @ 1:46 PM           Slit Lamp and Fundus Exam     External Exam       Right Left   External Normal Normal         Slit Lamp Exam       Right Left    Lids/Lashes Dermatochalasis - upper lid, Meibomian gland dysfunction Dermatochalasis - upper lid, Meibomian gland dysfunction   Conjunctiva/Sclera White and quiet White and quiet   Cornea Well healed temporal cataract wound, mild EBMD Well healed temporal cataract wound, mild EBMD, mild arcus   Anterior Chamber Deep and clear Deep and quiet   Iris Round and dilated Round and dilated   Lens PC IOL in good position PC IOL in good position, trace PCO   Anterior Vitreous Vitreous syneresis, 3+pigment, Posterior vitreous detachment, persistent condensations Vitreous syneresis, trace pigment, Posterior vitreous detachment, Weiss ring         Fundus Exam       Right Left   Disc Pink and sharp, Tilted disc, +PPA mild Pallor, 360 PPA   C/D Ratio 0.2 0.3   Macula Flat, Blunted foveal reflex, RPE mottling and clumping, mild ERM, No heme or edema Flat, Good foveal reflex, RPE mottling and clumping   Vessels Attenuated, mild tortuosity Attenuated, Tortuous   Periphery Attached, Peripheral cystoid degeneration inferiorly, retinal tear at 1200 and 2 retinal tears at 0130 + SRF from 1130-0200 (focal RD); shallow pocket of peripheral SRF from 0930 to 1100, good laser barricade surrounding focal RD's, Small RT @ 10:30 ora with good laser surrounding, no new RT/RD Attached, Pigmented laser changes 0130 and 0800, mild pigment cystoid degeneration inferiorly; Mild patches of lattice at 0430 and 0600, pigmented CR scar at 730 - no holes, no new RT/RD           IMAGING AND PROCEDURES  Imaging and Procedures for 09/19/2022  OCT, Retina - OU - Both Eyes       Right Eye Quality was good. Central Foveal Thickness: 317. Progression has been stable. Findings include normal foveal contour, no IRF, myopic contour, epiretinal membrane, intraretinal fluid, macular pucker, subretinal fluid (Mild progression of ERM centrally, stable improvement vitreous opacities; Trace cystic changes sup fovea -- persistent, pockets  of peripheral SRF- ST/SN periphery caught on wide field-- not imaged today).  Left Eye Quality was good. Central Foveal Thickness: 272. Progression has been stable. Findings include normal foveal contour, no IRF, no SRF, myopic contour.   Notes *Images captured and stored on drive  Diagnosis / Impression:  OD: Mild progression of ERM centrally, stable improvement vitreous opacities; Trace cystic changes sup fovea -- persistent, pockets of peripheral SRF- ST/SN periphery caught on wide field-- not imaged today OS: NFP; no IRF/SRF   Clinical management:  See below  Abbreviations: NFP - Normal foveal profile. CME - cystoid macular edema. PED - pigment epithelial detachment. IRF - intraretinal fluid. SRF - subretinal fluid. EZ - ellipsoid zone. ERM - epiretinal membrane. ORA - outer retinal atrophy. ORT - outer retinal tubulation. SRHM - subretinal hyper-reflective material. IRHM - intraretinal hyper-reflective material            ASSESSMENT/PLAN:    ICD-10-CM   1. Epiretinal membrane (ERM) of right eye  H35.371 OCT, Retina - OU - Both Eyes    2. Posterior vitreous detachment of both eyes  H43.813 OCT, Retina - OU - Both Eyes    3. Vitreous hemorrhage of right eye ([place])  H43.11     4. Right retinal detachment  H33.21     5. Retinal tear of right eye  H33.311     6. History of repair of retinal tear by laser photocoagulation  Z98.890     7. Lattice degeneration of left retina  H35.412     8. Right retinoschisis  H33.101     9. Pseudophakia  Z96.1      Epiretinal membrane, right eye  - The natural history, anatomy, potential for loss of vision, and treatment options including vitrectomy techniques and the complications of endophthalmitis, retinal detachment, vitreous hemorrhage, cataract progression and permanent vision loss discussed with the patient. - ERM w/ mild pucker - BCVA 20/20 - asymptomatic, no metamorphopsia - pt reports significant floaters / debris OS - pt  contemplating surgery for ERM removal -- 25g PPV w/ possible gas - monitor for now - f/u 2 mos -- DFE/OCT  2,3. Hemorrhagic PVD OD, PVD OS  - acute symptomatic flashes/red floaters OD, onset 06.15.23 -- pt reports improved floaters and FOL  - OS w/ old PVD w/ retinal tears in 2012 -- s/p laser retinopexy OS by JDM -- good laser in place  - VH improved but significant old condensations settled inferiorly  4,5. Retinal tears w/ +SRF/focal peripheral retinal detachment, OD.   - horseshoe tears located 0130 (2), 1200 and 1030 - focal peripheral RDs from 1130-0200 and 0930-1100 - s/p laser retinopexy OD (07.14.23), (08.02.23)-- good barricade laser in place - BCVA 20/20 OD--stable - OCT OD Mild interval improvement persistent vitreous opacities; Trace cystic changes sup fovea -- persistent, pockets of peripheral SRF- ST/SN periphery caught on wide field-- not imaged today - pt continues to experience various forms of photopsias -- broad vertical beams of light OD most recently - exam remains stable with no progression of RDs; no new RT; good laser barricade in place - f/u in 3-4 months, sooner prn -- DFE, OCT  6. History of RT OS  - history of PVD w/ retinal tears in 2012 - s/p laser retinopexy OS by JDM -- good laser in place  - no new RT/RD  - continue to monitor  7. Lattice degeneration OS - focal patches of pigmented lattice inferiorly (0430 and 0600) - discussed findings, prognosis, and treatment options including observation - recommend observation for now, but discussed possible prophylactic retinopexy  in the future - monitor   8. Retinoschisis - myopic retionschisis noted on OCT -- temporal macula and nasal midzone  9. Pseudophakia OU  - s/p CE/IOL OU, Jan 2023 by Dr. Alanda Slim  - IOLs in good position, doing well  - continue to monitor  Ophthalmic Meds Ordered this visit:  No orders of the defined types were placed in this encounter.    Return in about 2 months (around  11/20/2022) for f/u ERM OD, DFE, OCT.  There are no Patient Instructions on file for this visit.  Explained the diagnoses, plan, and follow up with the patient and they expressed understanding.  Patient expressed understanding of the importance of proper follow up care.   This document serves as a record of services personally performed by Gardiner Sleeper, MD, PhD. It was created on their behalf by Orvan Falconer, an ophthalmic technician. The creation of this record is the provider's dictation and/or activities during the visit.    Electronically signed by: Orvan Falconer, OA, 09/20/22  1:40 AM  This document serves as a record of services personally performed by Gardiner Sleeper, MD, PhD. It was created on their behalf by San Jetty. Owens Shark, OA an ophthalmic technician. The creation of this record is the provider's dictation and/or activities during the visit.    Electronically signed by: San Jetty. Owens Shark, OA 12.18.2023 1:40 AM   Gardiner Sleeper, M.D., Ph.D. Diseases & Surgery of the Retina and Vitreous [facility]  I have reviewed the above documentation for accuracy and completeness, and I agree with the above. Gardiner Sleeper, M.D., Ph.D. 09/20/22 1:44 AM  Abbreviations: M myopia (nearsighted); A astigmatism; H hyperopia (farsighted); P presbyopia; Mrx spectacle prescription;  CTL contact lenses; OD right eye; OS left eye; OU both eyes  XT exotropia; ET esotropia; PEK punctate epithelial keratitis; PEE punctate epithelial erosions; DES dry eye syndrome; MGD meibomian gland dysfunction; ATs artificial tears; PFAT's preservative free artificial tears; [place] nuclear sclerotic cataract; PSC posterior subcapsular cataract; ERM epi-retinal membrane; PVD posterior vitreous detachment; RD retinal detachment; DM diabetes mellitus; DR diabetic retinopathy; NPDR non-proliferative diabetic retinopathy; PDR proliferative diabetic retinopathy; CSME clinically significant macular  edema; DME diabetic macular edema; dbh dot blot hemorrhages; CWS cotton wool spot; POAG primary open angle glaucoma; C/D cup-to-disc ratio; HVF humphrey visual field; GVF goldmann visual field; OCT optical coherence tomography; IOP intraocular pressure; BRVO Branch retinal vein occlusion; CRVO central retinal vein occlusion; CRAO central retinal artery occlusion; BRAO branch retinal artery occlusion; RT retinal tear; SB scleral buckle; PPV pars plana vitrectomy; VH Vitreous hemorrhage; PRP panretinal laser photocoagulation; IVK intravitreal kenalog; VMT vitreomacular traction; MH Macular hole;  NVD neovascularization of the disc; NVE neovascularization elsewhere; AREDS age related eye disease study; ARMD age related macular degeneration; POAG primary open angle glaucoma; EBMD epithelial/anterior basement membrane dystrophy; ACIOL anterior chamber intraocular lens; IOL intraocular lens; PCIOL posterior chamber intraocular lens; Phaco/IOL phacoemulsification with intraocular lens placement; [place] photorefractive keratectomy; LASIK laser assisted in situ keratomileusis; HTN hypertension; DM diabetes mellitus; COPD chronic obstructive pulmonary disease

## 2022-09-19 ENCOUNTER — Ambulatory Visit (INDEPENDENT_AMBULATORY_CARE_PROVIDER_SITE_OTHER): Payer: 59 | Admitting: Ophthalmology

## 2022-09-19 DIAGNOSIS — H33101 Unspecified retinoschisis, right eye: Secondary | ICD-10-CM

## 2022-09-19 DIAGNOSIS — H3321 Serous retinal detachment, right eye: Secondary | ICD-10-CM | POA: Diagnosis not present

## 2022-09-19 DIAGNOSIS — H43813 Vitreous degeneration, bilateral: Secondary | ICD-10-CM | POA: Diagnosis not present

## 2022-09-19 DIAGNOSIS — H35371 Puckering of macula, right eye: Secondary | ICD-10-CM

## 2022-09-19 DIAGNOSIS — H33311 Horseshoe tear of retina without detachment, right eye: Secondary | ICD-10-CM

## 2022-09-19 DIAGNOSIS — H4311 Vitreous hemorrhage, right eye: Secondary | ICD-10-CM

## 2022-09-19 DIAGNOSIS — H35412 Lattice degeneration of retina, left eye: Secondary | ICD-10-CM

## 2022-09-19 DIAGNOSIS — Z961 Presence of intraocular lens: Secondary | ICD-10-CM

## 2022-09-19 DIAGNOSIS — Z9889 Other specified postprocedural states: Secondary | ICD-10-CM

## 2022-09-20 ENCOUNTER — Encounter (INDEPENDENT_AMBULATORY_CARE_PROVIDER_SITE_OTHER): Payer: Self-pay | Admitting: Ophthalmology

## 2022-11-17 NOTE — Progress Notes (Signed)
[facility] Note  11/21/2022     CHIEF COMPLAINT Patient presents for Retina Follow Up   HISTORY OF PRESENT ILLNESS: Bradley Meyer is a 67 y.o. male who presents to the clinic today for:   HPI     Retina Follow Up   Patient presents with  Other.  In right eye.  This started 2 months ago.  I, the attending physician,  performed the HPI with the patient and updated documentation appropriately.        Comments   Patient here for 2 months retina follow up for ERM OD. Patient states vision doing ok. Tad bit better than last visit. Nothing worse. Uses AT's prn.      Last edited by Bernarda Caffey, MD on 11/21/2022  8:01 PM.    Pt feels like over the past 2 weeks his vision has gotten better, he denies new fol or floaters   Referring physician: Colon Branch, MD [address] [place],  [place] [address]  HISTORICAL INFORMATION:   Selected notes from the MEDICAL RECORD NUMBER Referred by: self referral LEE:  Ocular Hx- PMH-    CURRENT MEDICATIONS: No current outpatient medications on file. (Ophthalmic Drugs)   No current facility-administered medications for this visit. (Ophthalmic Drugs)   No current outpatient medications on file. (Other)   No current facility-administered medications for this visit. (Other)   REVIEW OF SYSTEMS: ROS   Positive for: Eyes Negative for: Constitutional, Gastrointestinal, Neurological, Skin, Genitourinary, Musculoskeletal, HENT, Endocrine, Cardiovascular, Respiratory, Psychiatric, Allergic/Imm, Heme/Lymph Last edited by Theodore Demark, COA on 11/21/2022  2:08 PM.     ALLERGIES No Known Allergies  PAST MEDICAL HISTORY Past Medical History:  Diagnosis Date   BCC (basal cell carcinoma of skin)    nose 2006   Colon polyp    Kidney stones    Palpitation     2010, saw [facility] (w/u per patient negative)     Vitreous detachment 05-2010   Past Surgical History:  Procedure  Laterality Date   APPENDECTOMY  1971   CATARACT EXTRACTION Bilateral 10/2021   Dr. Alanda Slim   KNEE ARTHROSCOPY  2002   right   NASAL SEPTUM SURGERY  05/2018   POLYPECTOMY     FAMILY HISTORY Family History  Problem Relation Age of Onset   Prostate cancer Father 80   Bladder Cancer Father    CAD Other        uncle, in his 32s   Colon polyps Mother    Colon cancer Neg Hx    Stomach cancer Neg Hx    Diabetes Neg Hx    Kidney disease Neg Hx    Gallbladder disease Neg Hx    Esophageal cancer Neg Hx    Rectal cancer Neg Hx    SOCIAL HISTORY Social History   Tobacco Use   Smoking status: Former    Packs/day: 0.10    Years: 12.00    Total pack years: 1.20    Types: Cigarettes    Quit date: 07/01/2007    Years since quitting: 15.4   Smokeless tobacco: Never  Vaping Use   Vaping Use: Never used  Substance Use Topics   Alcohol use: Yes    Comment: Occassionally   Drug use: No       OPHTHALMIC EXAM:  Base Eye Exam     Visual Acuity (Snellen - Linear)       Right Left   Dist  cc 20/20 -1 20/20 -1    Correction: Glasses         Tonometry (Tonopen, 2:06 PM)       Right Left   Pressure 15 17         Pupils       Dark Light Shape React APD   Right 3 2 Round Brisk None   Left 3 2 Round Brisk None         Visual Fields (Counting fingers)       Left Right    Full Full         Extraocular Movement       Right Left    Full, Ortho Full, Ortho         Neuro/Psych     Oriented x3: Yes   Mood/Affect: Normal         Dilation     Both eyes: 1.0% Mydriacyl, 2.5% Phenylephrine @ 2:06 PM           Slit Lamp and Fundus Exam     External Exam       Right Left   External Normal Normal         Slit Lamp Exam       Right Left   Lids/Lashes Dermatochalasis - upper lid, Meibomian gland dysfunction Dermatochalasis - upper lid, Meibomian gland dysfunction   Conjunctiva/Sclera White and quiet White and quiet   Cornea Well healed temporal  cataract wound, mild EBMD Well healed temporal cataract wound, mild EBMD, mild arcus   Anterior Chamber Deep and clear Deep and quiet   Iris Round and dilated Round and dilated   Lens PC IOL in good position PC IOL in good position, trace PCO   Anterior Vitreous Vitreous syneresis, fine pigment, Posterior vitreous detachment, persistent condensations Vitreous syneresis, trace pigment, Posterior vitreous detachment, Weiss ring, vitreous condensations         Fundus Exam       Right Left   Disc Pink and sharp, Tilted disc, +PPA/PPP mild Pallor, 360 PPA   C/D Ratio 0.2 0.1   Macula Flat, Blunted foveal reflex, RPE mottling and clumping, mild ERM, No heme or edema Flat, Good foveal reflex, RPE mottling and clumping   Vessels Attenuated, Tortuous Attenuated, Tortuous   Periphery Attached, Peripheral cystoid degeneration inferiorly, retinal tear at 1200, 2 retinal tears at 0130, +SRF from 1130-0200 (focal RD); shallow pocket of peripheral SRF from 0930 to 1100, good laser barricade surrounding focal RD's, Small retinal tear at 10:30 ora with good laser surrounding, no new RT/RD Attached, Pigmented laser changes 0130 and 0800, mild pigment cystoid degeneration inferiorly; Mild patches of lattice at 0430 and 0600, pigmented CR scar at 730 - no holes, no new RT/RD           IMAGING AND PROCEDURES  Imaging and Procedures for 11/21/2022  OCT, Retina - OU - Both Eyes       Right Eye Quality was good. Central Foveal Thickness: 303. Progression has improved. Findings include normal foveal contour, no IRF, myopic contour, epiretinal membrane, intraretinal fluid, macular pucker, subretinal fluid (Mild ERM, stable improvement vitreous opacities; trace cystic changes sup fovea -- improved, pockets of peripheral SRF ST/SN periphery caught on wide field-- not imaged today).   Left Eye Quality was good. Central Foveal Thickness: 274. Progression has been stable. Findings include normal foveal contour, no  IRF, no SRF, myopic contour.   Notes *Images captured and stored on drive  Diagnosis / Impression:  OD:  Mild ERM, stable improvement vitreous opacities; trace cystic changes sup fovea -- improved, pockets of peripheral SRF ST/SN periphery caught on wide field-- not imaged today OS: NFP; no IRF/SRF   Clinical management:  See below  Abbreviations: NFP - Normal foveal profile. CME - cystoid macular edema. PED - pigment epithelial detachment. IRF - intraretinal fluid. SRF - subretinal fluid. EZ - ellipsoid zone. ERM - epiretinal membrane. ORA - outer retinal atrophy. ORT - outer retinal tubulation. SRHM - subretinal hyper-reflective material. IRHM - intraretinal hyper-reflective material            ASSESSMENT/PLAN:    ICD-10-CM   1. Epiretinal membrane (ERM) of right eye  H35.371 OCT, Retina - OU - Both Eyes    2. Posterior vitreous detachment of both eyes  H43.813     3. Vitreous hemorrhage of right eye ([place])  H43.11     4. Right retinal detachment  H33.21     5. Retinal tear of right eye  H33.311     6. History of repair of retinal tear by laser photocoagulation  Z98.890     7. Lattice degeneration of left retina  H35.412     8. Right retinoschisis  H33.101     9. Pseudophakia  Z96.1      Epiretinal membrane, right eye  - ERM w/ mild pucker - BCVA 20/20 - asymptomatic, no metamorphopsia - pt reports significant floaters / debris OS - pt contemplating surgery for definitive RD repair, and could do ERM removal at the same time -- 25g PPV w/ possible gas - monitor for now - f/u 2 mos -- DFE/OCT  2,3. Hemorrhagic PVD OD, PVD OS  - acute symptomatic flashes/red floaters OD, onset 06.15.23 -- pt reports improved floaters and FOL  - OS w/ old PVD w/ retinal tears in 2012 -- s/p laser retinopexy OS by JDM -- good laser in place  - VH improved but significant old condensations persist  4,5. Retinal tears w/ +SRF/focal peripheral retinal detachment, OD.   - horseshoe  tears located 0130 (2), 1200 and 1030 - focal peripheral RDs from 1130-0200 and 0930-1100 - s/p laser retinopexy OD (07.14.23), (08.02.23) -- good barricade laser in place - BCVA 20/20 OD -- stable - OCT OD: Mild ERM, stable improvement vitreous opacities; trace cystic changes sup fovea -- improved, pockets of peripheral SRF ST/SN periphery caught on wide field-- not imaged today - pt continues to experience various forms of photopsias -- broad vertical beams of light OD most recently - subretinal appears to have progressed a little in right eye -- consider sx for 03.21.23 - f/u March 11, sooner prn -- DFE, OCT  6. History of RT OS  - history of PVD w/ retinal tears in 2012 - s/p laser retinopexy OS by JDM -- good laser in place  - no new RT/RD  - continue to monitor  7. Lattice degeneration OS - focal patches of pigmented lattice inferiorly (0430 and 0600) - discussed findings, prognosis, and treatment options including observation - recommend observation for now, but discussed possible prophylactic retinopexy in the future - monitor   8. Retinoschisis - myopic retionschisis noted on OCT -- temporal macula and nasal midzone  9. Pseudophakia OU  - s/p CE/IOL OU, Jan 2023 by Dr. Alanda Slim  - IOLs in good position, doing well  - continue to monitor  Ophthalmic Meds Ordered this visit:  No orders of the defined types were placed in this encounter.    Return in about 3  weeks (around 12/12/2022) for f/u retinal tears OD, DFE, OCT.  There are no Patient Instructions on file for this visit.  Explained the diagnoses, plan, and follow up with the patient and they expressed understanding.  Patient expressed understanding of the importance of proper follow up care.   This document serves as a record of services personally performed by Gardiner Sleeper, MD, PhD. It was created on their behalf by Orvan Falconer, an ophthalmic technician. The creation of this record is the provider's dictation  and/or activities during the visit.    Electronically signed by: Orvan Falconer, OA, 11/21/22  8:02 PM  This document serves as a record of services personally performed by Gardiner Sleeper, MD, PhD. It was created on their behalf by San Jetty. Owens Shark, OA an ophthalmic technician. The creation of this record is the provider's dictation and/or activities during the visit.    Electronically signed by: San Jetty. Owens Shark, [place] 02.19.2024 8:02 PM  Gardiner Sleeper, M.D., Ph.D. Diseases & Surgery of the Retina and Vitreous [facility]  I have reviewed the above documentation for accuracy and completeness, and I agree with the above. Gardiner Sleeper, M.D., Ph.D. 11/21/22 8:04 PM   Abbreviations: M myopia (nearsighted); A astigmatism; H hyperopia (farsighted); P presbyopia; Mrx spectacle prescription;  CTL contact lenses; OD right eye; OS left eye; OU both eyes  XT exotropia; ET esotropia; PEK punctate epithelial keratitis; PEE punctate epithelial erosions; DES dry eye syndrome; MGD meibomian gland dysfunction; ATs artificial tears; PFAT's preservative free artificial tears; [place] nuclear sclerotic cataract; PSC posterior subcapsular cataract; ERM epi-retinal membrane; PVD posterior vitreous detachment; RD retinal detachment; DM diabetes mellitus; DR diabetic retinopathy; NPDR non-proliferative diabetic retinopathy; PDR proliferative diabetic retinopathy; CSME clinically significant macular edema; DME diabetic macular edema; dbh dot blot hemorrhages; CWS cotton wool spot; POAG primary open angle glaucoma; C/D cup-to-disc ratio; HVF humphrey visual field; GVF goldmann visual field; OCT optical coherence tomography; IOP intraocular pressure; BRVO Branch retinal vein occlusion; CRVO central retinal vein occlusion; CRAO central retinal artery occlusion; BRAO branch retinal artery occlusion; RT retinal tear; SB scleral buckle; PPV pars plana vitrectomy; VH Vitreous hemorrhage; PRP panretinal laser  photocoagulation; IVK intravitreal kenalog; VMT vitreomacular traction; MH Macular hole;  NVD neovascularization of the disc; NVE neovascularization elsewhere; AREDS age related eye disease study; ARMD age related macular degeneration; POAG primary open angle glaucoma; EBMD epithelial/anterior basement membrane dystrophy; ACIOL anterior chamber intraocular lens; IOL intraocular lens; PCIOL posterior chamber intraocular lens; Phaco/IOL phacoemulsification with intraocular lens placement; [place] photorefractive keratectomy; LASIK laser assisted in situ keratomileusis; HTN hypertension; DM diabetes mellitus; COPD chronic obstructive pulmonary disease

## 2022-11-21 ENCOUNTER — Ambulatory Visit (INDEPENDENT_AMBULATORY_CARE_PROVIDER_SITE_OTHER): Payer: 59 | Admitting: Ophthalmology

## 2022-11-21 ENCOUNTER — Encounter (INDEPENDENT_AMBULATORY_CARE_PROVIDER_SITE_OTHER): Payer: Self-pay | Admitting: Ophthalmology

## 2022-11-21 DIAGNOSIS — H4311 Vitreous hemorrhage, right eye: Secondary | ICD-10-CM | POA: Diagnosis not present

## 2022-11-21 DIAGNOSIS — H3321 Serous retinal detachment, right eye: Secondary | ICD-10-CM | POA: Diagnosis not present

## 2022-11-21 DIAGNOSIS — H43813 Vitreous degeneration, bilateral: Secondary | ICD-10-CM

## 2022-11-21 DIAGNOSIS — Z961 Presence of intraocular lens: Secondary | ICD-10-CM

## 2022-11-21 DIAGNOSIS — H33101 Unspecified retinoschisis, right eye: Secondary | ICD-10-CM

## 2022-11-21 DIAGNOSIS — H35412 Lattice degeneration of retina, left eye: Secondary | ICD-10-CM

## 2022-11-21 DIAGNOSIS — H35371 Puckering of macula, right eye: Secondary | ICD-10-CM

## 2022-11-21 DIAGNOSIS — Z9889 Other specified postprocedural states: Secondary | ICD-10-CM

## 2022-11-21 DIAGNOSIS — H33311 Horseshoe tear of retina without detachment, right eye: Secondary | ICD-10-CM

## 2022-12-05 NOTE — Progress Notes (Signed)
[facility] Note  12/12/2022     CHIEF COMPLAINT Patient presents for Retina Follow Up   HISTORY OF PRESENT ILLNESS: Bradley Meyer is a 67 y.o. male who presents to the clinic today for:   HPI     Retina Follow Up   In right eye.  This started 3 weeks ago.  Duration of 3 weeks.  Since onset it is stable.  I, the attending physician,  performed the HPI with the patient and updated documentation appropriately.        Comments   3 week retina follow up pt states no vision changes noticed he is having flashes and floaters but nothing new       Last edited by Bernarda Caffey, MD on 12/12/2022  1:28 PM.    Pt states his floaters have subsided, he states he sees "clouds" in his vision that move around, he states they are very annoying, he gets a lot of glare from lights   Referring physician: Colon Branch, MD [address] [place],  [place] [address]  HISTORICAL INFORMATION:   Selected notes from the [facility] Referred by: self referral LEE:  Ocular Hx- PMH-    CURRENT MEDICATIONS: No current outpatient medications on file. (Ophthalmic Drugs)   No current facility-administered medications for this visit. (Ophthalmic Drugs)   No current outpatient medications on file. (Other)   No current facility-administered medications for this visit. (Other)   REVIEW OF SYSTEMS: ROS   Positive for: Eyes Negative for: Constitutional, Gastrointestinal, Neurological, Skin, Genitourinary, Musculoskeletal, HENT, Endocrine, Cardiovascular, Respiratory, Psychiatric, Allergic/Imm, Heme/Lymph Last edited by Parthenia Ames, COT on 12/12/2022  1:06 PM.      ALLERGIES No Known Allergies  PAST MEDICAL HISTORY Past Medical History:  Diagnosis Date   BCC (basal cell carcinoma of skin)    nose 2006   Colon polyp    Kidney stones    Palpitation     2010, saw [facility] Cardiology (w/u per patient negative)     Vitreous  detachment 05-2010   Past Surgical History:  Procedure Laterality Date   APPENDECTOMY  1971   CATARACT EXTRACTION Bilateral 10/2021   Dr. Alanda Slim   KNEE ARTHROSCOPY  2002   right   NASAL SEPTUM SURGERY  05/2018   POLYPECTOMY     FAMILY HISTORY Family History  Problem Relation Age of Onset   Prostate cancer Father 90   Bladder Cancer Father    CAD Other        uncle, in his 13s   Colon polyps Mother    Colon cancer Neg Hx    Stomach cancer Neg Hx    Diabetes Neg Hx    Kidney disease Neg Hx    Gallbladder disease Neg Hx    Esophageal cancer Neg Hx    Rectal cancer Neg Hx    SOCIAL HISTORY Social History   Tobacco Use   Smoking status: Former    Packs/day: 0.10    Years: 12.00    Total pack years: 1.20    Types: Cigarettes    Quit date: 07/01/2007    Years since quitting: 15.4   Smokeless tobacco: Never  Vaping Use   Vaping Use: Never used  Substance Use Topics   Alcohol use: Yes    Comment: Occassionally   Drug use: No       OPHTHALMIC EXAM:  Base Eye Exam     Visual Acuity (Snellen -  Linear)       Right Left   Dist cc 20/20 -1 20/20 -1    Correction: Glasses         Tonometry (Tonopen, 1:09 PM)       Right Left   Pressure 14 15         Pupils       Pupils Dark Light Shape React APD   Right PERRL 3 2 Round Brisk None   Left PERRL 3 2 Round Brisk None         Visual Fields       Left Right    Full Full         Extraocular Movement       Right Left    Full, Ortho Full, Ortho         Neuro/Psych     Oriented x3: Yes   Mood/Affect: Normal         Dilation     Both eyes: 2.5% Phenylephrine @ 1:09 PM           Slit Lamp and Fundus Exam     External Exam       Right Left   External Normal Normal         Slit Lamp Exam       Right Left   Lids/Lashes Dermatochalasis - upper lid, Meibomian gland dysfunction Dermatochalasis - upper lid, Meibomian gland dysfunction   Conjunctiva/Sclera White and quiet White  and quiet   Cornea Well healed temporal cataract wound, mild EBMD Well healed temporal cataract wound, mild EBMD, mild arcus   Anterior Chamber Deep and clear Deep and quiet   Iris Round and dilated Round and dilated   Lens PC IOL in good position PC IOL in good position, trace PCO   Anterior Vitreous Vitreous syneresis, fine pigment, Posterior vitreous detachment, persistent condensations Vitreous syneresis, trace pigment, Posterior vitreous detachment, Weiss ring, vitreous condensations         Fundus Exam       Right Left   Disc Pink and sharp, Tilted disc, +PPA/PPP mild Pallor, 360 PPA   C/D Ratio 0.2 0.1   Macula Flat, Blunted foveal reflex, RPE mottling and clumping, mild ERM, No heme or edema Flat, Good foveal reflex, RPE mottling and clumping   Vessels Attenuated, mild tortuosity Attenuated, Tortuous   Periphery Attached, Peripheral cystoid degeneration inferiorly, retinal tear at 1200, 2 retinal tears at 0130, +SRF from 1130-0200 (focal RD); shallow pocket of peripheral SRF from 0930 to 1100, good laser barricade surrounding focal RD's, Small retinal tear at 1030 ora with good laser surrounding, mild encroachment of SRF posteriorly through 1-2 rows of laser at 0130 position, no new RT/RD Attached, Pigmented laser changes 0130 and 0800, mild pigment cystoid degeneration inferiorly; Mild patches of lattice at 0430 and 0600, pigmented CR scar at 730 - no holes, no new RT/RD           IMAGING AND PROCEDURES  Imaging and Procedures for 12/12/2022  OCT, Retina - OU - Both Eyes       Right Eye Quality was good. Central Foveal Thickness: 300. Progression has improved. Findings include normal foveal contour, no IRF, myopic contour, epiretinal membrane, intraretinal fluid, macular pucker, subretinal fluid (Mild ERM, stable improvement vitreous opacities; trace cystic changes sup fovea -- improved, pockets of peripheral SRF ST/SN periphery caught on wide field -- not imaged today).    Left Eye Quality was good. Central Foveal Thickness: 270. Progression has been stable.  Findings include normal foveal contour, no IRF, no SRF, myopic contour.   Notes *Images captured and stored on drive  Diagnosis / Impression:  OD: Mild ERM, stable improvement vitreous opacities; trace cystic changes sup fovea -- improved, pockets of peripheral SRF ST/SN periphery caught on wide field-- not imaged today OS: NFP; no IRF/SRF   Clinical management:  See below  Abbreviations: NFP - Normal foveal profile. CME - cystoid macular edema. PED - pigment epithelial detachment. IRF - intraretinal fluid. SRF - subretinal fluid. EZ - ellipsoid zone. ERM - epiretinal membrane. ORA - outer retinal atrophy. ORT - outer retinal tubulation. SRHM - subretinal hyper-reflective material. IRHM - intraretinal hyper-reflective material            ASSESSMENT/PLAN:    ICD-10-CM   1. Epiretinal membrane (ERM) of right eye  H35.371 OCT, Retina - OU - Both Eyes    2. Posterior vitreous detachment of both eyes  H43.813     3. Vitreous hemorrhage of right eye ([place])  H43.11     4. Right retinal detachment  H33.21     5. Retinal tear of right eye  H33.311     6. History of repair of retinal tear by laser photocoagulation  Z98.890     7. Lattice degeneration of left retina  H35.412     8. Right retinoschisis  H33.101     9. Pseudophakia  Z96.1      Epiretinal membrane, right eye  - ERM w/ mild pucker - BCVA 20/20 - asymptomatic, no metamorphopsia - pt reports significant floaters / debris OD - recommend ERM removal at the same time as RD repair below -- 25g PPV w/ ERM peel and SF6 gas - surgery scheduled for next Thursday, Mar 21 - f/u Friday 3.22.24 for POV1  2,3. Hemorrhagic PVD OD, PVD OS  - acute symptomatic flashes/red floaters OD, onset 06.15.23 -- pt reports improved floaters and FOL  - OS w/ old PVD w/ retinal tears in 2012 -- s/p laser retinopexy OS by JDM -- good laser in place  -  VH improved but significant old condensations persist  - discussed likely improvement of visually significant vitreous condensations / opacities with PPV OD  4,5. Retinal tears w/ +SRF/focal peripheral retinal detachment, OD.   - horseshoe tears located 0130 (2), 1200 and 1030 - focal peripheral RDs from 1130-0200 and 0930-1100 - s/p laser retinopexy OD (07.14.23), (08.02.23) -- good barricade laser in place - BCVA 20/20 OD -- stable - OCT OD: Mild ERM, stable improvement vitreous opacities; trace cystic changes sup fovea -- improved, pockets of peripheral SRF ST/SN periphery caught on wide field-- not imaged today - pt continues to experience various forms of photopsias -- broad vertical beams of light OD most recently - subretinal fluid appears to have progressed posteriorly through 1-2 rows of laser OD at 0130 position - recommend PPV next Thursday, 03.21.24 for definitive repair of focal RDs OD -- 25g PPV w/ endolaser and gas (SF6) OD under general aneswthesia - pt wishes to proceed with surgery OD - RBA of procedure discussed, questions answered - informed consent obtained and signed - case scheduled for next Thursday, 03.21.24 - f/u March 22 for POV1  6. History of RT OS  - history of PVD w/ retinal tears in 2012 - s/p laser retinopexy OS by JDM -- good laser in place  - no new RT/RD  - continue to monitor  7. Lattice degeneration OS - focal patches of pigmented lattice inferiorly (0430  and 0600) - discussed findings, prognosis, and treatment options including observation - recommend observation for now, but discussed possible prophylactic retinopexy in the future - monitor   8. Retinoschisis - myopic retionschisis noted on OCT -- temporal macula and nasal midzone  9. Pseudophakia OU  - s/p CE/IOL OU, Jan 2023 by Dr. Alanda Slim  - IOLs in good position, doing well  - continue to monitor  Ophthalmic Meds Ordered this visit:  No orders of the defined types were placed in this  encounter.    Return in about 11 days (around 12/23/2022) for f/u POV 1, DFE.  There are no Patient Instructions on file for this visit.  Explained the diagnoses, plan, and follow up with the patient and they expressed understanding.  Patient expressed understanding of the importance of proper follow up care.   This document serves as a record of services personally performed by Gardiner Sleeper, MD, PhD. It was created on their behalf by Orvan Falconer, an ophthalmic technician. The creation of this record is the provider's dictation and/or activities during the visit.    Electronically signed by: Orvan Falconer, OA, 12/12/22  9:55 PM  This document serves as a record of services personally performed by Gardiner Sleeper, MD, PhD. It was created on their behalf by San Jetty. Owens Shark, OA an ophthalmic technician. The creation of this record is the provider's dictation and/or activities during the visit.    Electronically signed by: San Jetty. Marguerita Merles 03.11.2024 9:55 PM   Gardiner Sleeper, M.D., Ph.D. Diseases & Surgery of the Retina and Vitreous [facility]  I have reviewed the above documentation for accuracy and completeness, and I agree with the above. Gardiner Sleeper, M.D., Ph.D. 12/12/22 10:03 PM   Abbreviations: M myopia (nearsighted); A astigmatism; H hyperopia (farsighted); P presbyopia; Mrx spectacle prescription;  CTL contact lenses; OD right eye; OS left eye; OU both eyes  XT exotropia; ET esotropia; PEK punctate epithelial keratitis; PEE punctate epithelial erosions; DES dry eye syndrome; MGD meibomian gland dysfunction; ATs artificial tears; PFAT's preservative free artificial tears; [place] nuclear sclerotic cataract; PSC posterior subcapsular cataract; ERM epi-retinal membrane; PVD posterior vitreous detachment; RD retinal detachment; DM diabetes mellitus; DR diabetic retinopathy; NPDR non-proliferative diabetic retinopathy; PDR proliferative diabetic  retinopathy; CSME clinically significant macular edema; DME diabetic macular edema; dbh dot blot hemorrhages; CWS cotton wool spot; POAG primary open angle glaucoma; C/D cup-to-disc ratio; HVF humphrey visual field; GVF goldmann visual field; OCT optical coherence tomography; IOP intraocular pressure; BRVO Branch retinal vein occlusion; CRVO central retinal vein occlusion; CRAO central retinal artery occlusion; BRAO branch retinal artery occlusion; RT retinal tear; SB scleral buckle; PPV pars plana vitrectomy; VH Vitreous hemorrhage; PRP panretinal laser photocoagulation; IVK intravitreal kenalog; VMT vitreomacular traction; MH Macular hole;  NVD neovascularization of the disc; NVE neovascularization elsewhere; AREDS age related eye disease study; ARMD age related macular degeneration; POAG primary open angle glaucoma; EBMD epithelial/anterior basement membrane dystrophy; ACIOL anterior chamber intraocular lens; IOL intraocular lens; PCIOL posterior chamber intraocular lens; Phaco/IOL phacoemulsification with intraocular lens placement; [place] photorefractive keratectomy; LASIK laser assisted in situ keratomileusis; HTN hypertension; DM diabetes mellitus; COPD chronic obstructive pulmonary disease

## 2022-12-12 ENCOUNTER — Encounter (INDEPENDENT_AMBULATORY_CARE_PROVIDER_SITE_OTHER): Payer: Self-pay | Admitting: Ophthalmology

## 2022-12-12 ENCOUNTER — Ambulatory Visit (INDEPENDENT_AMBULATORY_CARE_PROVIDER_SITE_OTHER): Payer: 59 | Admitting: Ophthalmology

## 2022-12-12 DIAGNOSIS — H35371 Puckering of macula, right eye: Secondary | ICD-10-CM

## 2022-12-12 DIAGNOSIS — H4311 Vitreous hemorrhage, right eye: Secondary | ICD-10-CM

## 2022-12-12 DIAGNOSIS — H43813 Vitreous degeneration, bilateral: Secondary | ICD-10-CM | POA: Diagnosis not present

## 2022-12-12 DIAGNOSIS — H33101 Unspecified retinoschisis, right eye: Secondary | ICD-10-CM

## 2022-12-12 DIAGNOSIS — H3321 Serous retinal detachment, right eye: Secondary | ICD-10-CM

## 2022-12-12 DIAGNOSIS — Z961 Presence of intraocular lens: Secondary | ICD-10-CM

## 2022-12-12 DIAGNOSIS — H33311 Horseshoe tear of retina without detachment, right eye: Secondary | ICD-10-CM

## 2022-12-12 DIAGNOSIS — H35412 Lattice degeneration of retina, left eye: Secondary | ICD-10-CM

## 2022-12-12 DIAGNOSIS — Z9889 Other specified postprocedural states: Secondary | ICD-10-CM

## 2022-12-14 ENCOUNTER — Telehealth: Payer: Self-pay

## 2022-12-14 NOTE — Telephone Encounter (Signed)
Received surgical clearance form from [facility]. Pt is needing [place] Vitrectomy with Dr. Coralyn Pear, he is scheduled for 12/22/22 under general anesthesia. Pt's last EKG was in 2015. Mychart message sent to Pt to call office and schedule appt ASAP.

## 2022-12-14 NOTE — Telephone Encounter (Signed)
Spoke w/ Pt- appt scheduled w/ Dr. Larose Kells on 12/16/22.

## 2022-12-16 ENCOUNTER — Ambulatory Visit (INDEPENDENT_AMBULATORY_CARE_PROVIDER_SITE_OTHER): Payer: 59 | Admitting: Internal Medicine

## 2022-12-16 VITALS — BP 122/68 | HR 60 | Temp 97.8°F | Resp 16 | Ht 71.0 in | Wt 160.5 lb

## 2022-12-16 DIAGNOSIS — Z01818 Encounter for other preprocedural examination: Secondary | ICD-10-CM | POA: Diagnosis not present

## 2022-12-16 DIAGNOSIS — R739 Hyperglycemia, unspecified: Secondary | ICD-10-CM | POA: Diagnosis not present

## 2022-12-16 NOTE — Progress Notes (Unsigned)
   Subjective:    Patient ID: Bradley Meyer, male    DOB: 1956-09-12, 67 y.o.   MRN: [id number]  DOS:  12/16/2022 Type of visit - description: Clearance  In general doing well but needs surgery as recommended by the retina specialist. He is very active, plays tennis, goes to the gym. Denies chest pain or difficulty breathing. No palpitation.  No syncope  Review of Systems See above   Past Medical History:  Diagnosis Date   BCC (basal cell carcinoma of skin)    nose 2006   Colon polyp    Kidney stones    Palpitation     2010, saw [facility] Cardiology (w/u per patient negative)     Vitreous detachment 05-2010    Past Surgical History:  Procedure Laterality Date   APPENDECTOMY  1971   CATARACT EXTRACTION Bilateral 10/2021   Dr. Alanda Slim   KNEE ARTHROSCOPY  2002   right   NASAL SEPTUM SURGERY  05/2018   POLYPECTOMY      No current outpatient medications     Objective:   Physical Exam BP 122/68   Pulse 60   Temp 97.8 F (36.6 C) (Oral)   Resp 16   Ht 5\' 11"  (1.803 m)   Wt 160 lb 8 oz (72.8 kg)   SpO2 98%   BMI 22.39 kg/m  General:   Well developed, NAD, BMI noted. HEENT:  Normocephalic . Face symmetric, atraumatic Lungs:  CTA B Normal respiratory effort, no intercostal retractions, no accessory muscle use. Heart: RRR,  no murmur.  Lower extremities: no pretibial edema bilaterally  Skin: Not pale. Not jaundice Neurologic:  alert & oriented X3.  Speech normal, gait appropriate for age and unassisted Psych--  Cognition and judgment appear intact.  Cooperative with normal attention span and concentration.  Behavior appropriate. No anxious or depressed appearing.      Assessment    Assessment H/o Anxiety H/o Palpitations 2010, saw cards, workup (-) per pt BCC nose 2016, sees derm q year L Vitreous detachment 2011 R vitreous  detachment and retinal tear 04-2022  Chronic abdominal pain: 2017- saw GI, EGD, colonoscopy, biopsies, CT unrevealing. Sx resolved  with gluten-free diet and other dietary changes Vegetarian  Liver cysts-- see OV 07-28-2021  PLAN: Surgical clearance: Clearance request from ophthalmology reviewed. Coronary calcium score last year was 0. He is active with no physical limitations. EKG: Bradycardia.  No acute changes. Plan: BMP, CBC.  Cleared for surgery pending labs. Mild hyperglycemia: Check A1c RTC as scheduled for April.  Okay to postpone the visit if needed

## 2022-12-16 NOTE — Patient Instructions (Addendum)
It was good to see you today.     GO TO THE LAB : Get the blood work    See you in April

## 2022-12-17 LAB — CBC WITH DIFFERENTIAL/PLATELET
Absolute Monocytes: 776 cells/uL (ref 200–950)
Basophils Absolute: 68 cells/uL (ref 0–200)
Basophils Relative: 0.7 %
Eosinophils Absolute: 58 cells/uL (ref 15–500)
Eosinophils Relative: 0.6 %
HCT: 40.1 % (ref 38.5–50.0)
Hemoglobin: 13.8 g/dL (ref 13.2–17.1)
Lymphs Abs: 1969 cells/uL (ref 850–3900)
MCH: 30.9 pg (ref 27.0–33.0)
MCHC: 34.4 g/dL (ref 32.0–36.0)
MCV: 89.9 fL (ref 80.0–100.0)
MPV: 11.7 fL (ref 7.5–12.5)
Monocytes Relative: 8 %
Neutro Abs: 6829 cells/uL (ref 1500–7800)
Neutrophils Relative %: 70.4 %
Platelets: 235 10*3/uL (ref 140–400)
RBC: 4.46 10*6/uL (ref 4.20–5.80)
RDW: 11.9 % (ref 11.0–15.0)
Total Lymphocyte: 20.3 %
WBC: 9.7 10*3/uL (ref 3.8–10.8)

## 2022-12-17 LAB — BASIC METABOLIC PANEL
BUN/Creatinine Ratio: 13 (calc) (ref 6–22)
BUN: 18 mg/dL (ref 7–25)
CO2: 27 mmol/L (ref 20–32)
Calcium: 9 mg/dL (ref 8.6–10.3)
Chloride: 105 mmol/L (ref 98–110)
Creat: 1.39 mg/dL — ABNORMAL HIGH (ref 0.70–1.35)
Glucose, Bld: 89 mg/dL (ref 65–99)
Potassium: 4.3 mmol/L (ref 3.5–5.3)
Sodium: 141 mmol/L (ref 135–146)

## 2022-12-17 LAB — HEMOGLOBIN A1C
Hgb A1c MFr Bld: 5.5 % of total Hgb (ref <5.7)
Mean Plasma Glucose: 111 mg/dL
eAG (mmol/L): 6.2 mmol/L

## 2022-12-18 NOTE — Assessment & Plan Note (Signed)
Surgical clearance: Clearance request from ophthalmology reviewed. Coronary calcium score last year was 0. He is active with no physical limitations. EKG: Bradycardia.  No acute changes. Plan: BMP, CBC.  Cleared for surgery pending labs. Mild hyperglycemia: Check A1c RTC as scheduled for April.  Okay to postpone the visit if needed

## 2022-12-19 NOTE — H&P (Signed)
Bradley Meyer is an 67 y.o. male.    Chief Complaint: retinal detachment with ERM, RIGHT EYE  HPI: Pt with history of retinal tears and focal peripheral retinal detachment OD, s/p laser retinopexy OD in July and Aug 2023. Pt has developed visually significant ERM and vitreous opacities OD and has also had some progression of his retinal detachments OD. After a discussion of the risks benefits and alternatives to surgery, the patient has elected to proceed with surgery to definitely repair the detachment, and remove the ERM and vitreous opacities: 25g PPV w/ membrane peel, endolaser, and gas OD, under general anesthesia.  Past Medical History:  Diagnosis Date   BCC (basal cell carcinoma of skin)    nose 2006   Colon polyp    Kidney stones    Palpitation     2010, saw [facility] Cardiology (w/u per patient negative)     Vitreous detachment 05-2010    Past Surgical History:  Procedure Laterality Date   APPENDECTOMY  1971   CATARACT EXTRACTION Bilateral 10/2021   Dr. Alanda Slim   KNEE ARTHROSCOPY  2002   right   NASAL SEPTUM SURGERY  05/2018   POLYPECTOMY      Family History  Problem Relation Age of Onset   Prostate cancer Father 37   Bladder Cancer Father    CAD Other        uncle, in his 28s   Colon polyps Mother    Colon cancer Neg Hx    Stomach cancer Neg Hx    Diabetes Neg Hx    Kidney disease Neg Hx    Gallbladder disease Neg Hx    Esophageal cancer Neg Hx    Rectal cancer Neg Hx    Social History:  reports that he quit smoking about 15 years ago. His smoking use included cigarettes. He has a 1.20 pack-year smoking history. He has never used smokeless tobacco. He reports current alcohol use. He reports that he does not use drugs.  Allergies: No Known Allergies  No medications prior to admission.    Review of systems otherwise negative  There were no vitals taken for this visit.  Physical exam: Mental status: oriented x3. Eyes: See eye exam associated with this  date of surgery Ears, Nose, Throat: within normal limits Neck: Within Normal limits General: within normal limits Chest: Within normal limits Breast: deferred Heart: Within normal limits Abdomen: Within normal limits GU: deferred Extremities: within normal limits Skin: within normal limits  Assessment/Plan Retinal detachment with ERM, RIGHT EYE  - s/p laser retinopexy OD x2 -- 07.14.23 and 08.02.23  Plan: To [facility] for 25g PPV w/ membrane peel, endolaser, and gas, OD, under general anesthesia  - case scheduled for Thursday, 3.21.24, 1130 am -- [facility] OR 08  Gardiner Sleeper, M.D., Ph.D. Vitreoretinal Surgeon [facility]

## 2022-12-19 NOTE — Telephone Encounter (Signed)
Received fax confirmation

## 2022-12-19 NOTE — Telephone Encounter (Signed)
Pt cleared for surgery. Form completed and faxed back to [facility] at [phone] w/ OV note, EKG and labs from 12/16/22. Form sent for scanning.

## 2022-12-20 ENCOUNTER — Encounter (HOSPITAL_COMMUNITY): Payer: Self-pay | Admitting: Ophthalmology

## 2022-12-21 ENCOUNTER — Encounter (HOSPITAL_COMMUNITY): Payer: Self-pay | Admitting: Ophthalmology

## 2022-12-21 NOTE — Progress Notes (Signed)
PCP - Dr Kathlene November Cardiologist - n/a  Chest x-ray - n/a EKG - 12/16/22 Stress Test - 12/2006 ECHO - 12/2006 Cardiac Cath - n/a  ICD Pacemaker/Loop - n/a  Sleep Study -  n/a CPAP - none  Diabetes - n/a  Blood Thinner Instructions:  n/a  Aspirin Instructions: n/a  NPO  Anesthesia review: No  STOP now taking any Aspirin (unless otherwise instructed by your surgeon), Aleve, Naproxen, Ibuprofen, Motrin, Advil, Goody's, BC's, all herbal medications, fish oil, and all vitamins.   Coronavirus Screening Do you have any of the following symptoms:  Cough yes/no: No Fever (>100.6F)  yes/no: No Runny nose yes/no: No Sore throat yes/no: No Difficulty breathing/shortness of breath  yes/no: No  Have you traveled in the last 14 days and where? yes/no: No  Patient verbalized understanding of instructions that were given via phone.

## 2022-12-22 ENCOUNTER — Other Ambulatory Visit: Payer: Self-pay

## 2022-12-22 ENCOUNTER — Encounter (HOSPITAL_COMMUNITY): Payer: Self-pay | Admitting: Ophthalmology

## 2022-12-22 ENCOUNTER — Ambulatory Visit (HOSPITAL_COMMUNITY)
Admission: RE | Admit: 2022-12-22 | Discharge: 2022-12-22 | Disposition: A | Discharge status: Home / Self Care | Payer: 59 | Attending: Ophthalmology | Admitting: Ophthalmology

## 2022-12-22 ENCOUNTER — Ambulatory Visit (HOSPITAL_BASED_OUTPATIENT_CLINIC_OR_DEPARTMENT_OTHER): Payer: 59 | Admitting: Certified Registered Nurse Anesthetist

## 2022-12-22 ENCOUNTER — Ambulatory Visit (HOSPITAL_COMMUNITY): Payer: 59 | Admitting: Certified Registered Nurse Anesthetist

## 2022-12-22 ENCOUNTER — Encounter (HOSPITAL_COMMUNITY)
Admission: RE | Disposition: A | Discharge status: Home / Self Care | Payer: Self-pay | Source: Home / Self Care | Attending: Ophthalmology

## 2022-12-22 DIAGNOSIS — Z87891 Personal history of nicotine dependence: Secondary | ICD-10-CM | POA: Insufficient documentation

## 2022-12-22 DIAGNOSIS — H35371 Puckering of macula, right eye: Secondary | ICD-10-CM | POA: Diagnosis not present

## 2022-12-22 DIAGNOSIS — H3321 Serous retinal detachment, right eye: Secondary | ICD-10-CM | POA: Insufficient documentation

## 2022-12-22 HISTORY — PX: MEMBRANE PEEL: SHX5967

## 2022-12-22 HISTORY — PX: PHOTOCOAGULATION WITH LASER: SHX6027

## 2022-12-22 HISTORY — DX: Gastro-esophageal reflux disease without esophagitis: K21.9

## 2022-12-22 HISTORY — DX: Personal history of urinary calculi: Z87.442

## 2022-12-22 HISTORY — PX: PARS PLANA VITRECTOMY: SHX2166

## 2022-12-22 HISTORY — PX: GAS INSERTION: SHX5336

## 2022-12-22 SURGERY — PARS PLANA VITRECTOMY WITH 25 GAUGE
Anesthesia: General | Site: Eye | Laterality: Right

## 2022-12-22 MED ORDER — ONDANSETRON HCL 4 MG/2ML IJ SOLN
INTRAMUSCULAR | Status: DC | PRN
  Administered 2022-12-22: 4 mg via INTRAVENOUS

## 2022-12-22 MED ORDER — FENTANYL CITRATE (PF) 250 MCG/5ML IJ SOLN
INTRAMUSCULAR | Status: DC | PRN
  Administered 2022-12-22: 100 ug via INTRAVENOUS

## 2022-12-22 MED ORDER — FENTANYL CITRATE (PF) 250 MCG/5ML IJ SOLN
INTRAMUSCULAR | Status: AC
  Filled 2022-12-22: qty 5

## 2022-12-22 MED ORDER — TRIAMCINOLONE ACETONIDE 40 MG/ML IJ SUSP
INTRAMUSCULAR | Status: DC | PRN
  Administered 2022-12-22: .5 mL

## 2022-12-22 MED ORDER — DEXAMETHASONE SODIUM PHOSPHATE 10 MG/ML IJ SOLN
INTRAMUSCULAR | Status: AC
  Filled 2022-12-22: qty 1

## 2022-12-22 MED ORDER — ATROPINE SULFATE 1 % OP SOLN
OPHTHALMIC | Status: DC | PRN
  Administered 2022-12-22: 1 [drp] via OPHTHALMIC

## 2022-12-22 MED ORDER — BACITRACIN-POLYMYXIN B 500-10000 UNIT/GM OP OINT
TOPICAL_OINTMENT | OPHTHALMIC | Status: AC
  Filled 2022-12-22: qty 3.5

## 2022-12-22 MED ORDER — OXYCODONE HCL 5 MG PO TABS: 5.0000 mg | ORAL_TABLET | Freq: Once | ORAL | Status: DC | PRN

## 2022-12-22 MED ORDER — ORAL CARE MOUTH RINSE: 15.0000 mL | Freq: Once | OROMUCOSAL | Status: AC

## 2022-12-22 MED ORDER — CHLORHEXIDINE GLUCONATE 0.12 % MT SOLN: 15.0000 mL | Freq: Once | OROMUCOSAL | Status: AC

## 2022-12-22 MED ORDER — SODIUM CHLORIDE 0.9 % IV SOLN: INTRAVENOUS | Status: DC

## 2022-12-22 MED ORDER — PHENYLEPHRINE HCL 10 % OP SOLN
1.0000 [drp] | OPHTHALMIC | Status: AC | PRN
  Administered 2022-12-22 (×3): 1 [drp] via OPHTHALMIC
  Filled 2022-12-22: qty 5

## 2022-12-22 MED ORDER — BSS IO SOLN
INTRAOCULAR | Status: DC | PRN
  Administered 2022-12-22: 15 mL

## 2022-12-22 MED ORDER — LIDOCAINE HCL (PF) 4 % IJ SOLN
INTRAMUSCULAR | Status: AC
  Filled 2022-12-22: qty 5

## 2022-12-22 MED ORDER — STERILE WATER FOR INJECTION IJ SOLN
INTRAMUSCULAR | Status: DC | PRN
  Administered 2022-12-22: 500 mL

## 2022-12-22 MED ORDER — SODIUM CHLORIDE (PF) 0.9 % IJ SOLN
INTRAMUSCULAR | Status: AC
  Filled 2022-12-22: qty 10

## 2022-12-22 MED ORDER — FENTANYL CITRATE (PF) 100 MCG/2ML IJ SOLN
INTRAMUSCULAR | Status: AC
  Filled 2022-12-22: qty 2

## 2022-12-22 MED ORDER — NEOSTIGMINE METHYLSULFATE 3 MG/3ML IV SOSY
PREFILLED_SYRINGE | INTRAVENOUS | Status: AC
  Filled 2022-12-22: qty 3

## 2022-12-22 MED ORDER — OXYCODONE HCL 5 MG/5ML PO SOLN: 5.0000 mg | Freq: Once | ORAL | Status: DC | PRN

## 2022-12-22 MED ORDER — GLYCOPYRROLATE 0.2 MG/ML IJ SOLN
INTRAMUSCULAR | Status: DC | PRN
  Administered 2022-12-22: .2 mg via INTRAVENOUS

## 2022-12-22 MED ORDER — LIDOCAINE HCL (PF) 2 % IJ SOLN
INTRAMUSCULAR | Status: AC
  Filled 2022-12-22: qty 5

## 2022-12-22 MED ORDER — CHLORHEXIDINE GLUCONATE 0.12 % MT SOLN
OROMUCOSAL | Status: AC
  Administered 2022-12-22: 15 mL via OROMUCOSAL
  Filled 2022-12-22: qty 15

## 2022-12-22 MED ORDER — EPINEPHRINE PF 1 MG/ML IJ SOLN
INTRAMUSCULAR | Status: DC | PRN
  Administered 2022-12-22: .3 mL

## 2022-12-22 MED ORDER — BRILLIANT BLUE G 0.025 % IO SOSY
PREFILLED_SYRINGE | INTRAOCULAR | Status: DC | PRN
  Administered 2022-12-22: .5 mL via INTRAVITREAL

## 2022-12-22 MED ORDER — PREDNISOLONE ACETATE 1 % OP SUSP
OPHTHALMIC | Status: DC | PRN
  Administered 2022-12-22: 1 [drp] via OPHTHALMIC

## 2022-12-22 MED ORDER — CEFTAZIDIME 1 G IJ SOLR
INTRAMUSCULAR | Status: AC
  Filled 2022-12-22: qty 1

## 2022-12-22 MED ORDER — NEOSTIGMINE METHYLSULFATE 10 MG/10ML IV SOLN
INTRAVENOUS | Status: DC | PRN
  Administered 2022-12-22: 1 mg via INTRAVENOUS

## 2022-12-22 MED ORDER — PREDNISOLONE ACETATE 1 % OP SUSP
OPHTHALMIC | Status: AC
  Filled 2022-12-22: qty 5

## 2022-12-22 MED ORDER — BRIMONIDINE TARTRATE 0.2 % OP SOLN
OPHTHALMIC | Status: AC
  Filled 2022-12-22: qty 5

## 2022-12-22 MED ORDER — EPINEPHRINE PF 1 MG/ML IJ SOLN
INTRAOCULAR | Status: DC | PRN
  Administered 2022-12-22: 500 mL

## 2022-12-22 MED ORDER — BSS PLUS IO SOLN
INTRAOCULAR | Status: AC
  Filled 2022-12-22: qty 500

## 2022-12-22 MED ORDER — GATIFLOXACIN 0.5 % OP SOLN
OPHTHALMIC | Status: AC
  Filled 2022-12-22: qty 2.5

## 2022-12-22 MED ORDER — FENTANYL CITRATE (PF) 100 MCG/2ML IJ SOLN
25.0000 ug | INTRAMUSCULAR | Status: DC | PRN
  Administered 2022-12-22 (×2): 50 ug via INTRAVENOUS

## 2022-12-22 MED ORDER — ACETAZOLAMIDE SODIUM 500 MG IJ SOLR
INTRAMUSCULAR | Status: AC
  Filled 2022-12-22: qty 500

## 2022-12-22 MED ORDER — PROPOFOL 10 MG/ML IV BOLUS
INTRAVENOUS | Status: DC | PRN
  Administered 2022-12-22: 120 mg via INTRAVENOUS

## 2022-12-22 MED ORDER — NA CHONDROIT SULF-NA HYALURON 40-30 MG/ML IO SOSY
INTRAOCULAR | Status: DC | PRN
  Administered 2022-12-22: .5 mL via INTRAOCULAR

## 2022-12-22 MED ORDER — PHENYLEPHRINE HCL-NACL 20-0.9 MG/250ML-% IV SOLN
INTRAVENOUS | Status: DC | PRN
  Administered 2022-12-22: 10 ug/min via INTRAVENOUS

## 2022-12-22 MED ORDER — ATROPINE SULFATE 1 % OP SOLN
1.0000 [drp] | OPHTHALMIC | Status: AC | PRN
  Administered 2022-12-22 (×3): 1 [drp] via OPHTHALMIC
  Filled 2022-12-22: qty 2

## 2022-12-22 MED ORDER — STERILE WATER FOR INJECTION IJ SOLN
INTRAMUSCULAR | Status: AC
  Filled 2022-12-22: qty 10

## 2022-12-22 MED ORDER — BRIMONIDINE TARTRATE 0.2 % OP SOLN
OPHTHALMIC | Status: DC | PRN
  Administered 2022-12-22: 1 [drp] via OPHTHALMIC

## 2022-12-22 MED ORDER — TROPICAMIDE 1 % OP SOLN
1.0000 [drp] | OPHTHALMIC | Status: AC | PRN
  Administered 2022-12-22 (×3): 1 [drp] via OPHTHALMIC
  Filled 2022-12-22: qty 15

## 2022-12-22 MED ORDER — CARBACHOL 0.01 % IO SOLN
INTRAOCULAR | Status: AC
  Filled 2022-12-22: qty 1.5

## 2022-12-22 MED ORDER — BSS IO SOLN
INTRAOCULAR | Status: AC
  Filled 2022-12-22: qty 15

## 2022-12-22 MED ORDER — POLYMYXIN B SULFATE 500000 UNITS IJ SOLR
INTRAMUSCULAR | Status: AC
  Filled 2022-12-22: qty 10

## 2022-12-22 MED ORDER — NA CHONDROIT SULF-NA HYALURON 40-30 MG/ML IO SOSY
INTRAOCULAR | Status: AC
  Filled 2022-12-22: qty 1

## 2022-12-22 MED ORDER — BACITRACIN-POLYMYXIN B 500-10000 UNIT/GM OP OINT
TOPICAL_OINTMENT | OPHTHALMIC | Status: DC | PRN
  Administered 2022-12-22: 1 via OPHTHALMIC

## 2022-12-22 MED ORDER — DEXAMETHASONE SODIUM PHOSPHATE 10 MG/ML IJ SOLN
INTRAMUSCULAR | Status: DC | PRN
  Administered 2022-12-22: 10 mg via INTRAVENOUS

## 2022-12-22 MED ORDER — TRIAMCINOLONE ACETONIDE 40 MG/ML IJ SUSP
INTRAMUSCULAR | Status: AC
  Filled 2022-12-22: qty 5

## 2022-12-22 MED ORDER — TOBRAMYCIN-DEXAMETHASONE 0.3-0.1 % OP OINT
TOPICAL_OINTMENT | OPHTHALMIC | Status: AC
  Filled 2022-12-22: qty 3.5

## 2022-12-22 MED ORDER — ONDANSETRON HCL 4 MG/2ML IJ SOLN: 4.0000 mg | Freq: Four times a day (QID) | INTRAMUSCULAR | Status: DC | PRN

## 2022-12-22 MED ORDER — BUPIVACAINE HCL (PF) 0.75 % IJ SOLN
INTRAMUSCULAR | Status: AC
  Filled 2022-12-22: qty 10

## 2022-12-22 MED ORDER — EPINEPHRINE PF 1 MG/ML IJ SOLN
INTRAMUSCULAR | Status: AC
  Filled 2022-12-22: qty 1

## 2022-12-22 MED ORDER — GLYCOPYRROLATE PF 0.2 MG/ML IJ SOSY
PREFILLED_SYRINGE | INTRAMUSCULAR | Status: AC
  Filled 2022-12-22: qty 1

## 2022-12-22 MED ORDER — GATIFLOXACIN 0.5 % OP SOLN OPTIME - NO CHARGE
OPHTHALMIC | Status: DC | PRN
  Administered 2022-12-22: 1 [drp] via OPHTHALMIC

## 2022-12-22 MED ORDER — PROPARACAINE HCL 0.5 % OP SOLN
1.0000 [drp] | OPHTHALMIC | Status: AC | PRN
  Administered 2022-12-22 (×3): 1 [drp] via OPHTHALMIC
  Filled 2022-12-22: qty 15

## 2022-12-22 MED ORDER — LIDOCAINE 2% (20 MG/ML) 5 ML SYRINGE
INTRAMUSCULAR | Status: DC | PRN
  Administered 2022-12-22: 60 mg via INTRAVENOUS

## 2022-12-22 MED ORDER — DORZOLAMIDE HCL-TIMOLOL MAL 2-0.5 % OP SOLN
OPHTHALMIC | Status: DC | PRN
  Administered 2022-12-22: 1 [drp] via OPHTHALMIC

## 2022-12-22 MED ORDER — ATROPINE SULFATE 1 % OP SOLN
OPHTHALMIC | Status: AC
  Filled 2022-12-22: qty 5

## 2022-12-22 MED ORDER — STERILE WATER FOR INJECTION IJ SOLN
INTRAMUSCULAR | Status: DC | PRN
  Administered 2022-12-22: .5 mL

## 2022-12-22 MED ORDER — DORZOLAMIDE HCL-TIMOLOL MAL 2-0.5 % OP SOLN
OPHTHALMIC | Status: AC
  Filled 2022-12-22: qty 10

## 2022-12-22 MED ORDER — ROCURONIUM BROMIDE 10 MG/ML (PF) SYRINGE
PREFILLED_SYRINGE | INTRAVENOUS | Status: DC | PRN
  Administered 2022-12-22: 50 mg via INTRAVENOUS

## 2022-12-22 SURGICAL SUPPLY — 39 items
APL SWBSTK 6 STRL LF DISP (MISCELLANEOUS) ×8
APPLICATOR COTTON TIP 6 STRL (MISCELLANEOUS) ×8 IMPLANT
APPLICATOR COTTON TIP 6IN STRL (MISCELLANEOUS) ×8
BAND WRIST GAS GREEN (MISCELLANEOUS) IMPLANT
BNDG EYE OVAL 2 1/8 X 2 5/8 (GAUZE/BANDAGES/DRESSINGS) ×2 IMPLANT
CABLE BIPOLOR RESECTION CORD (MISCELLANEOUS) ×2 IMPLANT
CANNULA FLEX TIP 25G (CANNULA) ×2 IMPLANT
CLSR STERI-STRIP ANTIMIC 1/2X4 (GAUZE/BANDAGES/DRESSINGS) ×2 IMPLANT
DRAPE INCISE 51X51 W/FILM STRL (DRAPES) ×2 IMPLANT
DRAPE MICROSCOPE LEICA 46X105 (MISCELLANEOUS) ×2 IMPLANT
DRAPE OPHTHALMIC 77X100 STRL (CUSTOM PROCEDURE TRAY) ×2 IMPLANT
FORCEPS GRIESHABER ILM 25G A (INSTRUMENTS) IMPLANT
GAS AUTO FILL CONSTEL (OPHTHALMIC) ×2
GAS AUTO FILL CONSTELLATION (OPHTHALMIC) IMPLANT
GAS WRIST BAND GREEN (MISCELLANEOUS) ×2
GLOVE BIO SURGEON STRL SZ7.5 (GLOVE) ×4 IMPLANT
GLOVE BIOGEL M 7.0 STRL (GLOVE) ×2 IMPLANT
GOWN STRL REUS W/ TWL LRG LVL3 (GOWN DISPOSABLE) ×4 IMPLANT
GOWN STRL REUS W/ TWL XL LVL3 (GOWN DISPOSABLE) ×2 IMPLANT
GOWN STRL REUS W/TWL LRG LVL3 (GOWN DISPOSABLE) ×8
GOWN STRL REUS W/TWL XL LVL3 (GOWN DISPOSABLE) ×2
KIT BASIN OR (CUSTOM PROCEDURE TRAY) ×2 IMPLANT
LENS VITRECTOMY FLAT OCLR DISP (MISCELLANEOUS) IMPLANT
NDL 18GX1X1/2 (RX/OR ONLY) (NEEDLE) ×6 IMPLANT
NDL FILTER BLUNT 18X1 1/2 (NEEDLE) ×2 IMPLANT
NEEDLE 18GX1X1/2 (RX/OR ONLY) (NEEDLE) ×6 IMPLANT
NEEDLE FILTER BLUNT 18X1 1/2 (NEEDLE) ×2 IMPLANT
PACK VITRECTOMY CUSTOM (CUSTOM PROCEDURE TRAY) ×2 IMPLANT
PAD ARMBOARD 7.5X6 YLW CONV (MISCELLANEOUS) ×4 IMPLANT
PAK PIK VITRECTOMY CVS 25GA (OPHTHALMIC) ×2 IMPLANT
PROBE ENDO DIATHERMY 25G (MISCELLANEOUS) IMPLANT
PROBE LASER ILLUM FLEX CVD 25G (OPHTHALMIC) IMPLANT
SHIELD EYE LENSE ONLY DISP (GAUZE/BANDAGES/DRESSINGS) IMPLANT
SUT VICRYL 7 0 TG140 8 (SUTURE) ×2 IMPLANT
SYR 10ML LL (SYRINGE) ×2 IMPLANT
SYR 20ML LL LF (SYRINGE) ×2 IMPLANT
SYR TB 1ML LUER SLIP (SYRINGE) ×4 IMPLANT
TOWEL GREEN STERILE FF (TOWEL DISPOSABLE) ×2 IMPLANT
WATER STERILE IRR 1000ML POUR (IV SOLUTION) ×2 IMPLANT

## 2022-12-22 NOTE — Brief Op Note (Signed)
12/22/2022  1:16 PM  PATIENT:  Bradley Meyer  67 y.o. male  PRE-OPERATIVE DIAGNOSIS:  retinal detachment, right eye  POST-OPERATIVE DIAGNOSIS:  retinal detachment, right eye  PROCEDURE:  Procedure(s): PARS PLANA VITRECTOMY WITH 25 GAUGE (Right) MEMBRANE PEEL (Right) PHOTOCOAGULATION WITH LASER (Right) INSERTION OF SF6 GAS (Right)  SURGEON:  Surgeon(s) and Role:    Bernarda Caffey, MD - Primary  ASSISTANTS: Ernest Mallick, Ophthalmic Assistant    ANESTHESIA:   general  EBL:  3 mL   BLOOD ADMINISTERED:none  DRAINS: none   LOCAL MEDICATIONS USED:  NONE  SPECIMEN:  No Specimen  DISPOSITION OF SPECIMEN:  N/A  COUNTS:  YES  TOURNIQUET:  * No tourniquets in log *  DICTATION: .Note written in EPIC  PLAN OF CARE: Discharge to home after PACU  PATIENT DISPOSITION:  PACU - hemodynamically stable.   Delay start of Pharmacological VTE agent (>24hrs) due to surgical blood loss or risk of bleeding: not applicable

## 2022-12-22 NOTE — Progress Notes (Signed)
[facility] Note  12/23/2022     CHIEF COMPLAINT Patient presents for Post-op Follow-up   HISTORY OF PRESENT ILLNESS: Bradley Meyer is a 67 y.o. male who presents to the clinic today for:   HPI     Post-op Follow-up   In right eye.  Discomfort includes foreign body sensation.  Negative for pain and itching.  I, the attending physician,  performed the HPI with the patient and updated documentation appropriately.        Comments   Patient is here for a 1 day s/p 25g PPV w/ ERM peel and SF6 gas. He states that the right eye feels scratchy.      Last edited by Bernarda Caffey, MD on 12/23/2022 12:39 PM.    Pt states he had a hard time sleeping last night, he states he felt a lot of pressure on his eye and he was afraid of damaging his eye   Referring physician: Colon Branch, MD [address] [place],  [place] [address]  HISTORICAL INFORMATION:   Selected notes from the MEDICAL RECORD NUMBER Referred by: self referral LEE:  Ocular Hx- PMH-    CURRENT MEDICATIONS: No current outpatient medications on file. (Ophthalmic Drugs)   No current facility-administered medications for this visit. (Ophthalmic Drugs)   No current outpatient medications on file. (Other)   No current facility-administered medications for this visit. (Other)   REVIEW OF SYSTEMS: ROS   Positive for: Eyes Negative for: Constitutional, Gastrointestinal, Neurological, Skin, Genitourinary, Musculoskeletal, HENT, Endocrine, Cardiovascular, Respiratory, Psychiatric, Allergic/Imm, Heme/Lymph Last edited by Annie Paras, COT on 12/23/2022  8:02 AM.       ALLERGIES No Known Allergies  PAST MEDICAL HISTORY Past Medical History:  Diagnosis Date   BCC (basal cell carcinoma of skin)    nose 2006 and lower left leg   Colon polyp    GERD (gastroesophageal reflux disease)    no meds, diet controlled   History of kidney stones    passed stones    Palpitation    2010, saw [facility] Cardiology (w/u per patient negative)  - Panic Attack   Vitreous detachment 05/2010   Past Surgical History:  Procedure Laterality Date   APPENDECTOMY  1971   CATARACT EXTRACTION Bilateral 10/2021   Dr. Alanda Slim   COLONOSCOPY  07/2016   GAS INSERTION Right 12/22/2022   Procedure: INSERTION OF SF6 GAS;  Surgeon: Bernarda Caffey, MD;  Location: [facility];  Service: Ophthalmology;  Laterality: Right;   KNEE ARTHROSCOPY  2002   right   MEMBRANE PEEL Right 12/22/2022   Procedure: MEMBRANE PEEL;  Surgeon: Bernarda Caffey, MD;  Location: [facility];  Service: Ophthalmology;  Laterality: Right;   NASAL SEPTUM SURGERY  05/2018   septoplasty w/ turb reduction   PARS PLANA VITRECTOMY Right 12/22/2022   Procedure: PARS PLANA VITRECTOMY WITH 25 GAUGE;  Surgeon: Bernarda Caffey, MD;  Location: [facility];  Service: Ophthalmology;  Laterality: Right;   PHOTOCOAGULATION WITH LASER Right 12/22/2022   Procedure: PHOTOCOAGULATION WITH LASER;  Surgeon: Bernarda Caffey, MD;  Location: [facility];  Service: Ophthalmology;  Laterality: Right;   POLYPECTOMY  07/22/2016   colon polyp   UPPER GI ENDOSCOPY  07/2016   FAMILY HISTORY Family History  Problem Relation Age of Onset   Prostate cancer Father 20   Bladder Cancer Father    CAD Other        uncle, in his 54s   Colon polyps  Mother    Colon cancer Neg Hx    Stomach cancer Neg Hx    Diabetes Neg Hx    Kidney disease Neg Hx    Gallbladder disease Neg Hx    Esophageal cancer Neg Hx    Rectal cancer Neg Hx    SOCIAL HISTORY Social History   Tobacco Use   Smoking status: Former    Packs/day: 0.10    Years: 12.00    Additional pack years: 0.00    Total pack years: 1.20    Types: Cigarettes    Quit date: 07/01/2007    Years since quitting: 15.4   Smokeless tobacco: Never  Vaping Use   Vaping Use: Never used  Substance Use Topics   Alcohol use: Yes    Comment: Occassionally beer   Drug use: No       OPHTHALMIC EXAM:  Base Eye Exam      Visual Acuity (Snellen - Linear)       Right Left   Dist cc CF at 3' 20/20   Dist ph cc 20/400          Tonometry (Tonopen, 8:06 AM)       Right Left   Pressure 32 10         Pupils       Dark Light Shape React APD   Right dilated       Left 3 2 Round Brisk None         Extraocular Movement       Right Left    Full, Ortho Full, Ortho         Neuro/Psych     Oriented x3: Yes   Mood/Affect: Normal         Dilation     Right eye: 1.0% Mydriacyl, 2.5% Phenylephrine @ 8:02 AM           Slit Lamp and Fundus Exam     External Exam       Right Left   External Normal Normal         Slit Lamp Exam       Right Left   Lids/Lashes Dermatochalasis - upper lid Dermatochalasis - upper lid, Meibomian gland dysfunction   Conjunctiva/Sclera Subconjunctival hemorrhage, sutures intact White and quiet   Cornea Well healed temporal cataract wound, trace PEE, endo heme Well healed temporal cataract wound, mild EBMD, mild arcus   Anterior Chamber Deep, 3+cell Deep and quiet   Iris Round and dilated Round and dilated   Lens PC IOL in good position PC IOL in good position, trace PCO   Anterior Vitreous post vitrectomy Vitreous syneresis, trace pigment, Posterior vitreous detachment, Weiss ring, vitreous condensations         Fundus Exam       Right Left   Disc Pink and sharp, Tilted disc, +PPA/PPP mild Pallor, 360 PPA   C/D Ratio 0.2 0.1   Macula Flat under gas, ERM gone Flat, Good foveal reflex, RPE mottling and clumping   Vessels Attenuated, mild tortuosity Attenuated, Tortuous   Periphery Retina attached, good 360 laser, good laser surrounding superior retinal holes, PRE-OP: Peripheral cystoid degeneration inferiorly, retinal tear at 1200, 2 retinal tears at 0130, +SRF from 1130-0200 (focal RD); shallow pocket of peripheral SRF from 0930 to 1100, good laser barricade surrounding focal RD's, Small retinal tear at 1030 ora with good laser surrounding, mild  encroachment of SRF posteriorly through 1-2 rows of laser at 0130 position, no new RT/RD Attached, Pigmented  laser changes 0130 and 0800, mild pigment cystoid degeneration inferiorly; Mild patches of lattice at 0430 and 0600, pigmented CR scar at 730 - no holes, no new RT/RD           IMAGING AND PROCEDURES  Imaging and Procedures for 12/23/2022          ASSESSMENT/PLAN:    ICD-10-CM   1. Epiretinal membrane (ERM) of right eye  H35.371     2. Posterior vitreous detachment of both eyes  H43.813     3. Vitreous hemorrhage of right eye ([place])  H43.11     4. Right retinal detachment  H33.21     5. Retinal tear of right eye  H33.311     6. History of repair of retinal tear by laser photocoagulation  Z98.890     7. Lattice degeneration of left retina  H35.412     8. Right retinoschisis  H33.101     9. Pseudophakia  Z96.1       Epiretinal membrane, right eye  - ERM w/ mild pucker - BCVA 20/20 - asymptomatic, no metamorphopsia - pt reports significant floaters / debris OD - now POD1 PPV/TissueBlue/MP/EL/20%SF6 gas OD as below  2,3. Hemorrhagic PVD OD, PVD OS  - acute symptomatic flashes/red floaters OD, onset 06.15.23 -- pt reports improved floaters and FOL  - OS w/ old PVD w/ retinal tears in 2012 -- s/p laser retinopexy OS by JDM -- good laser in place  - VH improved but significant old condensations persist  - discussed likely improvement of visually significant vitreous condensations / opacities with PPV OD  4,5. Retinal tears w/ +SRF/focal peripheral retinal detachment, OD.   - horseshoe tears located 0130 (2), 1200 and 1030 - focal peripheral RDs from 1130-0200 and 0930-1100 - s/p laser retinopexy OD (07.14.23), (08.02.23) -- good barricade laser in place - BCVA 20/20 OD -- stable - OCT OD: Mild ERM, stable improvement vitreous opacities; trace cystic changes sup fovea -- improved, pockets of peripheral SRF ST/SN periphery caught on wide field-- not imaged today -  pt continues to experience various forms of photopsias -- broad vertical beams of light OD most recently - subretinal fluid appears to have progressed posteriorly through 1-2 rows of laser OD at 0130 position - now POD1 PPV/TissueBlue/MP/EL/20%SF6 gas OD, 03.21.24             - doing well this morning             - retina attached and good gas bubble in place             - IOP elevated at 32             - start PF 6x/day OS  zymaxid QID OS  Atropine BID OS  Brimonidine BID OS Cosopt BID OS                         PSO ung QID OS              - cont face down positioning x3 days then can decrease positioning to 50% of time; avoid laying flat on back              - eye shield when sleeping              - post op drop and positioning instructions reviewed              - tylenol/ibuprofen for pain  -  f/u next Thursday for POW1 visit  6. History of RT OS  - history of PVD w/ retinal tears in 2012 - s/p laser retinopexy OS by JDM -- good laser in place  - no new RT/RD  - continue to monitor  7. Lattice degeneration OS - focal patches of pigmented lattice inferiorly (0430 and 0600) - discussed findings, prognosis, and treatment options including observation - recommend observation for now, but discussed possible prophylactic retinopexy in the future - monitor   8. Retinoschisis - myopic retionschisis noted on OCT -- temporal macula and nasal midzone  9. Pseudophakia OU  - s/p CE/IOL OU, Jan 2023 by Dr. Alanda Slim  - IOLs in good position, doing well  - continue to monitor  Ophthalmic Meds Ordered this visit:  No orders of the defined types were placed in this encounter.    Return in about 6 days (around 12/29/2022) for f/u POW 1, DFE, OCT.  There are no Patient Instructions on file for this visit.  This document serves as a record of services personally performed by Gardiner Sleeper, MD, PhD. It was created on their behalf by Joetta Manners COT, an ophthalmic technician. The  creation of this record is the provider's dictation and/or activities during the visit.    Electronically signed by: Joetta Manners COT 03/21.2024 12:39 PM  This document serves as a record of services personally performed by Gardiner Sleeper, MD, PhD. It was created on their behalf by San Jetty. Owens Shark, OA an ophthalmic technician. The creation of this record is the provider's dictation and/or activities during the visit.    Electronically signed by: San Jetty. Owens Shark OA03.22.2024 12:39 PM  Gardiner Sleeper, M.D., Ph.D. Diseases & Surgery of the Retina and [place] 12/23/2022   I have reviewed the above documentation for accuracy and completeness, and I agree with the above. Gardiner Sleeper, M.D., Ph.D. 12/23/22 12:41 PM   Abbreviations: M myopia (nearsighted); A astigmatism; H hyperopia (farsighted); P presbyopia; Mrx spectacle prescription;  CTL contact lenses; OD right eye; OS left eye; OU both eyes  XT exotropia; ET esotropia; PEK punctate epithelial keratitis; PEE punctate epithelial erosions; DES dry eye syndrome; MGD meibomian gland dysfunction; ATs artificial tears; PFAT's preservative free artificial tears; [place] nuclear sclerotic cataract; PSC posterior subcapsular cataract; ERM epi-retinal membrane; PVD posterior vitreous detachment; RD retinal detachment; DM diabetes mellitus; DR diabetic retinopathy; NPDR non-proliferative diabetic retinopathy; PDR proliferative diabetic retinopathy; CSME clinically significant macular edema; DME diabetic macular edema; dbh dot blot hemorrhages; CWS cotton wool spot; POAG primary open angle glaucoma; C/D cup-to-disc ratio; HVF humphrey visual field; GVF goldmann visual field; OCT optical coherence tomography; IOP intraocular pressure; BRVO Branch retinal vein occlusion; CRVO central retinal vein occlusion; CRAO central retinal artery occlusion; BRAO branch retinal artery occlusion; RT retinal tear; SB scleral buckle;  PPV pars plana vitrectomy; VH Vitreous hemorrhage; PRP panretinal laser photocoagulation; IVK intravitreal kenalog; VMT vitreomacular traction; MH Macular hole;  NVD neovascularization of the disc; NVE neovascularization elsewhere; AREDS age related eye disease study; ARMD age related macular degeneration; POAG primary open angle glaucoma; EBMD epithelial/anterior basement membrane dystrophy; ACIOL anterior chamber intraocular lens; IOL intraocular lens; PCIOL posterior chamber intraocular lens; Phaco/IOL phacoemulsification with intraocular lens placement; [place] photorefractive keratectomy; LASIK laser assisted in situ keratomileusis; HTN hypertension; DM diabetes mellitus; COPD chronic obstructive pulmonary disease

## 2022-12-22 NOTE — Discharge Instructions (Addendum)
POSTOPERATIVE INSTRUCTIONS  Your doctor has performed vitreoretinal surgery on you at [facility].  - Keep eye patched and shielded until seen by Dr. Zamora 8 AM tomorrow in clinic - Do not use drops until return - FACE DOWN POSITIONING WHILE AWAKE - Sleep with belly down or on left side, avoid laying flat on back.    - No strenuous bending, stooping or lifting.  - You may not drive until further notice.  - If your doctor used a gas bubble in your eye during the procedure he will advise you on postoperative positioning. If you have a gas bubble you will be wearing a green bracelet that was applied in the operating room. The green bracelet should stay on as long as the gas bubble is in your eye. While the gas bubble is present you should not fly in an airplane. If you require general anesthesia while the gas bubble is present you must notify your anesthesiologist that an intraocular gas bubble is present so he can take the appropriate precautions.  - Tylenol or any other over-the-counter pain reliever can be used according to your doctor. If more pain medicine is required, your doctor will have a prescription for you.  - You may read, go up and down stairs, and watch television.     Brian Zamora, M.D., Ph.D.  

## 2022-12-22 NOTE — Interval H&P Note (Signed)
History and Physical Interval Note:  12/22/2022 10:26 AM  Bradley Meyer  has presented today for surgery, with the diagnosis of retinal detachment, right eye.  The various methods of treatment have been discussed with the patient and family. After consideration of risks, benefits and other options for treatment, the patient has consented to  Procedure(s): PARS PLANA VITRECTOMY WITH 25 GAUGE (Right) MEMBRANE PEEL (Right) as a surgical intervention.  The patient's history has been reviewed, patient examined, no change in status, stable for surgery.  I have reviewed the patient's chart and labs.  Questions were answered to the patient's satisfaction.     Bernarda Caffey

## 2022-12-22 NOTE — Op Note (Signed)
Date of procedure:  03.21.24   Surgeon: Bernarda Caffey, MD, PhD  Assistant: Ernest Mallick, Ophthalmic Assistant   Pre-operative Diagnoses:  Retinal detachment, right eye Epiretinal membrane, right eye   Post-operative diagnosis:  Retinal detachment, right eye Epiretinal membrane, right eye   Anesthesia: General   Procedure:   1. 25 gauge pars plana vitrectomy, Right Eye 2. TissueBlue stain, Right Eye 3. Epiretinal membrane and Internal Limiting Membrane peel, Right Eye  4. Endolaser, Right Eye 5. Injection 20% SF6, Right Eye    Indications for procedure: The patient has a history of focal peripheral retinal detachments in the right eye s/p laser retinopexy, and developed an ERM and visually significant vitreous opacities in the right eye. After discussing the risks, benefits, and alternatives to surgery, the patient electively decided to undergo surgical repair and informed consent was obtained.  The surgery was an attempt to remove the epiretinal membrane, revise and definitively repair the focal peripheral retinal detachments and potentially improve the vision within the reasonable expectations of the surgeon.    Procedure in Detail:    The patient was met in the pre-operative holding area where their identification data was verified.  It was noted that there was a signed, informed consent in the chart and the Right Eye eye was verbally verified by the patient as the operative eye and was marked with a marking pen. The patient was then taken to the operating room and placed in the supine position. General endotracheal anesthesia was induced.  The eye was then prepped with 5% betadine and draped in the normal fashion for ophthalmic surgery. The microscope was draped and swung into position, and a secondary time-out was performed to identify the correct patient, eyes, procedures, and any allergies.   A 25 gauge trocar was inserted in a 30-45 degrees fashion into the inferotemporal  quadrant 3.5 mm posterior to the limbus in this pseudophakic patient.  Correct positioning within the vitreous was verified externally with the light pipe.  The infusion was then connected to the cannula and BSS infusion was commenced.  Additional ports were placed in the superonasal and superotemporal quadrants. Viscoat was placed on the cornea and direct vitrectomy was performed to remove the anterior vitreous and hyaloid. The BIOM was used to visualize the posterior segment while a core vitrectomy was completed. The patient had an ERM with striations of the underlying retina and well-barricaded retinal detachments in the superotemporal and superonasal quadrants with multiple tears within. A posterior vitreous detachment was confirmed with the assistance of intravitreal kenalog.   TissueBlue was then used to stain the internal limiting membrane and counter-stain the ERM. A macular contact lens was placed on the eye. End-grasping ILM forceps were used to create an opening in the ILM and the ERM and ILM was peeled fully from the macula taking care to avoid traction on the fovea. TissueBlue was used to restain the ILM peel off any residual sections in the parafovea. A very wide peel was achieved without complication. The vitrectomy probe was used to remove fragments of peeled ERM and ILM.   The wide angle viewing system was brought back into position and our attention was turned to the focal, peripheral retinal detachments. On initial inspection and during the peripheral vitrectomy, there was notable traction on the detached retina and offending retinal tears. Vitreous traction was carefully removed from the retinal detachments and tears with the aid of scleral depression. The breaks were trimmed using the cutter to smooth the edges. Once all  vitreous was removed from the tears and detachments we completed a very meticulous dissection of the peripheral vitreous base using scleral depression. A complete fluid-air  exchange was performed with a soft tip extrusion cannula over the tears. After completion of these maneuvers, the retina was flat with minimal residual subretinal fluid. Under air, endolaser was applied to all retinal breaks and 360 peripherally. The endolaser was then used to apply laser retinopexy around the retinal tears, then several rows of prophylactic retinopexy were placed just posterior to the ora 360 and posterior to the previous barricade.    The superotemporal port was then removed and sutured with 7-0 vicryl, there was no leakage. 20% SF6 gas was connected to the infusion line and gas was injected into the posterior segment while venting air through the superonasal trocar using the extrusion cannula. Once a full, 40cc of gas was vented through the eye, the infusion port and venting ports were removed and they were sutured with 7-0 vicryl.  There was no leakage from the sclerotomy sites.   Subconjunctival injections of kefzol + bacitracin + polymixin b and kenalog were then administered, and antibiotic and steroid drops as well as antibiotic ointment were placed in the eye.  The drapes were removed and the eye was patched and shielded.  A green gas bracelet was placed on the patient's wrist. The patient was then taken to the post-operative area for recovery having tolerated the procedure well.  He was instructed to perform face down positioning postoperatively and to follow up in clinic the following morning as scheduled.   Estimate blood lost: none Complications: None

## 2022-12-22 NOTE — Anesthesia Preprocedure Evaluation (Signed)
Anesthesia Evaluation  Patient identified by MRN, date of birth, ID band Patient awake    Reviewed: Allergy & Precautions, H&P , NPO status , Patient's Chart, lab work & pertinent test results  Airway Mallampati: II   Neck ROM: full    Dental   Pulmonary former smoker   breath sounds clear to auscultation       Cardiovascular negative cardio ROS  Rhythm:regular Rate:Normal     Neuro/Psych    GI/Hepatic ,GERD  ,,  Endo/Other    Renal/GU      Musculoskeletal   Abdominal   Peds  Hematology   Anesthesia Other Findings   Reproductive/Obstetrics                             Anesthesia Physical Anesthesia Plan  ASA: 2  Anesthesia Plan: General   Post-op Pain Management:    Induction: Intravenous  PONV Risk Score and Plan: 2 and Ondansetron, Dexamethasone, Midazolam and Treatment may vary due to age or medical condition  Airway Management Planned: Oral ETT  Additional Equipment:   Intra-op Plan:   Post-operative Plan: Extubation in OR  Informed Consent: I have reviewed the patients History and Physical, chart, labs and discussed the procedure including the risks, benefits and alternatives for the proposed anesthesia with the patient or authorized representative who has indicated his/her understanding and acceptance.     Dental advisory given  Plan Discussed with: CRNA, Anesthesiologist and Surgeon  Anesthesia Plan Comments:        Anesthesia Quick Evaluation

## 2022-12-22 NOTE — Anesthesia Procedure Notes (Signed)
Procedure Name: Intubation Date/Time: 12/22/2022 10:58 AM  Performed by: Darletta Moll, CRNAPre-anesthesia Checklist: Patient identified, Emergency Drugs available, Suction available and Patient being monitored Patient Re-evaluated:Patient Re-evaluated prior to induction Oxygen Delivery Method: Circle system utilized Preoxygenation: Pre-oxygenation with 100% oxygen Induction Type: IV induction Ventilation: Mask ventilation without difficulty Laryngoscope Size: Mac and 4 Grade View: Grade I Tube type: Oral Tube size: 7.5 mm Number of attempts: 1 Airway Equipment and Method: Stylet and Oral airway Placement Confirmation: ETT inserted through vocal cords under direct vision, positive ETCO2 and breath sounds checked- equal and bilateral Secured at: 22 cm Tube secured with: Tape Dental Injury: Teeth and Oropharynx as per pre-operative assessment

## 2022-12-22 NOTE — Transfer of Care (Signed)
Immediate Anesthesia Transfer of Care Note  Patient: Bradley Meyer [facility]  Procedure(s) Performed: PARS PLANA VITRECTOMY WITH 25 GAUGE (Right) MEMBRANE PEEL (Right) PHOTOCOAGULATION WITH LASER (Right: Eye) INSERTION OF SF6 GAS (Right: Eye)  Patient Location: PACU  Anesthesia Type:General  Level of Consciousness: drowsy and patient cooperative  Airway & Oxygen Therapy: Patient Spontanous Breathing and Patient connected to nasal cannula oxygen  Post-op Assessment: Report given to RN, Post -op Vital signs reviewed and stable, and Patient moving all extremities X 4  Post vital signs: Reviewed and stable  Last Vitals:  Vitals Value Taken Time  BP 122/73 12/22/22 1316  Temp    Pulse 66 12/22/22 1319  Resp 15 12/22/22 1319  SpO2 97 % 12/22/22 1319  Vitals shown include unvalidated device data.  Last Pain:  Vitals:   12/22/22 0940  TempSrc:   PainSc: 0-No pain         Complications: No notable events documented.

## 2022-12-23 ENCOUNTER — Ambulatory Visit (INDEPENDENT_AMBULATORY_CARE_PROVIDER_SITE_OTHER): Payer: 59 | Admitting: Ophthalmology

## 2022-12-23 ENCOUNTER — Encounter (HOSPITAL_COMMUNITY): Payer: Self-pay | Admitting: Ophthalmology

## 2022-12-23 DIAGNOSIS — H35371 Puckering of macula, right eye: Secondary | ICD-10-CM

## 2022-12-23 DIAGNOSIS — Z961 Presence of intraocular lens: Secondary | ICD-10-CM

## 2022-12-23 DIAGNOSIS — H3321 Serous retinal detachment, right eye: Secondary | ICD-10-CM

## 2022-12-23 DIAGNOSIS — H33311 Horseshoe tear of retina without detachment, right eye: Secondary | ICD-10-CM

## 2022-12-23 DIAGNOSIS — Z9889 Other specified postprocedural states: Secondary | ICD-10-CM

## 2022-12-23 DIAGNOSIS — H43813 Vitreous degeneration, bilateral: Secondary | ICD-10-CM

## 2022-12-23 DIAGNOSIS — H4311 Vitreous hemorrhage, right eye: Secondary | ICD-10-CM

## 2022-12-23 DIAGNOSIS — H33101 Unspecified retinoschisis, right eye: Secondary | ICD-10-CM

## 2022-12-23 DIAGNOSIS — H35412 Lattice degeneration of retina, left eye: Secondary | ICD-10-CM

## 2022-12-23 NOTE — Anesthesia Postprocedure Evaluation (Signed)
Anesthesia Post Note  Patient: Bradley Meyer [facility]  Procedure(s) Performed: PARS PLANA VITRECTOMY WITH 25 GAUGE (Right) MEMBRANE PEEL (Right) PHOTOCOAGULATION WITH LASER (Right: Eye) INSERTION OF SF6 GAS (Right: Eye)     Patient location during evaluation: PACU Anesthesia Type: General Level of consciousness: awake and alert Pain management: pain level controlled Vital Signs Assessment: post-procedure vital signs reviewed and stable Respiratory status: spontaneous breathing, nonlabored ventilation, respiratory function stable and patient connected to nasal cannula oxygen Cardiovascular status: blood pressure returned to baseline and stable Postop Assessment: no apparent nausea or vomiting Anesthetic complications: no   No notable events documented.  Last Vitals:  Vitals:   12/22/22 1345 12/22/22 1415  BP: 115/77 108/77  Pulse: 68 66  Resp: 15 15  Temp:  36.7 C  SpO2: 96% 96%    Last Pain:  Vitals:   12/22/22 1345  TempSrc:   PainSc: 4                  Bradley Meyer S

## 2022-12-26 NOTE — Progress Notes (Signed)
[facility] Note  12/29/2022     CHIEF COMPLAINT Patient presents for Post-op Follow-up   HISTORY OF PRESENT ILLNESS: Bradley Meyer is a 67 y.o. male who presents to the clinic today for:   HPI     Post-op Follow-up   In right eye.  Discomfort includes foreign body sensation.  Negative for pain and itching.  I, the attending physician,  performed the HPI with the patient and updated documentation appropriately.        Comments   Patient states that the bubble in the right eye is getting smaller and moves around. The vision is still the same cloudy.   He is using PF OD 6x day, Zymaxid OD QID, Atropine OD BID, Brimonidine OD BID, and Cosopt BID OS.      Last edited by Bernarda Caffey, MD on 12/29/2022 12:40 PM.    Pt states he is staying face down pretty much all the time except when he gets his eye drops   Referring physician: Colon Branch, MD [address] [place],  [place] [address]  HISTORICAL INFORMATION:   Selected notes from the MEDICAL RECORD NUMBER Referred by: self referral LEE:  Ocular Hx- PMH-    CURRENT MEDICATIONS: Current Outpatient Medications (Ophthalmic Drugs)  Medication Sig   bacitracin-polymyxin b (POLYSPORIN) ophthalmic ointment Place into the right eye at bedtime. Place a 1/2 inch ribbon of ointment into the lower eyelid.   prednisoLONE acetate (PRED FORTE) 1 % ophthalmic suspension Place 1 drop into the right eye 6 (six) times daily.   No current facility-administered medications for this visit. (Ophthalmic Drugs)   No current outpatient medications on file. (Other)   No current facility-administered medications for this visit. (Other)   REVIEW OF SYSTEMS: ROS   Positive for: Eyes Negative for: Constitutional, Gastrointestinal, Neurological, Skin, Genitourinary, Musculoskeletal, HENT, Endocrine, Cardiovascular, Respiratory, Psychiatric, Allergic/Imm, Heme/Lymph Last edited by Annie Paras, COT on 12/29/2022 10:10 AM.        ALLERGIES No Known Allergies  PAST MEDICAL HISTORY Past Medical History:  Diagnosis Date   BCC (basal cell carcinoma of skin)    nose 2006 and lower left leg   Colon polyp    GERD (gastroesophageal reflux disease)    no meds, diet controlled   History of kidney stones    passed stones   Palpitation    2010, saw [facility] (w/u per patient negative)  - Panic Attack   Vitreous detachment 05/2010   Past Surgical History:  Procedure Laterality Date   APPENDECTOMY  1971   CATARACT EXTRACTION Bilateral 10/2021   Dr. Alanda Slim   COLONOSCOPY  07/2016   GAS INSERTION Right 12/22/2022   Procedure: INSERTION OF SF6 GAS;  Surgeon: Bernarda Caffey, MD;  Location: [facility];  Service: Ophthalmology;  Laterality: Right;   KNEE ARTHROSCOPY  2002   right   MEMBRANE PEEL Right 12/22/2022   Procedure: MEMBRANE PEEL;  Surgeon: Bernarda Caffey, MD;  Location: [facility];  Service: Ophthalmology;  Laterality: Right;   NASAL SEPTUM SURGERY  05/2018   septoplasty w/ turb reduction   PARS PLANA VITRECTOMY Right 12/22/2022   Procedure: PARS PLANA VITRECTOMY WITH 25 GAUGE;  Surgeon: Bernarda Caffey, MD;  Location: [facility];  Service: Ophthalmology;  Laterality: Right;   PHOTOCOAGULATION WITH LASER Right 12/22/2022   Procedure: PHOTOCOAGULATION WITH LASER;  Surgeon: Bernarda Caffey, MD;  Location: [facility];  Service: Ophthalmology;  Laterality: Right;   POLYPECTOMY  07/22/2016   colon polyp   UPPER GI ENDOSCOPY  07/2016   FAMILY HISTORY Family History  Problem Relation Age of Onset   Prostate cancer Father 21   Bladder Cancer Father    CAD Other        uncle, in his 52s   Colon polyps Mother    Colon cancer Neg Hx    Stomach cancer Neg Hx    Diabetes Neg Hx    Kidney disease Neg Hx    Gallbladder disease Neg Hx    Esophageal cancer Neg Hx    Rectal cancer Neg Hx    SOCIAL HISTORY Social History   Tobacco Use   Smoking status: Former    Packs/day: 0.10    Years:  12.00    Additional pack years: 0.00    Total pack years: 1.20    Types: Cigarettes    Quit date: 07/01/2007    Years since quitting: 15.5   Smokeless tobacco: Never  Vaping Use   Vaping Use: Never used  Substance Use Topics   Alcohol use: Yes    Comment: Occassionally beer   Drug use: No       OPHTHALMIC EXAM:  Base Eye Exam     Visual Acuity (Snellen - Linear)       Right Left   Dist cc CF at 3' 20/20   Dist ph cc NI          Tonometry (Tonopen, 10:15 AM)       Right Left   Pressure 12 10         Pupils       Dark Light Shape React APD   Right Dialted       Left 3 2 Round Brisk None         Visual Fields       Left Right    Full Full         Extraocular Movement       Right Left    Full, Ortho Full, Ortho         Neuro/Psych     Oriented x3: Yes   Mood/Affect: Normal         Dilation     Right eye: 2.5% Phenylephrine, 1.0% Mydriacyl @ 10:12 AM           Slit Lamp and Fundus Exam     External Exam       Right Left   External Normal Normal         Slit Lamp Exam       Right Left   Lids/Lashes Dermatochalasis - upper lid Dermatochalasis - upper lid, Meibomian gland dysfunction   Conjunctiva/Sclera Subconjunctival hemorrhage -- improved, sutures intact White and quiet   Cornea Well healed temporal cataract wound, trace PEE, endo heme, trace tear film debris, mild Descemet's folds Well healed temporal cataract wound, mild EBMD, mild arcus   Anterior Chamber Deep, 3+cell/pigment Deep and quiet   Iris Round and dilated Round and dilated   Lens PC IOL in good position PC IOL in good position, trace PCO   Anterior Vitreous post vitrectomy, 70-80% gas bubble Vitreous syneresis, trace pigment, Posterior vitreous detachment, Weiss ring, vitreous condensations         Fundus Exam       Right Left   Disc mild Pallor, Sharp rim, Compact, PPA/PPP    C/D Ratio 0.2 0.1   Macula Flat under gas, ERM gone    Vessels Attenuated,  mild tortuosity    Periphery Superior retina re-attached, good laser surrounding superior retinal holes and peripheral 360, PRE-OP: Peripheral cystoid degeneration inferiorly, retinal tear at 1200, 2 retinal tears at 0130, +SRF from 1130-0200 (focal RD); shallow pocket of peripheral SRF from 0930 to 1100, good laser barricade surrounding focal RD's, Small retinal tear at 1030 ora with good laser surrounding, mild encroachment of SRF posteriorly through 1-2 rows of laser at 0130 position, no new RT/RD            IMAGING AND PROCEDURES  Imaging and Procedures for 12/29/2022          ASSESSMENT/PLAN:    ICD-10-CM   1. Epiretinal membrane (ERM) of right eye  H35.371     2. Posterior vitreous detachment of both eyes  H43.813     3. Vitreous hemorrhage of right eye ([place])  H43.11     4. Right retinal detachment  H33.21     5. Retinal tear of right eye  H33.311     6. History of repair of retinal tear by laser photocoagulation  Z98.890     7. Lattice degeneration of left retina  H35.412     8. Right retinoschisis  H33.101     9. Pseudophakia  Z96.1      Epiretinal membrane, right eye  - ERM w/ mild pucker - BCVA 20/20 - asymptomatic, no metamorphopsia - pt reports significant floaters / debris OD - POW1 PPV/TissueBlue/MP/EL/20%SF6 gas OD as below  2,3. Hemorrhagic PVD OD, PVD OS  - acute symptomatic flashes/red floaters OD, onset 06.15.23 -- pt reports improved floaters and FOL  - OS w/ old PVD w/ retinal tears in 2012 -- s/p laser retinopexy OS by JDM -- good laser in place  - VH improved but significant old condensations persist  - discussed likely improvement of visually significant vitreous condensations / opacities with PPV OD  4,5. Retinal tears w/ +SRF/focal peripheral retinal detachment, OD.   - horseshoe tears located 0130 (2), 1200 and 1030 - focal peripheral RDs from 1130-0200 and 0930-1100 - s/p laser retinopexy OD (07.14.23), (08.02.23) -- good barricade  laser in place - PRE-OP BCVA 20/20 OD -- stable - PRE-OP - subretinal fluid progressed posteriorly through 1-2 rows of laser OD at 0130 position - now POW1 PPV/TissueBlue/MP/EL/20%SF6 gas OD, 03.21.24             - doing well              - retina attached and good gas bubble in place             - IOP improved to 12             - cont PF 6x/day OS  zymaxid QID OS -- stop on Sunday, 03.31.24 Atropine BID OS -- okay to stop now Brimonidine BID OS Cosopt BID OS -- okay to stop                         PSO ung QID OS -- dec to qhs and prn during day             - keep head elevated; avoid laying flat on back              - eye shield when sleeping x1 more week             - post op drop and positioning instructions reviewed              -  tylenol/ibuprofen for pain  - f/u 2 weeks for POW3 visit, DFE, OCT  6. History of RT OS  - history of PVD w/ retinal tears in 2012 - s/p laser retinopexy OS by JDM -- good laser in place  - no new RT/RD  - continue to monitor  7. Lattice degeneration OS - focal patches of pigmented lattice inferiorly (0430 and 0600) - discussed findings, prognosis, and treatment options including observation - recommend observation for now, but discussed possible prophylactic retinopexy in the future - monitor   8. Retinoschisis - myopic retionschisis noted on OCT -- temporal macula and nasal midzone  9. Pseudophakia OU  - s/p CE/IOL OU, Jan 2023 by Dr. Alanda Slim  - IOLs in good position, doing well  - continue to monitor  Ophthalmic Meds Ordered this visit:  Meds ordered this encounter  Medications   bacitracin-polymyxin b (POLYSPORIN) ophthalmic ointment    Sig: Place into the right eye at bedtime. Place a 1/2 inch ribbon of ointment into the lower eyelid.    Dispense:  3.5 g    Refill:  3   prednisoLONE acetate (PRED FORTE) 1 % ophthalmic suspension    Sig: Place 1 drop into the right eye 6 (six) times daily.    Dispense:  15 mL    Refill:  0      Return in about 2 weeks (around 01/12/2023) for f/u RD OD, DFE, OCT.  There are no Patient Instructions on file for this visit.  This document serves as a record of services personally performed by Gardiner Sleeper, MD, PhD. It was created on their behalf by San Jetty. Owens Shark, OA an ophthalmic technician. The creation of this record is the provider's dictation and/or activities during the visit.    Electronically signed by: San Jetty. Owens Shark, [place] 03.25.2024 12:42 PM  Gardiner Sleeper, M.D., Ph.D. Diseases & Surgery of the Retina and Vitreous [facility]  I have reviewed the above documentation for accuracy and completeness, and I agree with the above. Gardiner Sleeper, M.D., Ph.D. 12/29/22 12:44 PM   Abbreviations: M myopia (nearsighted); A astigmatism; H hyperopia (farsighted); P presbyopia; Mrx spectacle prescription;  CTL contact lenses; OD right eye; OS left eye; OU both eyes  XT exotropia; ET esotropia; PEK punctate epithelial keratitis; PEE punctate epithelial erosions; DES dry eye syndrome; MGD meibomian gland dysfunction; ATs artificial tears; PFAT's preservative free artificial tears; [place] nuclear sclerotic cataract; PSC posterior subcapsular cataract; ERM epi-retinal membrane; PVD posterior vitreous detachment; RD retinal detachment; DM diabetes mellitus; DR diabetic retinopathy; NPDR non-proliferative diabetic retinopathy; PDR proliferative diabetic retinopathy; CSME clinically significant macular edema; DME diabetic macular edema; dbh dot blot hemorrhages; CWS cotton wool spot; POAG primary open angle glaucoma; C/D cup-to-disc ratio; HVF humphrey visual field; GVF goldmann visual field; OCT optical coherence tomography; IOP intraocular pressure; BRVO Branch retinal vein occlusion; CRVO central retinal vein occlusion; CRAO central retinal artery occlusion; BRAO branch retinal artery occlusion; RT retinal tear; SB scleral buckle; PPV pars plana vitrectomy; VH Vitreous  hemorrhage; PRP panretinal laser photocoagulation; IVK intravitreal kenalog; VMT vitreomacular traction; MH Macular hole;  NVD neovascularization of the disc; NVE neovascularization elsewhere; AREDS age related eye disease study; ARMD age related macular degeneration; POAG primary open angle glaucoma; EBMD epithelial/anterior basement membrane dystrophy; ACIOL anterior chamber intraocular lens; IOL intraocular lens; PCIOL posterior chamber intraocular lens; Phaco/IOL phacoemulsification with intraocular lens placement; [place] photorefractive keratectomy; LASIK laser assisted in situ keratomileusis; HTN hypertension; DM diabetes mellitus; COPD chronic  obstructive pulmonary disease

## 2022-12-29 ENCOUNTER — Ambulatory Visit (INDEPENDENT_AMBULATORY_CARE_PROVIDER_SITE_OTHER): Payer: 59 | Admitting: Ophthalmology

## 2022-12-29 ENCOUNTER — Encounter (INDEPENDENT_AMBULATORY_CARE_PROVIDER_SITE_OTHER): Payer: Self-pay | Admitting: Ophthalmology

## 2022-12-29 DIAGNOSIS — H35371 Puckering of macula, right eye: Secondary | ICD-10-CM

## 2022-12-29 DIAGNOSIS — Z9889 Other specified postprocedural states: Secondary | ICD-10-CM

## 2022-12-29 DIAGNOSIS — H43813 Vitreous degeneration, bilateral: Secondary | ICD-10-CM

## 2022-12-29 DIAGNOSIS — H4311 Vitreous hemorrhage, right eye: Secondary | ICD-10-CM

## 2022-12-29 DIAGNOSIS — H35412 Lattice degeneration of retina, left eye: Secondary | ICD-10-CM

## 2022-12-29 DIAGNOSIS — Z961 Presence of intraocular lens: Secondary | ICD-10-CM

## 2022-12-29 DIAGNOSIS — H33101 Unspecified retinoschisis, right eye: Secondary | ICD-10-CM

## 2022-12-29 DIAGNOSIS — H33311 Horseshoe tear of retina without detachment, right eye: Secondary | ICD-10-CM

## 2022-12-29 DIAGNOSIS — H3321 Serous retinal detachment, right eye: Secondary | ICD-10-CM

## 2022-12-29 MED ORDER — PREDNISOLONE ACETATE 1 % OP SUSP: 1.0000 [drp] | Freq: Every day | OPHTHALMIC | 0 refills | Status: DC

## 2022-12-29 MED ORDER — BACITRACIN-POLYMYXIN B 500-10000 UNIT/GM OP OINT: TOPICAL_OINTMENT | Freq: Every day | OPHTHALMIC | 3 refills | Status: DC

## 2023-01-02 NOTE — Progress Notes (Signed)
[facility] - Clinic Note  01/12/2023     CHIEF COMPLAINT Patient presents for Retina Follow Up   HISTORY OF PRESENT ILLNESS: Bradley Meyer is a 67 y.o. male who presents to the clinic today for:   HPI     Retina Follow Up   Patient presents with  Other.  In right eye.  This started 2 weeks ago.  I, the attending physician,  performed the HPI with the patient and updated documentation appropriately.        Comments   Patient here for 2 weeks retina follow up for RD OD. Patient states vision good. Bubble has gone away. Was a little cloudy for a while. That is clearing up. 3 days ago (Monday) eye was itchy. Used antibiotic drop. Helped. No eye pain. Has a little discomfort or irritation temporally.using drops Pink top 6 times a day OD and purple top BID OD.      Last edited by Rennis Chris, MD on 01/12/2023  2:11 PM.    Pt states his gas bubble is gone, he states he has seen a few floaterss   Referring physician: Wanda Plump, MD [address] [place],  [place] [address]  HISTORICAL INFORMATION:   Selected notes from the MEDICAL RECORD NUMBER Referred by: self referral LEE:  Ocular Hx- PMH-    CURRENT MEDICATIONS: Current Outpatient Medications (Ophthalmic Drugs)  Medication Sig   bacitracin-polymyxin b (POLYSPORIN) ophthalmic ointment Place into the right eye at bedtime. Place a 1/2 inch ribbon of ointment into the lower eyelid.   dorzolamide-timolol (COSOPT) 2-0.5 % ophthalmic solution Place 1 drop into the right eye 2 (two) times daily.   prednisoLONE acetate (PRED FORTE) 1 % ophthalmic suspension Place 1 drop into the right eye 6 (six) times daily.   No current facility-administered medications for this visit. (Ophthalmic Drugs)   No current outpatient medications on file. (Other)   No current facility-administered medications for this visit. (Other)   REVIEW OF SYSTEMS: ROS   Positive for: Eyes Negative for:  Constitutional, Gastrointestinal, Neurological, Skin, Genitourinary, Musculoskeletal, HENT, Endocrine, Cardiovascular, Respiratory, Psychiatric, Allergic/Imm, Heme/Lymph Last edited by Laddie Aquas, COA on 01/12/2023 10:00 AM.         ALLERGIES No Known Allergies  PAST MEDICAL HISTORY Past Medical History:  Diagnosis Date   BCC (basal cell carcinoma of skin)    nose 2006 and lower left leg   Colon polyp    GERD (gastroesophageal reflux disease)    no meds, diet controlled   History of kidney stones    passed stones   Palpitation    2010, saw [facility] (w/u per patient negative)  - Panic Attack   Vitreous detachment 05/2010   Past Surgical History:  Procedure Laterality Date   APPENDECTOMY  1971   CATARACT EXTRACTION Bilateral 10/2021   Dr. Genia Del   COLONOSCOPY  07/2016   GAS INSERTION Right 12/22/2022   Procedure: INSERTION OF SF6 GAS;  Surgeon: Rennis Chris, MD;  Location[facility] OR;  Service: Ophthalmology;  Laterality: Right;   KNEE ARTHROSCOPY  2002   right   MEMBRANE PEEL Right 12/22/2022   Procedure: MEMBRANE PEEL;  Surgeon: Rennis Chris, MD;  Location: [facility] OR;  Service: Ophthalmology;  Laterality: Right;   NASAL SEPTUM SURGERY  05/2018   septoplasty w/ turb reduction   PARS PLANA VITRECTOMY Right 12/22/2022   Procedure: PARS PLANA VITRECTOMY WITH 25 GAUGE;  Surgeon: Rennis Chris, MD;  Location:  [facility] OR;  Service: Ophthalmology;  Laterality: Right;   PHOTOCOAGULATION WITH LASER Right 12/22/2022   Procedure: PHOTOCOAGULATION WITH LASER;  Surgeon: Rennis Chris, MD;  Location: [facility] OR;  Service: Ophthalmology;  Laterality: Right;   POLYPECTOMY  07/22/2016   colon polyp   UPPER GI ENDOSCOPY  07/2016   FAMILY HISTORY Family History  Problem Relation Age of Onset   Prostate cancer Father 84   Bladder Cancer Father    CAD Other        uncle, in his 70s   Colon polyps Mother    Colon cancer Neg Hx    Stomach cancer Neg Hx    Diabetes Neg Hx    Kidney disease  Neg Hx    Gallbladder disease Neg Hx    Esophageal cancer Neg Hx    Rectal cancer Neg Hx    SOCIAL HISTORY Social History   Tobacco Use   Smoking status: Former    Packs/day: 0.10    Years: 12.00    Additional pack years: 0.00    Total pack years: 1.20    Types: Cigarettes    Quit date: 07/01/2007    Years since quitting: 15.5   Smokeless tobacco: Never  Vaping Use   Vaping Use: Never used  Substance Use Topics   Alcohol use: Yes    Comment: Occassionally beer   Drug use: No       OPHTHALMIC EXAM:  Base Eye Exam     Visual Acuity (Snellen - Linear)       Right Left   Dist cc 20/50 -2 20/20   Dist ph cc 20/20 -2          Tonometry (Tonopen, 9:52 AM)       Right Left   Pressure 25,26 19  brim and one drop of cosopt 1015        Pupils       Dark Light Shape React APD   Right Dilated       Left 3 2 Round Brisk None         Visual Fields (Counting fingers)       Left Right    Full Full         Extraocular Movement       Right Left    Full, Ortho Full, Ortho         Neuro/Psych     Oriented x3: Yes   Mood/Affect: Normal         Dilation     Both eyes: 1.0% Mydriacyl, 2.5% Phenylephrine @ 9:52 AM           Slit Lamp and Fundus Exam     External Exam       Right Left   External Normal Normal         Slit Lamp Exam       Right Left   Lids/Lashes Dermatochalasis - upper lid Dermatochalasis - upper lid, Meibomian gland dysfunction   Conjunctiva/Sclera White and quiet, sutures intact White and quiet   Cornea Well healed temporal cataract wound, trace PEE, trace tear film debris Well healed temporal cataract wound, mild EBMD, mild arcus   Anterior Chamber Deep, 1-2+fine cell and pigment Deep and quiet   Iris Round and dilated Round and dilated   Lens PC IOL in good position PC IOL in good position, trace PCO   Anterior Vitreous post vitrectomy, gas bubble gone, trace pigment Vitreous syneresis, trace pigment, Posterior  vitreous detachment, Weiss ring, vitreous  condensations         Fundus Exam       Right Left   Disc mild Pallor, Sharp rim, Compact, PPA/PPP mild Pallor, 360 PPA   C/D Ratio 0.2 0.1   Macula Flat, Good foveal reflex, ERM gone, RPE mottling and clumping, No heme or edema Flat, Good foveal reflex, RPE mottling and clumping   Vessels Attenuated, mild tortuosity Attenuated, Tortuous   Periphery Attached with good laser in area of previous detachments superiorly; good peripheral laser 360; PRE-OP: retinal tear at 1200, 2 retinal tears at 0130, +SRF from 1130-0200 (focal RD); shallow pocket of peripheral SRF from 0930 to 1100, good laser barricade surrounding focal RD's, Small retinal tear at 1030 ora with good laser surrounding, mild encroachment of SRF posteriorly through 1-2 rows of laser at 0130 position Attached, Pigmented laser changes 0130 and 0800, mild pigment cystoid degeneration inferiorly; Mild patches of lattice at 0430 and 0600, pigmented CR scar at 730 - no holes, no new RT/RD           Refraction     Wearing Rx       Sphere Cylinder Axis Add   Right -1.50 +2.25 001 +2.00   Left -1.75 +2.75 178 +2.00           IMAGING AND PROCEDURES  Imaging and Procedures for 01/12/2023  OCT, Retina - OU - Both Eyes       Right Eye Quality was good. Central Foveal Thickness: 322. Progression has improved. Findings include normal foveal contour, no IRF, no SRF, myopic contour (ERM gone, stable improvement in vitreous opacities).   Left Eye Quality was good. Central Foveal Thickness: 272. Progression has been stable. Findings include normal foveal contour, no IRF, no SRF, myopic contour.   Notes *Images captured and stored on drive  Diagnosis / Impression:  OD: ERM gone, stable improvement in vitreous opacities OS: NFP; no IRF/SRF   Clinical management:  See below  Abbreviations: NFP - Normal foveal profile. CME - cystoid macular edema. PED - pigment epithelial detachment.  IRF - intraretinal fluid. SRF - subretinal fluid. EZ - ellipsoid zone. ERM - epiretinal membrane. ORA - outer retinal atrophy. ORT - outer retinal tubulation. SRHM - subretinal hyper-reflective material. IRHM - intraretinal hyper-reflective material             ASSESSMENT/PLAN:    ICD-10-CM   1. Epiretinal membrane (ERM) of right eye  H35.371 OCT, Retina - OU - Both Eyes    2. Posterior vitreous detachment of both eyes  H43.813     3. Vitreous hemorrhage of right eye  H43.11     4. Right retinal detachment  H33.21     5. Retinal tear of right eye  H33.311     6. History of repair of retinal tear by laser photocoagulation  Z98.890     7. Lattice degeneration of left retina  H35.412     8. Right retinoschisis  H33.101     9. Pseudophakia  Z96.1      Epiretinal membrane, right eye  - ERM w/ mild pucker -- pt had significant floaters and debris OD also - pre-op BCVA OD 20/20 - now POW3 PPV/TissueBlue/MP/EL/20%SF6 gas OD as below - ERM gone - BCVA OD 20/20 again already   2,3. Hemorrhagic PVD OD, PVD OS  - acute symptomatic flashes/red floaters OD, onset 06.15.23 -- pt reports improved floaters and FOL  - OS w/ old PVD w/ retinal tears in 2012 -- s/p laser retinopexy  OS by JDM -- good laser in place  - VH improved but significant old condensations persisted  - no s/p PPV OD -- condensations improved  4,5. Retinal tears w/ +SRF/focal peripheral retinal detachment, OD.   - horseshoe tears located 0130 (2), 1200 and 1030 - focal peripheral RDs from 1130-0200 and 0930-1100 - s/p laser retinopexy OD (07.14.23), (08.02.23) -- good barricade laser in place - PRE-OP BCVA 20/20 OD - PRE-OP exam - subretinal fluid progressed posteriorly through 1-2 rows of laser OD at 0130 position - now POW3 PPV/TissueBlue/MP/EL/20%SF6 gas OD, 03.21.24             - doing well              - retina attached and good gas bubble in place             - IOP elevated at 29             - cont PF  6x/day OD -- dec to QID OD Brimonidine BID OD                         PSO ung QHS/PRN OD -- okay to stop  - restart Cosopt BID OD             - d/c positioning             - post op drop and positioning instructions reviewed   - f/u 3 weeks -- POV, DFE, OCT  6. History of RT OS  - history of PVD w/ retinal tears in 2012 - s/p laser retinopexy OS by JDM -- good laser in place  - no new RT/RD  - continue to monitor  7. Lattice degeneration OS - focal patches of pigmented lattice inferiorly (0430 and 0600) - discussed findings, prognosis, and treatment options including observation - recommend observation for now, but discussed possible prophylactic retinopexy in the future - monitor   8. Retinoschisis - myopic retionschisis noted on OCT -- temporal macula and nasal midzone  9. Pseudophakia OU  - s/p CE/IOL OU, Jan 2023 by Dr. Genia Del  - IOLs in good position, doing well  - continue to monitor  Ophthalmic Meds Ordered this visit:  Meds ordered this encounter  Medications   dorzolamide-timolol (COSOPT) 2-0.5 % ophthalmic solution    Sig: Place 1 drop into the right eye 2 (two) times daily.    Dispense:  10 mL    Refill:  1     Return in about 3 weeks (around 02/02/2023) for f/u RD OD, DFE, OCT.  There are no Patient Instructions on file for this visit.  This document serves as a record of services personally performed by Karie Chimera, MD, PhD. It was created on their behalf by Glee Arvin. Manson Passey, OA an ophthalmic technician. The creation of this record is the provider's dictation and/or activities during the visit.    Electronically signed by: Glee Arvin. [place], [place] 04.01.2024 2:15 PM  Karie Chimera, M.D., Ph.D. Diseases & Surgery of the Retina and Vitreous [facility]  I have reviewed the above documentation for accuracy and completeness, and I agree with the above. Karie Chimera, M.D., Ph.D. 01/12/23 2:20 PM   Abbreviations: M myopia  (nearsighted); A astigmatism; H hyperopia (farsighted); P presbyopia; Mrx spectacle prescription;  CTL contact lenses; OD right eye; OS left eye; OU both eyes  XT exotropia; ET esotropia; PEK punctate epithelial keratitis; PEE  punctate epithelial erosions; DES dry eye syndrome; MGD meibomian gland dysfunction; ATs artificial tears; PFAT's preservative free artificial tears; [place] nuclear sclerotic cataract; PSC posterior subcapsular cataract; ERM epi-retinal membrane; PVD posterior vitreous detachment; RD retinal detachment; DM diabetes mellitus; DR diabetic retinopathy; NPDR non-proliferative diabetic retinopathy; PDR proliferative diabetic retinopathy; CSME clinically significant macular edema; DME diabetic macular edema; dbh dot blot hemorrhages; CWS cotton wool spot; POAG primary open angle glaucoma; C/D cup-to-disc ratio; HVF humphrey visual field; GVF goldmann visual field; OCT optical coherence tomography; IOP intraocular pressure; BRVO Branch retinal vein occlusion; CRVO central retinal vein occlusion; CRAO central retinal artery occlusion; BRAO branch retinal artery occlusion; RT retinal tear; SB scleral buckle; PPV pars plana vitrectomy; VH Vitreous hemorrhage; PRP panretinal laser photocoagulation; IVK intravitreal kenalog; VMT vitreomacular traction; MH Macular hole;  NVD neovascularization of the disc; NVE neovascularization elsewhere; AREDS age related eye disease study; ARMD age related macular degeneration; POAG primary open angle glaucoma; EBMD epithelial/anterior basement membrane dystrophy; ACIOL anterior chamber intraocular lens; IOL intraocular lens; PCIOL posterior chamber intraocular lens; Phaco/IOL phacoemulsification with intraocular lens placement; [place] photorefractive keratectomy; LASIK laser assisted in situ keratomileusis; HTN hypertension; DM diabetes mellitus; COPD chronic obstructive pulmonary disease

## 2023-01-12 ENCOUNTER — Encounter (INDEPENDENT_AMBULATORY_CARE_PROVIDER_SITE_OTHER): Payer: Self-pay | Admitting: Ophthalmology

## 2023-01-12 ENCOUNTER — Ambulatory Visit (INDEPENDENT_AMBULATORY_CARE_PROVIDER_SITE_OTHER): Payer: 59 | Admitting: Ophthalmology

## 2023-01-12 DIAGNOSIS — H43813 Vitreous degeneration, bilateral: Secondary | ICD-10-CM

## 2023-01-12 DIAGNOSIS — H33101 Unspecified retinoschisis, right eye: Secondary | ICD-10-CM

## 2023-01-12 DIAGNOSIS — H33311 Horseshoe tear of retina without detachment, right eye: Secondary | ICD-10-CM

## 2023-01-12 DIAGNOSIS — H35412 Lattice degeneration of retina, left eye: Secondary | ICD-10-CM

## 2023-01-12 DIAGNOSIS — H35371 Puckering of macula, right eye: Secondary | ICD-10-CM | POA: Diagnosis not present

## 2023-01-12 DIAGNOSIS — H4311 Vitreous hemorrhage, right eye: Secondary | ICD-10-CM

## 2023-01-12 DIAGNOSIS — Z961 Presence of intraocular lens: Secondary | ICD-10-CM

## 2023-01-12 DIAGNOSIS — H3321 Serous retinal detachment, right eye: Secondary | ICD-10-CM

## 2023-01-12 DIAGNOSIS — Z9889 Other specified postprocedural states: Secondary | ICD-10-CM

## 2023-01-12 MED ORDER — DORZOLAMIDE HCL-TIMOLOL MAL 2-0.5 % OP SOLN: 1.0000 [drp] | Freq: Two times a day (BID) | OPHTHALMIC | 1 refills | Status: DC

## 2023-01-16 ENCOUNTER — Other Ambulatory Visit (INDEPENDENT_AMBULATORY_CARE_PROVIDER_SITE_OTHER): Payer: Self-pay

## 2023-01-16 MED ORDER — DORZOLAMIDE HCL 2 % OP SOLN: 1.0000 [drp] | Freq: Two times a day (BID) | OPHTHALMIC | 1 refills | Status: DC

## 2023-01-16 MED ORDER — TIMOLOL MALEATE 0.5 % OP SOLN: 1.0000 [drp] | Freq: Two times a day (BID) | OPHTHALMIC | 1 refills | Status: DC

## 2023-01-31 ENCOUNTER — Ambulatory Visit: Payer: 59 | Admitting: Internal Medicine

## 2023-02-02 NOTE — Progress Notes (Signed)
[facility] - Clinic Note  02/03/2023     CHIEF COMPLAINT Patient presents for Retina Follow Up   HISTORY OF PRESENT ILLNESS: Bradley Meyer is a 67 y.o. male who presents to the clinic today for:   HPI     Retina Follow Up   Patient presents with  Retinal Break/Detachment.  In right eye.  This started 3 weeks ago.  Duration of 3 weeks.  Since onset it is stable.  I, the attending physician,  performed the HPI with the patient and updated documentation appropriately.        Comments   3 week retina follow RD OD pt is reporting vision seems little better he has noticed some floaters but denies any flashes of light       Last edited by Rennis Chris, MD on 02/03/2023 11:24 AM.     Pt is using Cosopt and brimonidine BID OD and PF QID OD, pt feels like vision may be a little clearer, he states his eye is still dilated and light sensitive   Referring physician: Wanda Plump, MD [address] [place],  [place] [address]  HISTORICAL INFORMATION:   Selected notes from the MEDICAL RECORD NUMBER Referred by: self referral LEE:  Ocular Hx- PMH-    CURRENT MEDICATIONS: Current Outpatient Medications (Ophthalmic Drugs)  Medication Sig   dorzolamide (TRUSOPT) 2 % ophthalmic solution Place 1 drop into the right eye 2 (two) times daily.   timolol (TIMOPTIC) 0.5 % ophthalmic solution Place 1 drop into the right eye 2 (two) times daily.   bacitracin-polymyxin b (POLYSPORIN) ophthalmic ointment Place into the right eye at bedtime. Place a 1/2 inch ribbon of ointment into the lower eyelid.   dorzolamide-timolol (COSOPT) 2-0.5 % ophthalmic solution Place 1 drop into the right eye 2 (two) times daily.   prednisoLONE acetate (PRED FORTE) 1 % ophthalmic suspension Place 1 drop into the right eye 6 (six) times daily.   No current facility-administered medications for this visit. (Ophthalmic Drugs)   No current outpatient medications on file. (Other)    No current facility-administered medications for this visit. (Other)   REVIEW OF SYSTEMS: ROS   Positive for: Eyes Negative for: Constitutional, Gastrointestinal, Neurological, Skin, Genitourinary, Musculoskeletal, HENT, Endocrine, Cardiovascular, Respiratory, Psychiatric, Allergic/Imm, Heme/Lymph Last edited by Etheleen Mayhew, COT on 02/03/2023 10:00 AM.          ALLERGIES No Known Allergies  PAST MEDICAL HISTORY Past Medical History:  Diagnosis Date   BCC (basal cell carcinoma of skin)    nose 2006 and lower left leg   Colon polyp    GERD (gastroesophageal reflux disease)    no meds, diet controlled   History of kidney stones    passed stones   Palpitation    2010, saw [facility] (w/u per patient negative)  - Panic Attack   Vitreous detachment 05/2010   Past Surgical History:  Procedure Laterality Date   APPENDECTOMY  1971   CATARACT EXTRACTION Bilateral 10/2021   Dr. Genia Del   COLONOSCOPY  07/2016   GAS INSERTION Right 12/22/2022   Procedure: INSERTION OF SF6 GAS;  Surgeon: Rennis Chris, MD;  Location: [facility] OR;  Service: Ophthalmology;  Laterality: Right;   KNEE ARTHROSCOPY  2002   right   MEMBRANE PEEL Right 12/22/2022   Procedure: MEMBRANE PEEL;  Surgeon: Rennis Chris, MD;  Location: [facility] OR;  Service: Ophthalmology;  Laterality: Right;   NASAL SEPTUM SURGERY  05/2018  septoplasty w/ turb reduction   PARS PLANA VITRECTOMY Right 12/22/2022   Procedure: PARS PLANA VITRECTOMY WITH 25 GAUGE;  Surgeon: Rennis Chris, MD;  Location: [facility] OR;  Service: Ophthalmology;  Laterality: Right;   PHOTOCOAGULATION WITH LASER Right 12/22/2022   Procedure: PHOTOCOAGULATION WITH LASER;  Surgeon: Rennis Chris, MD;  Location: [facility] OR;  Service: Ophthalmology;  Laterality: Right;   POLYPECTOMY  07/22/2016   colon polyp   UPPER GI ENDOSCOPY  07/2016   FAMILY HISTORY Family History  Problem Relation Age of Onset   Prostate cancer Father 44   Bladder Cancer Father    CAD  Other        uncle, in his 58s   Colon polyps Mother    Colon cancer Neg Hx    Stomach cancer Neg Hx    Diabetes Neg Hx    Kidney disease Neg Hx    Gallbladder disease Neg Hx    Esophageal cancer Neg Hx    Rectal cancer Neg Hx    SOCIAL HISTORY Social History   Tobacco Use   Smoking status: Former    Packs/day: 0.10    Years: 12.00    Additional pack years: 0.00    Total pack years: 1.20    Types: Cigarettes    Quit date: 07/01/2007    Years since quitting: 15.6   Smokeless tobacco: Never  Vaping Use   Vaping Use: Never used  Substance Use Topics   Alcohol use: Yes    Comment: Occassionally beer   Drug use: No       OPHTHALMIC EXAM:  Base Eye Exam     Visual Acuity (Snellen - Linear)       Right Left   Dist  20/25 20/20         Tonometry (Tonopen, 10:06 AM)       Right Left   Pressure 18 19         Pupils       Pupils Dark Light Shape React APD   Right PERRL 5 5 Round NR Trace   Left PERRL 3 2 Round Brisk None         Visual Fields       Left Right    Full Full         Extraocular Movement       Right Left    Full, Ortho Full, Ortho         Neuro/Psych     Oriented x3: Yes   Mood/Affect: Normal         Dilation     Both eyes: 2.5% Phenylephrine @ 10:06 AM           Slit Lamp and Fundus Exam     External Exam       Right Left   External Normal Normal         Slit Lamp Exam       Right Left   Lids/Lashes Dermatochalasis - upper lid, Meibomian gland dysfunction Dermatochalasis - upper lid, Meibomian gland dysfunction   Conjunctiva/Sclera White and quiet White and quiet   Cornea Well healed temporal cataract wound, trace PEE, trace tear film debris Well healed temporal cataract wound, mild EBMD, mild arcus   Anterior Chamber Deep, 1+cell/pigment Deep and quiet   Iris Round and dilated Round and dilated   Lens PC IOL in good position PC IOL in good position, trace PCO   Anterior Vitreous post vitrectomy, gas  bubble gone, trace pigment Vitreous syneresis,  trace pigment, Posterior vitreous detachment, Weiss ring, vitreous condensations         Fundus Exam       Right Left   Disc mild Pallor, Sharp rim, Compact, PPA/PPP mild Pallor, 360 PPA   C/D Ratio 0.2 0.1   Macula Flat, Good foveal reflex, ERM gone, RPE mottling and clumping, No heme or edema Flat, Good foveal reflex, RPE mottling and clumping   Vessels Attenuated, Tortuous Attenuated, Tortuous   Periphery Attached with good laser in area of previous detachments superiorly; good peripheral laser 360; PRE-OP: retinal tear at 1200, 2 retinal tears at 0130, +SRF from 1130-0200 (focal RD); shallow pocket of peripheral SRF from 0930 to 1100, good laser barricade surrounding focal RD's, Small retinal tear at 1030 ora with good laser surrounding, mild encroachment of SRF posteriorly through 1-2 rows of laser at 0130 position Attached, Pigmented laser changes 0130 and 0800, mild pigment cystoid degeneration inferiorly; Mild patches of lattice at 0430 and 0600, pigmented CR scar at 730 - no holes, no new RT/RD           Refraction     Wearing Rx       Sphere Cylinder Axis Add   Right -1.50 +2.25 001 +2.00   Left -1.75 +2.75 178 +2.00           IMAGING AND PROCEDURES  Imaging and Procedures for 02/03/2023  OCT, Retina - OU - Both Eyes       Right Eye Quality was good. Central Foveal Thickness: 303. Progression has been stable. Findings include no IRF, no SRF, abnormal foveal contour, myopic contour (ERM gone, interval development of mild inner retinal irregularity temporal fovea, stable improvement in vitreous opacities).   Left Eye Quality was good. Central Foveal Thickness: 272. Progression has been stable. Findings include normal foveal contour, no IRF, no SRF, myopic contour.   Notes *Images captured and stored on drive  Diagnosis / Impression:  OD: ERM gone, interval development of mild inner retinal irregularity temporal  fovea, stable improvement in vitreous opacities OS: NFP; no IRF/SRF   Clinical management:  See below  Abbreviations: NFP - Normal foveal profile. CME - cystoid macular edema. PED - pigment epithelial detachment. IRF - intraretinal fluid. SRF - subretinal fluid. EZ - ellipsoid zone. ERM - epiretinal membrane. ORA - outer retinal atrophy. ORT - outer retinal tubulation. SRHM - subretinal hyper-reflective material. IRHM - intraretinal hyper-reflective material            ASSESSMENT/PLAN:    ICD-10-CM   1. Epiretinal membrane (ERM) of right eye  H35.371 OCT, Retina - OU - Both Eyes    2. Posterior vitreous detachment of both eyes  H43.813     3. Vitreous hemorrhage of right eye (HCC)  H43.11     4. Right retinal detachment  H33.21     5. Retinal tear of right eye  H33.311     6. History of repair of retinal tear by laser photocoagulation  Z98.890     7. Lattice degeneration of left retina  H35.412     8. Right retinoschisis  H33.101     9. Pseudophakia  Z96.1      Epiretinal membrane, right eye  - ERM w/ mild pucker -- pt had significant floaters and debris OD also - pre-op BCVA OD 20/20 - now POW6 PPV/TissueBlue/MP/EL/20%SF6 gas OD (03.21.24) as below - ERM gone, OCT shows some mild inner retinal irregularity - BCVA OD 20/25   2,3. Hemorrhagic PVD OD, PVD  OS  - acute symptomatic flashes/red floaters OD, onset 06.15.23 -- pt reports improved floaters and FOL  - OS w/ old PVD w/ retinal tears in 2012 -- s/p laser retinopexy OS by JDM -- good laser in place  - VH improved but significant old condensations persisted  - no heme s/p PPV OD -- condensations improved  4,5. Retinal tears w/ +SRF/focal peripheral retinal detachment, OD.   - horseshoe tears located 0130 (2), 1200 and 1030 - focal peripheral RDs from 1130-0200 and 0930-1100 - s/p laser retinopexy OD (07.14.23), (08.02.23) -- good barricade laser in place - PRE-OP BCVA 20/20 OD - PRE-OP exam - subretinal fluid  progressed posteriorly through 1-2 rows of laser OD at 0130 position - now POW6 PPV/TissueBlue/MP/EL/20%SF6 gas OD, 03.21.24             - doing well              - retina attached              - IOP improved to 18  - AC cell improved             - start PF taper -- 3,2,1 drops daily, decrease weekly - cont Brimonidine BID OD  - cont Cosopt BID OD             - post op drop and positioning instructions reviewed   - f/u 6-8 weeks -- POV, DFE, OCT  6. History of RT OS  - history of PVD w/ retinal tears in 2012 - s/p laser retinopexy OS by JDM -- good laser in place  - no new RT/RD  - continue to monitor  7. Lattice degeneration OS - focal patches of pigmented lattice inferiorly (0430 and 0600) - discussed findings, prognosis, and treatment options including observation - recommend observation for now, but discussed possible prophylactic retinopexy in the future - monitor   8. Retinoschisis - myopic retionschisis noted on OCT -- temporal macula and nasal midzone  9. Pseudophakia OU  - s/p CE/IOL OU, Jan 2023 by Dr. Genia Del  - IOLs in good position, doing well  - continue to monitor  Ophthalmic Meds Ordered this visit:  No orders of the defined types were placed in this encounter.    Return for f/u 6-8 weeks, ERM OD, DFE, OCT.  There are no Patient Instructions on file for this visit.  This document serves as a record of services personally performed by Karie Chimera, MD, PhD. It was created on their behalf by Glee Arvin. Manson Passey, OA an ophthalmic technician. The creation of this record is the provider's dictation and/or activities during the visit.    Electronically signed by: Glee Arvin. Manson Passey, [place] 05.03.2024 11:25 AM   Karie Chimera, M.D., Ph.D. Diseases & Surgery of the Retina and Vitreous [facility]  I have reviewed the above documentation for accuracy and completeness, and I agree with the above. Karie Chimera, M.D., Ph.D. 02/03/23 11:28  AM   Abbreviations: M myopia (nearsighted); A astigmatism; H hyperopia (farsighted); P presbyopia; Mrx spectacle prescription;  CTL contact lenses; OD right eye; OS left eye; OU both eyes  XT exotropia; ET esotropia; PEK punctate epithelial keratitis; PEE punctate epithelial erosions; DES dry eye syndrome; MGD meibomian gland dysfunction; ATs artificial tears; PFAT's preservative free artificial tears; NSC nuclear sclerotic cataract; PSC posterior subcapsular cataract; ERM epi-retinal membrane; PVD posterior vitreous detachment; RD retinal detachment; DM diabetes mellitus; DR diabetic retinopathy; NPDR non-proliferative diabetic retinopathy;  PDR proliferative diabetic retinopathy; CSME clinically significant macular edema; DME diabetic macular edema; dbh dot blot hemorrhages; CWS cotton wool spot; POAG primary open angle glaucoma; C/D cup-to-disc ratio; HVF humphrey visual field; GVF goldmann visual field; OCT optical coherence tomography; IOP intraocular pressure; BRVO Branch retinal vein occlusion; CRVO central retinal vein occlusion; CRAO central retinal artery occlusion; BRAO branch retinal artery occlusion; RT retinal tear; SB scleral buckle; PPV pars plana vitrectomy; VH Vitreous hemorrhage; PRP panretinal laser photocoagulation; IVK intravitreal kenalog; VMT vitreomacular traction; MH Macular hole;  NVD neovascularization of the disc; NVE neovascularization elsewhere; AREDS age related eye disease study; ARMD age related macular degeneration; POAG primary open angle glaucoma; EBMD epithelial/anterior basement membrane dystrophy; ACIOL anterior chamber intraocular lens; IOL intraocular lens; PCIOL posterior chamber intraocular lens; Phaco/IOL phacoemulsification with intraocular lens placement; [place] photorefractive keratectomy; LASIK laser assisted in situ keratomileusis; HTN hypertension; DM diabetes mellitus; COPD chronic obstructive pulmonary disease

## 2023-02-03 ENCOUNTER — Encounter (INDEPENDENT_AMBULATORY_CARE_PROVIDER_SITE_OTHER): Payer: Self-pay | Admitting: Ophthalmology

## 2023-02-03 ENCOUNTER — Ambulatory Visit (INDEPENDENT_AMBULATORY_CARE_PROVIDER_SITE_OTHER): Payer: 59 | Admitting: Ophthalmology

## 2023-02-03 DIAGNOSIS — Z9889 Other specified postprocedural states: Secondary | ICD-10-CM

## 2023-02-03 DIAGNOSIS — H43813 Vitreous degeneration, bilateral: Secondary | ICD-10-CM

## 2023-02-03 DIAGNOSIS — H35371 Puckering of macula, right eye: Secondary | ICD-10-CM | POA: Diagnosis not present

## 2023-02-03 DIAGNOSIS — H3321 Serous retinal detachment, right eye: Secondary | ICD-10-CM | POA: Diagnosis not present

## 2023-02-03 DIAGNOSIS — H35412 Lattice degeneration of retina, left eye: Secondary | ICD-10-CM

## 2023-02-03 DIAGNOSIS — H4311 Vitreous hemorrhage, right eye: Secondary | ICD-10-CM | POA: Diagnosis not present

## 2023-02-03 DIAGNOSIS — Z961 Presence of intraocular lens: Secondary | ICD-10-CM

## 2023-02-03 DIAGNOSIS — H33311 Horseshoe tear of retina without detachment, right eye: Secondary | ICD-10-CM

## 2023-02-03 DIAGNOSIS — H33101 Unspecified retinoschisis, right eye: Secondary | ICD-10-CM

## 2023-02-14 ENCOUNTER — Other Ambulatory Visit (INDEPENDENT_AMBULATORY_CARE_PROVIDER_SITE_OTHER): Payer: Self-pay

## 2023-02-14 MED ORDER — BRIMONIDINE TARTRATE 0.2 % OP SOLN: 1.0000 [drp] | Freq: Two times a day (BID) | OPHTHALMIC | 3 refills | Status: DC

## 2023-02-20 ENCOUNTER — Encounter: Payer: Self-pay | Admitting: Internal Medicine

## 2023-02-20 ENCOUNTER — Ambulatory Visit (INDEPENDENT_AMBULATORY_CARE_PROVIDER_SITE_OTHER): Payer: 59 | Admitting: Internal Medicine

## 2023-02-20 VITALS — BP 122/66 | HR 54 | Temp 98.0°F | Resp 16 | Ht 71.0 in | Wt 157.2 lb

## 2023-02-20 DIAGNOSIS — R7989 Other specified abnormal findings of blood chemistry: Secondary | ICD-10-CM | POA: Diagnosis not present

## 2023-02-20 DIAGNOSIS — K529 Noninfective gastroenteritis and colitis, unspecified: Secondary | ICD-10-CM

## 2023-02-20 LAB — BASIC METABOLIC PANEL
BUN: 13 mg/dL (ref 6–23)
CO2: 28 mEq/L (ref 19–32)
Calcium: 8.9 mg/dL (ref 8.4–10.5)
Chloride: 105 mEq/L (ref 96–112)
Creatinine, Ser: 1.19 mg/dL (ref 0.40–1.50)
GFR: 64.39 mL/min (ref >60.00)
Glucose, Bld: 86 mg/dL (ref 70–99)
Potassium: 4.8 mEq/L (ref 3.5–5.1)
Sodium: 140 mEq/L (ref 135–145)

## 2023-02-20 NOTE — Progress Notes (Signed)
Subjective:    Patient ID: Bradley Meyer, male    DOB: 22-Feb-1956, 67 y.o.   MRN: [id number]  DOS:  02/20/2023 Type of visit - description: Follow-up  Last creatinine was slightly elevated, would like to recheck it.  Recently developed lower extremity edema, bilateral, in the context of having retinal surgery and stay on the sitting position without moving for several hours.  Long history of loose stools, in the last 4 weeks they have been even slightly more loose and sometimes BMs are "explosive". No constipation, no blood in the stools, no unwelcome weight loss.  No nausea vomiting.   Wt Readings from Last 3 Encounters:  02/20/23 157 lb 4 oz (71.3 kg)  12/22/22 155 lb (70.3 kg)  12/16/22 160 lb 8 oz (72.8 kg)     Review of Systems See above   Past Medical History:  Diagnosis Date   BCC (basal cell carcinoma of skin)    nose 2006 and lower left leg   Colon polyp    GERD (gastroesophageal reflux disease)    no meds, diet controlled   History of kidney stones    passed stones   Palpitation    2010, saw [facility] (w/u per patient negative)  - Panic Attack   Vitreous detachment 05/2010    Past Surgical History:  Procedure Laterality Date   APPENDECTOMY  1971   CATARACT EXTRACTION Bilateral 10/2021   Dr. Genia Del   COLONOSCOPY  07/2016   GAS INSERTION Right 12/22/2022   Procedure: INSERTION OF SF6 GAS;  Surgeon: Rennis Chris, MD;  Location: [facility] OR;  Service: Ophthalmology;  Laterality: Right;   KNEE ARTHROSCOPY  2002   right   MEMBRANE PEEL Right 12/22/2022   Procedure: MEMBRANE PEEL;  Surgeon: Rennis Chris, MD;  Location: [facility] OR;  Service: Ophthalmology;  Laterality: Right;   NASAL SEPTUM SURGERY  05/2018   septoplasty w/ turb reduction   PARS PLANA VITRECTOMY Right 12/22/2022   Procedure: PARS PLANA VITRECTOMY WITH 25 GAUGE;  Surgeon: Rennis Chris, MD;  Location: [facility] OR;  Service: Ophthalmology;  Laterality: Right;   PHOTOCOAGULATION WITH LASER Right  12/22/2022   Procedure: PHOTOCOAGULATION WITH LASER;  Surgeon: Rennis Chris, MD;  Location: [facility] OR;  Service: Ophthalmology;  Laterality: Right;   POLYPECTOMY  07/22/2016   colon polyp   UPPER GI ENDOSCOPY  07/2016    Current Outpatient Medications  Medication Instructions   bacitracin-polymyxin b (POLYSPORIN) ophthalmic ointment Right Eye, Daily at bedtime, Place a 1/2 inch ribbon of ointment into the lower eyelid.   brimonidine (ALPHAGAN) 0.2 % ophthalmic solution 1 drop, Right Eye, 2 times daily   dorzolamide (TRUSOPT) 2 % ophthalmic solution 1 drop, Right Eye, 2 times daily   dorzolamide-timolol (COSOPT) 2-0.5 % ophthalmic solution 1 drop, Right Eye, 2 times daily   prednisoLONE acetate (PRED FORTE) 1 % ophthalmic suspension 1 drop, Right Eye, 6 times daily   timolol (TIMOPTIC) 0.5 % ophthalmic solution 1 drop, Right Eye, 2 times daily       Objective:   Physical Exam BP 122/66   Pulse (!) 54   Temp 98 F (36.7 C) (Oral)   Resp 16   Ht 5\' 11"  (1.803 m)   Wt 157 lb 4 oz (71.3 kg)   SpO2 97%   BMI 21.93 kg/m  General:   Well developed, NAD, BMI noted.  HEENT:  Normocephalic . Face symmetric, atraumatic Lungs:  CTA B Normal respiratory effort, no intercostal retractions, no accessory muscle use. Heart:  RRR,  no murmur.  Abdomen:  Not distended, soft, non-tender. No rebound or rigidity.   Skin: Not pale. Not jaundice Lower extremities: no pretibial edema bilaterally  Neurologic:  alert & oriented X3.  Speech normal, gait appropriate for age and unassisted Psych--  Cognition and judgment appear intact.  Cooperative with normal attention span and concentration.  Behavior appropriate. No anxious or depressed appearing.     Assessment     Assessment H/o Anxiety H/o Palpitations 2010, saw cards, workup (-) per pt BCC nose 2016, sees derm q year L Vitreous detachment 2011 R vitreous  detachment and retinal tear 04-2022  Chronic abdominal pain: 2017- saw GI >>  EGD,  Cscope, CT unrevealing. Sx resolved  w/h gluten-free diet Chronic diarrhea: Saw GI 2021 W/U-. Vegetarian  Liver cysts-- see OV 07-28-2021  PLAN: Increase creatinine: Last creatinine was mildly elevated, at that time he was taking protein supplements which he now has stopped.  Recheck a BMP. Chronic diarrhea: He reports chronic loose stools, this was actually discussed with GI in 2021, workup was negative.  He is up-to-date on colon cancer screening.  Symptoms have increased a little for the last 4 weeks.  No red flags.  We agreed on observation for now. LE edema: In the context of having eye surgery and being in the same position for many hours.  Edema has resolved.  Likely benign.  Observation. Social: Patient is retired, remains extremely active exercising 5 times a week. RTC CPX 07-2023

## 2023-02-20 NOTE — Assessment & Plan Note (Signed)
Increase creatinine: Last creatinine was mildly elevated, at that time he was taking protein supplements which he now has stopped.  Recheck a BMP. Chronic diarrhea: He reports chronic loose stools, this was actually discussed with GI in 2021, workup was negative.  He is up-to-date on colon cancer screening.  Symptoms have increased a little for the last 4 weeks.  No red flags.  We agreed on observation for now. LE edema: In the context of having eye surgery and being in the same position for many hours.  Edema has resolved.  Likely benign.  Observation. Social: Patient is retired, remains extremely active exercising 5 times a week. RTC CPX 07-2023

## 2023-02-20 NOTE — Patient Instructions (Addendum)
It was good to see you today.  Vaccines I recommend: Covid booster  GO TO THE LAB : Get the blood work     GO TO THE FRONT DESK, PLEASE SCHEDULE YOUR APPOINTMENTS Come back for   a physical exam by October

## 2023-03-14 NOTE — Progress Notes (Signed)
[facility] - Clinic Note  03/22/2023     CHIEF COMPLAINT Patient presents for Retina Follow Up   HISTORY OF PRESENT ILLNESS: Bradley Meyer is a 67 y.o. male who presents to the clinic today for:   HPI     Retina Follow Up   In right eye.  This started 7 weeks ago.  Duration of 7 weeks.  Since onset it is stable.  I, the attending physician,  performed the HPI with the patient and updated documentation appropriately.        Comments   7 week retina follow up ERM OS pt is reporting stable vision he denies any flashes or floaters he has been having some itching in the corner of OD      Last edited by Rennis Chris, MD on 03/22/2023  5:20 PM.    Pt states his vision is good, he has tapered off PF, he is using brimonidine and Cosopt BID OD  Referring physician: Wanda Plump, MD [address] [place],  [place] [address]  HISTORICAL INFORMATION:   Selected notes from the MEDICAL RECORD NUMBER Referred by: self referral LEE:  Ocular Hx- PMH-    CURRENT MEDICATIONS: Current Outpatient Medications (Ophthalmic Drugs)  Medication Sig   bacitracin-polymyxin b (POLYSPORIN) ophthalmic ointment Place into the right eye at bedtime. Place a 1/2 inch ribbon of ointment into the lower eyelid.   brimonidine (ALPHAGAN) 0.2 % ophthalmic solution Place 1 drop into the right eye in the morning and at bedtime.   dorzolamide (TRUSOPT) 2 % ophthalmic solution Place 1 drop into the right eye 2 (two) times daily.   dorzolamide-timolol (COSOPT) 2-0.5 % ophthalmic solution Place 1 drop into the right eye 2 (two) times daily.   prednisoLONE acetate (PRED FORTE) 1 % ophthalmic suspension Place 1 drop into the right eye 6 (six) times daily.   timolol (TIMOPTIC) 0.5 % ophthalmic solution Place 1 drop into the right eye 2 (two) times daily.   No current facility-administered medications for this visit. (Ophthalmic Drugs)   No current outpatient medications on  file. (Other)   No current facility-administered medications for this visit. (Other)   REVIEW OF SYSTEMS: ROS   Positive for: Eyes Negative for: Constitutional, Gastrointestinal, Neurological, Skin, Genitourinary, Musculoskeletal, HENT, Endocrine, Cardiovascular, Respiratory, Psychiatric, Allergic/Imm, Heme/Lymph Last edited by Etheleen Mayhew, COT on 03/22/2023  9:57 AM.     ALLERGIES No Known Allergies  PAST MEDICAL HISTORY Past Medical History:  Diagnosis Date   BCC (basal cell carcinoma of skin)    nose 2006 and lower left leg   Colon polyp    GERD (gastroesophageal reflux disease)    no meds, diet controlled   History of kidney stones    passed stones   Palpitation    2010, saw [facility] (w/u per patient negative)  - Panic Attack   Vitreous detachment 05/2010   Past Surgical History:  Procedure Laterality Date   APPENDECTOMY  1971   CATARACT EXTRACTION Bilateral 10/2021   Dr. Genia Del   COLONOSCOPY  07/2016   GAS INSERTION Right 12/22/2022   Procedure: INSERTION OF SF6 GAS;  Surgeon: Rennis Chris, MD;  Location[facility] OR;  Service: Ophthalmology;  Laterality: Right;   KNEE ARTHROSCOPY  2002   right   MEMBRANE PEEL Right 12/22/2022   Procedure: MEMBRANE PEEL;  Surgeon: Rennis Chris, MD;  Location: [facility] OR;  Service: Ophthalmology;  Laterality: Right;   NASAL SEPTUM SURGERY  05/2018  septoplasty w/ turb reduction   PARS PLANA VITRECTOMY Right 12/22/2022   Procedure: PARS PLANA VITRECTOMY WITH 25 GAUGE;  Surgeon: Rennis Chris, MD;  Location: [facility] OR;  Service: Ophthalmology;  Laterality: Right;   PHOTOCOAGULATION WITH LASER Right 12/22/2022   Procedure: PHOTOCOAGULATION WITH LASER;  Surgeon: Rennis Chris, MD;  Location: [facility] OR;  Service: Ophthalmology;  Laterality: Right;   POLYPECTOMY  07/22/2016   colon polyp   UPPER GI ENDOSCOPY  07/2016   FAMILY HISTORY Family History  Problem Relation Age of Onset   Prostate cancer Father 75   Bladder Cancer Father     CAD Other        uncle, in his 49s   Colon polyps Mother    Colon cancer Neg Hx    Stomach cancer Neg Hx    Diabetes Neg Hx    Kidney disease Neg Hx    Gallbladder disease Neg Hx    Esophageal cancer Neg Hx    Rectal cancer Neg Hx    SOCIAL HISTORY Social History   Tobacco Use   Smoking status: Former    Packs/day: 0.10    Years: 12.00    Additional pack years: 0.00    Total pack years: 1.20    Types: Cigarettes    Quit date: 07/01/2007    Years since quitting: 15.7   Smokeless tobacco: Never  Vaping Use   Vaping Use: Never used  Substance Use Topics   Alcohol use: Yes    Comment: Occassionally beer   Drug use: No       OPHTHALMIC EXAM:  Base Eye Exam     Visual Acuity (Snellen - Linear)       Right Left   Dist cc 20/25 20/20    Correction: Glasses         Tonometry (Tonopen, 10:02 AM)       Right Left   Pressure 15 15         Pupils       Pupils Dark Light Shape React APD   Right PERRL 5 5 Round Minimal Trace   Left PERRL 3 2 Round Brisk None         Visual Fields       Left Right    Full Full         Extraocular Movement       Right Left    Full, Ortho Full, Ortho         Neuro/Psych     Oriented x3: Yes   Mood/Affect: Normal         Dilation     Both eyes:            Slit Lamp and Fundus Exam     External Exam       Right Left   External Normal Normal         Slit Lamp Exam       Right Left   Lids/Lashes Dermatochalasis - upper lid, Meibomian gland dysfunction Dermatochalasis - upper lid, Meibomian gland dysfunction   Conjunctiva/Sclera White and quiet White and quiet   Cornea Well healed temporal cataract wound, trace PEE, trace tear film debris, trace EBMD superiorly Well healed temporal cataract wound, mild EBMD, mild arcus   Anterior Chamber Deep, 1+cell/pigment Deep and quiet   Iris Round and dilated Round and dilated   Lens PC IOL in good position PC IOL in good position, trace PCO   Anterior  Vitreous post vitrectomy, gas bubble gone,  trace pigment Vitreous syneresis, trace pigment, Posterior vitreous detachment, Weiss ring, vitreous condensations         Fundus Exam       Right Left   Disc mild Pallor, Sharp rim, Compact, PPA/PPP mild Pallor, 360 PPA   C/D Ratio 0.2 0.1   Macula Flat, Good foveal reflex, ERM gone, RPE mottling and clumping, No heme or edema Flat, Good foveal reflex, RPE mottling and clumping   Vessels Attenuated, mild tortuosity Attenuated, Tortuous   Periphery Attached with good laser in area of previous detachments superiorly; good peripheral laser 360; PRE-OP: retinal tear at 1200, 2 retinal tears at 0130, +SRF from 1130-0200 (focal RD); shallow pocket of peripheral SRF from 0930 to 1100, good laser barricade surrounding focal RD's, Small retinal tear at 1030 ora with good laser surrounding, mild encroachment of SRF posteriorly through 1-2 rows of laser at 0130 position Attached, Pigmented laser changes at 0130 and 0800, mild pigment cystoid degeneration inferiorly; Mild patches of lattice at 0430 and 0600, pigmented CR scar at 730 - no holes, no new RT/RD           Refraction     Wearing Rx       Sphere Cylinder Axis Add   Right -1.50 +2.25 001 +2.00   Left -1.75 +2.75 178 +2.00           IMAGING AND PROCEDURES  Imaging and Procedures for 03/22/2023  OCT, Retina - OU - Both Eyes       Right Eye Quality was good. Central Foveal Thickness: 294. Progression has been stable. Findings include no IRF, no SRF, abnormal foveal contour, myopic contour (ERM gone, mild inner retinal irregularity temporal fovea, stable improvement in vitreous opacities).   Left Eye Quality was good. Central Foveal Thickness: 273. Progression has been stable. Findings include normal foveal contour, no IRF, no SRF, myopic contour.   Notes *Images captured and stored on drive  Diagnosis / Impression:  OD: ERM gone, mild inner retinal irregularity temporal fovea,  stable improvement in vitreous opacities OS: NFP; no IRF/SRF   Clinical management:  See below  Abbreviations: NFP - Normal foveal profile. CME - cystoid macular edema. PED - pigment epithelial detachment. IRF - intraretinal fluid. SRF - subretinal fluid. EZ - ellipsoid zone. ERM - epiretinal membrane. ORA - outer retinal atrophy. ORT - outer retinal tubulation. SRHM - subretinal hyper-reflective material. IRHM - intraretinal hyper-reflective material            ASSESSMENT/PLAN:    ICD-10-CM   1. Epiretinal membrane (ERM) of right eye  H35.371 OCT, Retina - OU - Both Eyes    2. Posterior vitreous detachment of both eyes  H43.813     3. Vitreous hemorrhage of right eye (HCC)  H43.11     4. Right retinal detachment  H33.21     5. Retinal tear of right eye  H33.311     6. History of repair of retinal tear by laser photocoagulation  Z98.890     7. Lattice degeneration of left retina  H35.412     8. Right retinoschisis  H33.101     9. Pseudophakia  Z96.1      Epiretinal membrane, right eye  - ERM w/ mild pucker -- pt had significant floaters and debris OD also - pre-op BCVA OD 20/20 - PPV/TissueBlue/MP/EL/20%SF6 gas OD (03.21.24) as below - ERM gone, OCT shows some mild inner retinal irregularity - BCVA OD 20/25   2,3. Hemorrhagic PVD OD, PVD OS  -  acute symptomatic flashes/red floaters OD, onset 06.15.23 -- pt reports improved floaters and FOL  - OS w/ old PVD w/ retinal tears in 2012 -- s/p laser retinopexy OS by JDM -- good laser in place  - VH improved but significant old condensations persisted  - no heme s/p PPV OD -- condensations improved  4,5. Retinal tears w/ +SRF/focal peripheral retinal detachment, OD.   - horseshoe tears located 0130 (2), 1200 and 1030 - focal peripheral RDs from 1130-0200 and 0930-1100 - s/p laser retinopexy OD (07.14.23), (08.02.23) -- good barricade laser in place - PRE-OP BCVA 20/20 OD - PRE-OP exam - subretinal fluid progressed  posteriorly through 1-2 rows of laser OD at 0130 position - PPV/TissueBlue/MP/EL/20%SF6 gas OD, 03.21.24             - doing well              - retina attached              - IOP 15  - AC cell improved             - completed PF taper  - cont Brimonidine BID OD  - stop Cosopt BID OD  - f/u 4-6 months -- DFE, OCT  6. History of RT OS  - history of PVD w/ retinal tears in 2012 - s/p laser retinopexy OS by JDM -- good laser in place  - no new RT/RD  - continue to monitor  7. Lattice degeneration OS - focal patches of pigmented lattice inferiorly (0430 and 0600) -- asymptomatic - discussed findings, prognosis, and treatment options including observation - recommend observation for now, but discussed possible prophylactic retinopexy in the future - monitor   8. Retinoschisis - myopic retionschisis noted on OCT -- temporal macula and nasal midzone  9. Pseudophakia OU  - s/p CE/IOL OU, Jan 2023 by Dr. Genia Del  - IOLs in good position, doing well  - continue to monitor  Ophthalmic Meds Ordered this visit:  No orders of the defined types were placed in this encounter.    Return for f/u 4-6 months, ERM OD, DFE, OCT.  There are no Patient Instructions on file for this visit.  This document serves as a record of services personally performed by Karie Chimera, MD, PhD. It was created on their behalf by De Blanch, an ophthalmic technician. The creation of this record is the provider's dictation and/or activities during the visit.    Electronically signed by: De Blanch, OA, 03/22/23  5:26 PM  This document serves as a record of services personally performed by Karie Chimera, MD, PhD. It was created on their behalf by Glee Arvin. Manson Passey, OA an ophthalmic technician. The creation of this record is the provider's dictation and/or activities during the visit.    Electronically signed by: Glee Arvin. Manson Passey, [place] 06.19.2024 5:26 PM   Karie Chimera, M.D., Ph.D. Diseases &  Surgery of the Retina and Vitreous [facility]  I have reviewed the above documentation for accuracy and completeness, and I agree with the above. Karie Chimera, M.D., Ph.D. 03/22/23 5:28 PM   Abbreviations: M myopia (nearsighted); A astigmatism; H hyperopia (farsighted); P presbyopia; Mrx spectacle prescription;  CTL contact lenses; OD right eye; OS left eye; OU both eyes  XT exotropia; ET esotropia; PEK punctate epithelial keratitis; PEE punctate epithelial erosions; DES dry eye syndrome; MGD meibomian gland dysfunction; ATs artificial tears; PFAT's preservative free artificial tears; NSC nuclear sclerotic cataract;  PSC posterior subcapsular cataract; ERM epi-retinal membrane; PVD posterior vitreous detachment; RD retinal detachment; DM diabetes mellitus; DR diabetic retinopathy; NPDR non-proliferative diabetic retinopathy; PDR proliferative diabetic retinopathy; CSME clinically significant macular edema; DME diabetic macular edema; dbh dot blot hemorrhages; CWS cotton wool spot; POAG primary open angle glaucoma; C/D cup-to-disc ratio; HVF humphrey visual field; GVF goldmann visual field; OCT optical coherence tomography; IOP intraocular pressure; BRVO Branch retinal vein occlusion; CRVO central retinal vein occlusion; CRAO central retinal artery occlusion; BRAO branch retinal artery occlusion; RT retinal tear; SB scleral buckle; PPV pars plana vitrectomy; VH Vitreous hemorrhage; PRP panretinal laser photocoagulation; IVK intravitreal kenalog; VMT vitreomacular traction; MH Macular hole;  NVD neovascularization of the disc; NVE neovascularization elsewhere; AREDS age related eye disease study; ARMD age related macular degeneration; POAG primary open angle glaucoma; EBMD epithelial/anterior basement membrane dystrophy; ACIOL anterior chamber intraocular lens; IOL intraocular lens; PCIOL posterior chamber intraocular lens; Phaco/IOL phacoemulsification with intraocular lens placement;  PRK photorefractive keratectomy; LASIK laser assisted in situ keratomileusis; HTN hypertension; DM diabetes mellitus; COPD chronic obstructive pulmonary disease

## 2023-03-22 ENCOUNTER — Encounter (INDEPENDENT_AMBULATORY_CARE_PROVIDER_SITE_OTHER): Payer: Self-pay | Admitting: Ophthalmology

## 2023-03-22 ENCOUNTER — Ambulatory Visit (INDEPENDENT_AMBULATORY_CARE_PROVIDER_SITE_OTHER): Payer: 59 | Admitting: Ophthalmology

## 2023-03-22 DIAGNOSIS — H35371 Puckering of macula, right eye: Secondary | ICD-10-CM

## 2023-03-22 DIAGNOSIS — H35412 Lattice degeneration of retina, left eye: Secondary | ICD-10-CM

## 2023-03-22 DIAGNOSIS — H3321 Serous retinal detachment, right eye: Secondary | ICD-10-CM

## 2023-03-22 DIAGNOSIS — Z961 Presence of intraocular lens: Secondary | ICD-10-CM

## 2023-03-22 DIAGNOSIS — H4311 Vitreous hemorrhage, right eye: Secondary | ICD-10-CM

## 2023-03-22 DIAGNOSIS — H43813 Vitreous degeneration, bilateral: Secondary | ICD-10-CM

## 2023-03-22 DIAGNOSIS — H33101 Unspecified retinoschisis, right eye: Secondary | ICD-10-CM

## 2023-03-22 DIAGNOSIS — H33311 Horseshoe tear of retina without detachment, right eye: Secondary | ICD-10-CM

## 2023-03-22 DIAGNOSIS — Z9889 Other specified postprocedural states: Secondary | ICD-10-CM

## 2023-05-17 ENCOUNTER — Ambulatory Visit (INDEPENDENT_AMBULATORY_CARE_PROVIDER_SITE_OTHER): Payer: Medicare Other | Admitting: Internal Medicine

## 2023-05-17 VITALS — BP 108/72 | HR 59 | Temp 98.7°F | Resp 18 | Ht 71.0 in | Wt 157.8 lb

## 2023-05-17 DIAGNOSIS — R1032 Left lower quadrant pain: Secondary | ICD-10-CM

## 2023-05-17 NOTE — Progress Notes (Signed)
Subjective:    Patient ID: Bradley Meyer, male    DOB: 28-Mar-1956, 67 y.o.   MRN: [id number]  DOS:  05/17/2023 Type of visit - description: Acute  Experiencing pain for the last 2 months. Points to the area of pain , it is the distal LLQ abdomen/suprapubic area. The pain is on and off. Pain worse when he do crunches, stretches his abdomen or play tennis. Pain does not change with bowel movements or eating. Occasional radiation to the left testicle without scrotal swelling.  No fever chills No weight loss Has chronic diarrhea, unchanged.  No blood in the stools.  No LUTS  Review of Systems See above   Past Medical History:  Diagnosis Date   BCC (basal cell carcinoma of skin)    nose 2006 and lower left leg   Colon polyp    GERD (gastroesophageal reflux disease)    no meds, diet controlled   History of kidney stones    passed stones   Palpitation    2010, saw [facility] (w/u per patient negative)  - Panic Attack   Vitreous detachment 05/2010    Past Surgical History:  Procedure Laterality Date   APPENDECTOMY  1971   CATARACT EXTRACTION Bilateral 10/2021   Dr. Genia Del   COLONOSCOPY  07/2016   GAS INSERTION Right 12/22/2022   Procedure: INSERTION OF SF6 GAS;  Surgeon: Rennis Chris, MD;  Location: [facility] OR;  Service: Ophthalmology;  Laterality: Right;   KNEE ARTHROSCOPY  2002   right   MEMBRANE PEEL Right 12/22/2022   Procedure: MEMBRANE PEEL;  Surgeon: Rennis Chris, MD;  Location: [facility] OR;  Service: Ophthalmology;  Laterality: Right;   NASAL SEPTUM SURGERY  05/2018   septoplasty w/ turb reduction   PARS PLANA VITRECTOMY Right 12/22/2022   Procedure: PARS PLANA VITRECTOMY WITH 25 GAUGE;  Surgeon: Rennis Chris, MD;  Location: [facility] OR;  Service: Ophthalmology;  Laterality: Right;   PHOTOCOAGULATION WITH LASER Right 12/22/2022   Procedure: PHOTOCOAGULATION WITH LASER;  Surgeon: Rennis Chris, MD;  Location: [facility] OR;  Service: Ophthalmology;  Laterality: Right;    POLYPECTOMY  07/22/2016   colon polyp   UPPER GI ENDOSCOPY  07/2016    Current Outpatient Medications  Medication Instructions   brimonidine (ALPHAGAN) 0.2 % ophthalmic solution 1 drop, Right Eye, 2 times daily       Objective:   Physical Exam Abdominal:       Comments: Area of reported pain.  Upon palpation is slightly TTP.  No bulging with Valsalva. No lymphadenopathies on either side  BP 108/72 (BP Location: Left Arm, Patient Position: Sitting, Cuff Size: Normal)   Pulse (!) 59   Temp 98.7 F (37.1 C) (Oral)   Resp 18   Ht 5\' 11"  (1.803 m)   Wt 157 lb 12.8 oz (71.6 kg)   SpO2 99%   BMI 22.01 kg/m  General:   Well developed, NAD, BMI noted.  HEENT:  Normocephalic . Face symmetric, atraumatic   Abdomen:  Not distended, soft, see graphic. No obvious inguinal hernias. GU: Scrotal contents normal Skin: Not pale. Not jaundice Lower extremities: no pretibial edema bilaterally  Neurologic:  alert & oriented X3.  Speech normal, gait appropriate for age and unassisted Psych--  Cognition and judgment appear intact.  Cooperative with normal attention span and concentration.  Behavior appropriate. No anxious or depressed appearing.     Assessment     Assessment H/o Anxiety H/o Palpitations 2010, saw cards, workup (-) per pt BCC nose  2016, sees derm q year L Vitreous detachment 2011 R vitreous  detachment and retinal tear 04-2022  Chronic abdominal pain: 2017- saw GI >>  EGD, Cscope, CT unrevealing. Sx resolved  w/h gluten-free diet Chronic diarrhea: Saw GI 2021 W/U-. Vegetarian  Liver cysts-- see OV 07-28-2021  PLAN: Left suprapubic/LLQ abdomen pain. Symptoms are more consistent with a MSK problem or hernia rather than intestinal/GI  related. Scrotal contents normal. Plan: For completeness we will check a UA Referred to general surgery, perhaps he has a hernia that I'm not detecting

## 2023-05-17 NOTE — Assessment & Plan Note (Signed)
Left suprapubic/LLQ abdomen pain. Symptoms are more consistent with a MSK problem or hernia rather than intestinal/GI  related. Scrotal contents normal. Plan: For completeness we will check a UA Referred to general surgery, perhaps he has a hernia that I'm not detecting

## 2023-05-17 NOTE — Patient Instructions (Signed)
Provide a urine sample  Will refer you to surgery

## 2023-05-18 LAB — URINALYSIS, ROUTINE W REFLEX MICROSCOPIC
Bilirubin Urine: NEGATIVE
Hgb urine dipstick: NEGATIVE
Ketones, ur: NEGATIVE
Leukocytes,Ua: NEGATIVE
Nitrite: NEGATIVE
RBC / HPF: NONE SEEN (ref 0–?)
Specific Gravity, Urine: 1.025 (ref 1.000–1.030)
Total Protein, Urine: NEGATIVE
Urine Glucose: NEGATIVE
Urobilinogen, UA: 0.2 (ref 0.0–1.0)
WBC, UA: NONE SEEN (ref 0–?)
pH: 6 (ref 5.0–8.0)

## 2023-06-23 ENCOUNTER — Encounter: Payer: Self-pay | Admitting: Internal Medicine

## 2023-06-23 DIAGNOSIS — M25569 Pain in unspecified knee: Secondary | ICD-10-CM

## 2023-06-26 ENCOUNTER — Encounter: Payer: Self-pay | Admitting: Family Medicine

## 2023-06-26 ENCOUNTER — Ambulatory Visit (INDEPENDENT_AMBULATORY_CARE_PROVIDER_SITE_OTHER): Payer: Medicare Other

## 2023-06-26 ENCOUNTER — Ambulatory Visit (INDEPENDENT_AMBULATORY_CARE_PROVIDER_SITE_OTHER): Payer: Medicare Other | Admitting: Family Medicine

## 2023-06-26 ENCOUNTER — Other Ambulatory Visit: Payer: Self-pay

## 2023-06-26 VITALS — BP 114/86 | HR 79 | Ht 71.0 in | Wt 157.0 lb

## 2023-06-26 DIAGNOSIS — M25562 Pain in left knee: Secondary | ICD-10-CM | POA: Diagnosis not present

## 2023-06-26 NOTE — Progress Notes (Signed)
I, [name], CMA acting as a scribe for Bradley Graham, MD.  Bradley Meyer is a 67 y.o. male who presents to [facility] at [facility] today for left knee pain. Notes "tweaking" the knee 3-4 weeks ago while playing tennis. Hx of torn meniscus with right knee scope in 2002. Pain  at medial aspect. Pain distal to patella when cycling. Denies  swelling. Intermittent mechanical sx. Has tried rest, heat, ice; heat works better than ice. Sx worse with lateral twisting motions of firmly planting the foot.    Pertinent review of systems: No fevers or chills  Relevant historical information: History of right knee meniscus tear requiring surgery about 20 years ago.   Exam:  BP 114/86   Pulse 79   Ht 5\' 11"  (1.803 m)   Wt 157 lb (71.2 kg)   SpO2 100%   BMI 21.90 kg/m  General: Well Developed, well nourished, and in no acute distress.   MSK: Left knee normal-appearing good muscle mass in quad muscle groups. Normal motion with mild crepitation retropatellar. Mildly tender palpation medial joint line. Stable ligamentous exam. Intact strength. Mildly positive medial McMurray's test.    Lab and Radiology Results  Procedure: Real-time Ultrasound Guided Injection of left knee joint superior lateral patella space Device: Philips Affiniti 50G/GE Logiq Images permanently stored and available for review in PACS Verbal informed consent obtained.  Discussed risks and benefits of procedure. Warned about infection, bleeding, hyperglycemia damage to structures among others. Patient expresses understanding and agreement Time-out conducted.   Noted no overlying erythema, induration, or other signs of local infection.   Skin prepped in a sterile fashion.   Local anesthesia: Topical Ethyl chloride.   With sterile technique and under real time ultrasound guidance: 40 mg of Kenalog and 2 mL of Marcaine injected into knee joint. Fluid seen entering the joint capsule.   Completed  without difficulty   Pain immediately resolved suggesting accurate placement of the medication.   Advised to call if fevers/chills, erythema, induration, drainage, or persistent bleeding.   Images permanently stored and available for review in the ultrasound unit.  Impression: Technically successful ultrasound guided injection.   Images left knee obtained today personally and independently interpreted Mild medial and patellofemoral DJD.  No acute fractures are visible. Await formal radiology review      Assessment and Plan: 67 y.o. male with left knee pain thought to be due to degenerative meniscus tear or medial compartment DJD exacerbation.  Plan for steroid injection Voltaren gel and physical therapy.  Recheck in about 6 weeks.  If not improved consider MRI.   PDMP not reviewed this encounter. Orders Placed This Encounter  Procedures   [place] LIMITED JOINT SPACE STRUCTURES LOW LEFT(NO LINKED CHARGES)    Order Specific Question:   Reason for Exam (SYMPTOM  OR DIAGNOSIS REQUIRED)    Answer:   left knee pain    Order Specific Question:   Preferred imaging location?    Answer:    [facility] Knee AP/LAT W/Sunrise Left    Standing Status:   Future    Number of Occurrences:   1    Standing Expiration Date:   07/26/2023    Order Specific Question:   Reason for Exam (SYMPTOM  OR DIAGNOSIS REQUIRED)    Answer:   left knee pain    Order Specific Question:   Preferred imaging location?    Answer:   [name]   Ambulatory referral to  Physical Therapy    Referral Priority:   Routine    Referral Type:   Physical Medicine    Referral Reason:   Specialty Services Required    Requested Specialty:   Physical Therapy    Number of Visits Requested:   1   No orders of the defined types were placed in this encounter.    Discussed warning signs or symptoms. Please see discharge instructions. Patient expresses understanding.   The above documentation  has been reviewed and is accurate and complete Bradley Meyer, M.D.

## 2023-06-26 NOTE — Patient Instructions (Addendum)
Thank you for coming in today.  Please get an Xray today before you leave  I've referred you to Physical Therapy.  Let us know if you don't hear from them in one week.   Please use Voltaren gel (Generic Diclofenac Gel) up to 4x daily for pain as needed.  This is available over-the-counter as both the name brand Voltaren gel and the generic diclofenac gel.   Continue compression sleeve as needed.   Recheck in 6 weeks or sooner if this is not working.

## 2023-07-10 ENCOUNTER — Encounter: Payer: Self-pay | Admitting: Family Medicine

## 2023-07-10 NOTE — Progress Notes (Signed)
Left knee x-ray shows no fractures.  You do have some arthritis underneath the kneecap.

## 2023-07-12 NOTE — Progress Notes (Signed)
[facility] - Clinic Note  07/25/2023     CHIEF COMPLAINT Patient presents for Retina Follow Up   HISTORY OF PRESENT ILLNESS: Bradley Meyer is a 67 y.o. male who presents to the clinic today for:   HPI     Retina Follow Up   In right eye.  This started 4 months ago.  Duration of 4 months.  Since onset it is stable.  I, the attending physician,  performed the HPI with the patient and updated documentation appropriately.        Comments   4 month retina follow up ERM OD pt is reporting no vision changes noticed he has been having some redness he has tried a few different gtts has not helped much he has seen some fluttering white square shape in the right eye seems to be noticed more going dark to light he is using brim bid OD       Last edited by Rennis Chris, MD on 07/26/2023  9:17 PM.     Pt states vision is doing well, he sees a "fluttering" of light that usually happens when he is transitioning from light to dark   Referring physician: Wanda Plump, MD [address] [place],  [place] [address]  HISTORICAL INFORMATION:   Selected notes from the MEDICAL RECORD NUMBER Referred by: self referral LEE:  Ocular Hx- PMH-    CURRENT MEDICATIONS: Current Outpatient Medications (Ophthalmic Drugs)  Medication Sig   brimonidine (ALPHAGAN) 0.2 % ophthalmic solution Place 1 drop into the right eye in the morning and at bedtime.   No current facility-administered medications for this visit. (Ophthalmic Drugs)   No current outpatient medications on file. (Other)   No current facility-administered medications for this visit. (Other)   REVIEW OF SYSTEMS: ROS   Positive for: Eyes Negative for: Constitutional, Gastrointestinal, Neurological, Skin, Genitourinary, Musculoskeletal, HENT, Endocrine, Cardiovascular, Respiratory, Psychiatric, Allergic/Imm, Heme/Lymph Last edited by Etheleen Mayhew, COT on 07/25/2023  1:33 PM.       ALLERGIES No Known Allergies  PAST MEDICAL HISTORY Past Medical History:  Diagnosis Date   BCC (basal cell carcinoma of skin)    nose 2006 and lower left leg   Colon polyp    GERD (gastroesophageal reflux disease)    no meds, diet controlled   History of kidney stones    passed stones   Palpitation    2010, saw [facility] (w/u per patient negative)  - Panic Attack   Vitreous detachment 05/2010   Past Surgical History:  Procedure Laterality Date   APPENDECTOMY  1971   CATARACT EXTRACTION Bilateral 10/2021   Dr. Genia Del   GAS INSERTION Right 12/22/2022   Procedure: INSERTION OF SF6 GAS;  Surgeon: Rennis Chris, MD;  Location: [facility] OR;  Service: Ophthalmology;  Laterality: Right;   KNEE ARTHROSCOPY  2002   right   MEMBRANE PEEL Right 12/22/2022   Procedure: MEMBRANE PEEL;  Surgeon: Rennis Chris, MD;  Location[facility] OR;  Service: Ophthalmology;  Laterality: Right;   NASAL SEPTUM SURGERY  05/2018   septoplasty w/ turb reduction   PARS PLANA VITRECTOMY Right 12/22/2022   Procedure: PARS PLANA VITRECTOMY WITH 25 GAUGE;  Surgeon: Rennis Chris, MD;  Location: [facility] OR;  Service: Ophthalmology;  Laterality: Right;   PHOTOCOAGULATION WITH LASER Right 12/22/2022   Procedure: PHOTOCOAGULATION WITH LASER;  Surgeon: Rennis Chris, MD;  Location: [facility] OR;  Service: Ophthalmology;  Laterality: Right;   POLYPECTOMY  07/22/2016  colon polyp   UPPER GI ENDOSCOPY  07/2016   FAMILY HISTORY Family History  Problem Relation Age of Onset   Prostate cancer Father 44   Bladder Cancer Father    CAD Other        uncle, in his 14s   Colon polyps Mother    Colon cancer Neg Hx    Stomach cancer Neg Hx    Diabetes Neg Hx    Kidney disease Neg Hx    Gallbladder disease Neg Hx    Esophageal cancer Neg Hx    Rectal cancer Neg Hx    SOCIAL HISTORY Social History   Tobacco Use   Smoking status: Former    Current packs/day: 0.00    Average packs/day: 0.1 packs/day for 12.0 years (1.2 ttl  pk-yrs)    Types: Cigarettes    Start date: 07/01/1995    Quit date: 07/01/2007    Years since quitting: 16.0   Smokeless tobacco: Never  Vaping Use   Vaping status: Never Used  Substance Use Topics   Alcohol use: Yes    Comment: Occassionally beer   Drug use: No       OPHTHALMIC EXAM:  Base Eye Exam     Visual Acuity (Snellen - Linear)       Right Left   Dist cc 20/25 20/20 -1   Dist ph cc NI          Tonometry (Tonopen, 1:43 PM)       Right Left   Pressure 14 18         Pupils       Pupils Dark Light Shape React APD   Right PERRL 4 4 Round Brisk None   Left PERRL 3 2 Round Brisk None         Visual Fields       Left Right    Full Full         Extraocular Movement       Right Left    Full, Ortho Full, Ortho         Neuro/Psych     Oriented x3: Yes   Mood/Affect: Normal         Dilation     Both eyes: 2.5% Phenylephrine @ 1:36 PM           Slit Lamp and Fundus Exam     External Exam       Right Left   External Normal Normal         Slit Lamp Exam       Right Left   Lids/Lashes Dermatochalasis - upper lid, Meibomian gland dysfunction Dermatochalasis - upper lid, mild MGD   Conjunctiva/Sclera White and quiet White and quiet   Cornea Well healed temporal cataract wound, trace PEE, trace tear film debris, trace EBMD superiorly Well healed temporal cataract wound, mild EBMD, mild arcus   Anterior Chamber Deep, 1+cell/pigment deep and clear   Iris Round and dilated Round and dilated   Lens PC IOL in good position, trace Posterior capsular opacification PC IOL in good position, 1+PCO   Anterior Vitreous post vitrectomy, gas bubble gone, trace pigment Vitreous syneresis, trace pigment, Posterior vitreous detachment, Weiss ring, vitreous condensations         Fundus Exam       Right Left   Disc mild Pallor, Sharp rim, Compact, PPA/PPP mild Pallor, 360 PPA   C/D Ratio 0.2 0.1   Macula Flat, Good foveal reflex, ERM gone,  RPE  mottling and clumping, No heme or edema Flat, Good foveal reflex, RPE mottling and clumping, No heme or edema   Vessels Attenuated, Tortuous Attenuated, Tortuous   Periphery Attached with good laser in area of previous detachments superiorly; good peripheral laser 360; PRE-OP: retinal tear at 1200, 2 retinal tears at 0130, +SRF from 1130-0200 (focal RD); shallow pocket of peripheral SRF from 0930 to 1100, good laser barricade surrounding focal RD's, Small retinal tear at 1030 ora with good laser surrounding, mild encroachment of SRF posteriorly through 1-2 rows of laser at 0130 position Attached, Pigmented laser changes at 0130 and 0800, mild pigment cystoid degeneration inferiorly; Mild patches of lattice at 0430 and 0600, pigmented CR scar at 730 - no holes, no new RT/RD           Refraction     Wearing Rx       Sphere Cylinder Axis Add   Right -1.50 +2.25 001 +2.00   Left -1.75 +2.75 178 +2.00           IMAGING AND PROCEDURES  Imaging and Procedures for 07/25/2023  OCT, Retina - OU - Both Eyes       Right Eye Quality was good. Central Foveal Thickness: 313. Progression has been stable. Findings include no IRF, no SRF, abnormal foveal contour, myopic contour (ERM gone, mild inner retinal irregularity temporal fovea, stable improvement in vitreous opacities, focal cyst temporal macula).   Left Eye Quality was good. Central Foveal Thickness: 275. Progression has been stable. Findings include normal foveal contour, no IRF, no SRF, myopic contour.   Notes *Images captured and stored on drive  Diagnosis / Impression:  OD: ERM gone, mild inner retinal irregularity temporal fovea, stable improvement in vitreous opacities, focal cyst temporal macula OS: NFP; no IRF/SRF   Clinical management:  See below  Abbreviations: NFP - Normal foveal profile. CME - cystoid macular edema. PED - pigment epithelial detachment. IRF - intraretinal fluid. SRF - subretinal fluid. EZ - ellipsoid  zone. ERM - epiretinal membrane. ORA - outer retinal atrophy. ORT - outer retinal tubulation. SRHM - subretinal hyper-reflective material. IRHM - intraretinal hyper-reflective material             ASSESSMENT/PLAN:    ICD-10-CM   1. Epiretinal membrane (ERM) of right eye  H35.371 OCT, Retina - OU - Both Eyes    2. Posterior vitreous detachment of both eyes  H43.813     3. Vitreous hemorrhage of right eye (HCC)  H43.11     4. Right retinal detachment  H33.21     5. Retinal tear of right eye  H33.311     6. History of repair of retinal tear by laser photocoagulation  Z98.890     7. Lattice degeneration of left retina  H35.412     8. Right retinoschisis  H33.101     9. Pseudophakia  Z96.1      Epiretinal membrane, right eye  - ERM w/ mild pucker -- pt had significant floaters and debris OD also - pre-op BCVA OD 20/20 - PPV/TissueBlue/MP/EL/20%SF6 gas OD (03.21.24) as below - ERM gone, OCT shows some mild inner retinal irregularity - BCVA OD 20/25 - f/u in 6-9 mos   2,3. Hemorrhagic PVD OD, PVD OS  - acute symptomatic flashes/red floaters OD, onset 06.15.23 -- pt reports improved floaters and FOL  - OS w/ old PVD w/ retinal tears in 2012 -- s/p laser retinopexy OS by JDM -- good laser in place  - VH improved but  significant old condensations persisted  - no heme s/p PPV OD -- condensations improved  4,5. Retinal tears w/ +SRF/focal peripheral retinal detachment, OD.   - horseshoe tears located 0130 (2), 1200 and 1030 - focal peripheral RDs from 1130-0200 and 0930-1100 - s/p laser retinopexy OD (07.14.23), (08.02.23) -- good barricade laser in place - PRE-OP BCVA 20/20 OD - PRE-OP exam - subretinal fluid progressed posteriorly through 1-2 rows of laser OD at 0130 position - PPV/TissueBlue/MP/EL/20%SF6 gas OD, 03.21.24 - retina stably re-attached              - IOP 14  - AC cell improved             - completed PF taper  - cont Brimonidine BID OD  - f/u 6-9 months --  DFE, OCT  6. History of RT OS  - history of PVD w/ retinal tears in 2012 - s/p laser retinopexy OS by JDM -- good laser in place  - no new RT/RD  - continue to monitor  7. Lattice degeneration OS - focal patches of pigmented lattice inferiorly (0430 and 0600) -- asymptomatic - discussed findings, prognosis, and treatment options including observation - recommend observation for now, but discussed possible prophylactic retinopexy in the future - monitor   8. Retinoschisis - myopic retionschisis noted on OCT -- temporal macula and nasal midzone  9. Pseudophakia OU  - s/p CE/IOL OU, Jan 2023 by Dr. Genia Del  - IOLs in good position, doing well  - continue to monitor  Ophthalmic Meds Ordered this visit:  No orders of the defined types were placed in this encounter.    Return for f/u 6-9 months, ERM OD, DFE, OCT.  There are no Patient Instructions on file for this visit.  This document serves as a record of services personally performed by Karie Chimera, MD, PhD. It was created on their behalf by Charlette Caffey, COT an ophthalmic technician. The creation of this record is the provider's dictation and/or activities during the visit.    Electronically signed by:  Charlette Caffey, COT  07/26/23 9:27 PM  This document serves as a record of services personally performed by Karie Chimera, MD, PhD. It was created on their behalf by Glee Arvin. Manson Passey, OA an ophthalmic technician. The creation of this record is the provider's dictation and/or activities during the visit.    Electronically signed by: Glee Arvin. Manson Passey, OA 07/26/23 9:27 PM  Karie Chimera, M.D., Ph.D. Diseases & Surgery of the Retina and Vitreous [facility]  I have reviewed the above documentation for accuracy and completeness, and I agree with the above. Karie Chimera, M.D., Ph.D. 07/26/23 9:28 PM  Abbreviations: M myopia (nearsighted); A astigmatism; H hyperopia (farsighted); P  presbyopia; Mrx spectacle prescription;  CTL contact lenses; OD right eye; OS left eye; OU both eyes  XT exotropia; ET esotropia; PEK punctate epithelial keratitis; PEE punctate epithelial erosions; DES dry eye syndrome; MGD meibomian gland dysfunction; ATs artificial tears; PFAT's preservative free artificial tears; NSC nuclear sclerotic cataract; PSC posterior subcapsular cataract; ERM epi-retinal membrane; PVD posterior vitreous detachment; RD retinal detachment; DM diabetes mellitus; DR diabetic retinopathy; NPDR non-proliferative diabetic retinopathy; PDR proliferative diabetic retinopathy; CSME clinically significant macular edema; DME diabetic macular edema; dbh dot blot hemorrhages; CWS cotton wool spot; POAG primary open angle glaucoma; C/D cup-to-disc ratio; HVF humphrey visual field; GVF goldmann visual field; OCT optical coherence tomography; IOP intraocular pressure; BRVO Branch retinal vein occlusion;  CRVO central retinal vein occlusion; CRAO central retinal artery occlusion; BRAO branch retinal artery occlusion; RT retinal tear; SB scleral buckle; PPV pars plana vitrectomy; VH Vitreous hemorrhage; PRP panretinal laser photocoagulation; IVK intravitreal kenalog; VMT vitreomacular traction; MH Macular hole;  NVD neovascularization of the disc; NVE neovascularization elsewhere; AREDS age related eye disease study; ARMD age related macular degeneration; POAG primary open angle glaucoma; EBMD epithelial/anterior basement membrane dystrophy; ACIOL anterior chamber intraocular lens; IOL intraocular lens; PCIOL posterior chamber intraocular lens; Phaco/IOL phacoemulsification with intraocular lens placement; PRK photorefractive keratectomy; LASIK laser assisted in situ keratomileusis; HTN hypertension; DM diabetes mellitus; COPD chronic obstructive pulmonary disease

## 2023-07-25 ENCOUNTER — Encounter (INDEPENDENT_AMBULATORY_CARE_PROVIDER_SITE_OTHER): Payer: Self-pay | Admitting: Ophthalmology

## 2023-07-25 ENCOUNTER — Ambulatory Visit (INDEPENDENT_AMBULATORY_CARE_PROVIDER_SITE_OTHER): Payer: Medicare Other | Admitting: Ophthalmology

## 2023-07-25 DIAGNOSIS — H3321 Serous retinal detachment, right eye: Secondary | ICD-10-CM | POA: Diagnosis not present

## 2023-07-25 DIAGNOSIS — H4311 Vitreous hemorrhage, right eye: Secondary | ICD-10-CM | POA: Diagnosis not present

## 2023-07-25 DIAGNOSIS — H43813 Vitreous degeneration, bilateral: Secondary | ICD-10-CM | POA: Diagnosis not present

## 2023-07-25 DIAGNOSIS — H33311 Horseshoe tear of retina without detachment, right eye: Secondary | ICD-10-CM

## 2023-07-25 DIAGNOSIS — H35371 Puckering of macula, right eye: Secondary | ICD-10-CM

## 2023-07-25 DIAGNOSIS — H33101 Unspecified retinoschisis, right eye: Secondary | ICD-10-CM

## 2023-07-25 DIAGNOSIS — Z961 Presence of intraocular lens: Secondary | ICD-10-CM

## 2023-07-25 DIAGNOSIS — H35412 Lattice degeneration of retina, left eye: Secondary | ICD-10-CM

## 2023-07-25 DIAGNOSIS — Z9889 Other specified postprocedural states: Secondary | ICD-10-CM

## 2023-07-26 ENCOUNTER — Encounter (INDEPENDENT_AMBULATORY_CARE_PROVIDER_SITE_OTHER): Payer: Self-pay | Admitting: Ophthalmology

## 2023-07-31 ENCOUNTER — Encounter: Payer: Self-pay | Admitting: Internal Medicine

## 2023-07-31 ENCOUNTER — Ambulatory Visit: Payer: Medicare Other | Admitting: Internal Medicine

## 2023-07-31 VITALS — BP 116/64 | HR 59 | Temp 98.1°F | Resp 16 | Ht 71.0 in | Wt 154.0 lb

## 2023-07-31 DIAGNOSIS — Z8042 Family history of malignant neoplasm of prostate: Secondary | ICD-10-CM

## 2023-07-31 DIAGNOSIS — Z23 Encounter for immunization: Secondary | ICD-10-CM | POA: Diagnosis not present

## 2023-07-31 DIAGNOSIS — Z125 Encounter for screening for malignant neoplasm of prostate: Secondary | ICD-10-CM

## 2023-07-31 DIAGNOSIS — K589 Irritable bowel syndrome without diarrhea: Secondary | ICD-10-CM | POA: Diagnosis not present

## 2023-07-31 LAB — CBC WITH DIFFERENTIAL/PLATELET
Basophils Absolute: 0.1 10*3/uL (ref 0.0–0.1)
Basophils Relative: 0.7 % (ref 0.0–3.0)
Eosinophils Absolute: 0.1 10*3/uL (ref 0.0–0.7)
Eosinophils Relative: 0.7 % (ref 0.0–5.0)
HCT: 45.1 % (ref 39.0–52.0)
Hemoglobin: 14.6 g/dL (ref 13.0–17.0)
Lymphocytes Relative: 17.8 % (ref 12.0–46.0)
Lymphs Abs: 1.6 10*3/uL (ref 0.7–4.0)
MCHC: 32.4 g/dL (ref 30.0–36.0)
MCV: 93.9 fL (ref 78.0–100.0)
Monocytes Absolute: 0.7 10*3/uL (ref 0.1–1.0)
Monocytes Relative: 7.7 % (ref 3.0–12.0)
Neutro Abs: 6.6 10*3/uL (ref 1.4–7.7)
Neutrophils Relative %: 73.1 % (ref 43.0–77.0)
Platelets: 245 10*3/uL (ref 150.0–400.0)
RBC: 4.8 Mil/uL (ref 4.22–5.81)
RDW: 13.2 % (ref 11.5–15.5)
WBC: 9.1 10*3/uL (ref 4.0–10.5)

## 2023-07-31 LAB — HEPATIC FUNCTION PANEL
ALT: 24 U/L (ref 0–53)
AST: 22 U/L (ref 0–37)
Albumin: 4.6 g/dL (ref 3.5–5.2)
Alkaline Phosphatase: 89 U/L (ref 39–117)
Bilirubin, Direct: 0.2 mg/dL (ref 0.0–0.3)
Total Bilirubin: 1 mg/dL (ref 0.2–1.2)
Total Protein: 6.8 g/dL (ref 6.0–8.3)

## 2023-07-31 LAB — PSA: PSA: 2.63 ng/mL (ref 0.10–4.00)

## 2023-07-31 MED ORDER — AMITRIPTYLINE HCL 10 MG PO TABS: 10.0000 mg | ORAL_TABLET | Freq: Every day | ORAL | 0 refills | Status: DC

## 2023-07-31 NOTE — Patient Instructions (Addendum)
Amitriptyline 10 mg: 1 tablet at bedtime, okay to increase to 2 tablets at bedtime if needed after 1 week.  GO TO THE LAB : Get the blood work     Next visit with me in 4 months for checkup Please schedule it at the front desk

## 2023-07-31 NOTE — Progress Notes (Signed)
Subjective:    Patient ID: Bradley Meyer, male    DOB: 1955-10-06, 67 y.o.   MRN: [id number]  DOS:  07/31/2023 Type of visit - description: f/u  In general feeling okay. Was evaluated by surgery for possible left inguinal hernia.  Chart reviewed.  History of chronic abdominal pain, loose stools. No nausea or vomiting.  No blood in the stools. + Weight loss per patient.   Wt Readings from Last 3 Encounters:  07/31/23 154 lb (69.9 kg)  06/26/23 157 lb (71.2 kg)  05/17/23 157 lb 12.8 oz (71.6 kg)     Review of Systems See above   Past Medical History:  Diagnosis Date   BCC (basal cell carcinoma of skin)    nose 2006 and lower left leg   Colon polyp    GERD (gastroesophageal reflux disease)    no meds, diet controlled   History of kidney stones    passed stones   Palpitation    2010, saw [facility] (w/u per patient negative)  - Panic Attack   Vitreous detachment 05/2010    Past Surgical History:  Procedure Laterality Date   APPENDECTOMY  1971   CATARACT EXTRACTION Bilateral 10/2021   Dr. Genia Del   GAS INSERTION Right 12/22/2022   Procedure: INSERTION OF SF6 GAS;  Surgeon: Rennis Chris, MD;  Location: [facility] OR;  Service: Ophthalmology;  Laterality: Right;   KNEE ARTHROSCOPY  2002   right   MEMBRANE PEEL Right 12/22/2022   Procedure: MEMBRANE PEEL;  Surgeon: Rennis Chris, MD;  Location: [facility] OR;  Service: Ophthalmology;  Laterality: Right;   NASAL SEPTUM SURGERY  05/2018   septoplasty w/ turb reduction   PARS PLANA VITRECTOMY Right 12/22/2022   Procedure: PARS PLANA VITRECTOMY WITH 25 GAUGE;  Surgeon: Rennis Chris, MD;  Location: [facility] OR;  Service: Ophthalmology;  Laterality: Right;   PHOTOCOAGULATION WITH LASER Right 12/22/2022   Procedure: PHOTOCOAGULATION WITH LASER;  Surgeon: Rennis Chris, MD;  Location: [facility] OR;  Service: Ophthalmology;  Laterality: Right;   POLYPECTOMY  07/22/2016   colon polyp   UPPER GI ENDOSCOPY  07/2016    Current Outpatient  Medications  Medication Instructions   amitriptyline (ELAVIL) 10-20 mg, Oral, Daily at bedtime   brimonidine (ALPHAGAN) 0.2 % ophthalmic solution 1 drop, Right Eye, 2 times daily       Objective:   Physical Exam BP 116/64   Pulse (!) 59   Temp 98.1 F (36.7 C) (Oral)   Resp 16   Ht 5\' 11"  (1.803 m)   Wt 154 lb (69.9 kg)   SpO2 95%   BMI 21.48 kg/m  General:   Well developed, NAD, BMI noted.  HEENT:  Normocephalic . Face symmetric, atraumatic Abdomen:  Not distended, soft, non-tender. No rebound or rigidity.   Skin: Not pale. Not jaundice Lower extremities: no pretibial edema bilaterally  Neurologic:  alert & oriented X3.  Speech normal, gait appropriate for age and unassisted Psych--  Cognition and judgment appear intact.  Cooperative with normal attention span and concentration.  Behavior appropriate. No anxious or depressed appearing.     Assessment   Assessment H/o Anxiety H/o Palpitations 2010, saw cards, workup (-) per pt BCC nose 2016, sees derm q year L Vitreous detachment 2011 R vitreous  detachment and retinal tear 04-2022  Chronic abdominal pain: 2017- saw GI >>  EGD, Cscope, CT unrevealing. Sx resolved  w/h gluten-free diet Chronic diarrhea: Saw GI 2021 W/U-. Vegetarian  Liver cysts-- see OV 07-28-2021  PLAN: L suprapubic/LLQ abdominal pain: See LOV, saw general surgery, CT abdomen was essentially negative, they ordered an abdominal MRI, reading is pending. He again complained of chronic abdominal discomfort and loose stools. Had extensive workup by GI in 2017 and  2021. D/w pt reevaluation by GI versus  treatment for possible IBS and we agreed on: Amitriptyline 10 mg then  20 mg nightly. Also check LFTs and CBC. IBS?:  See above Preventive care:  -Vaccine advice provided, got a flu shot, declines COVID or PNM 20. - Prostate cancer screening: + FH, father age 38,  has no symptoms, check PSA. RTC 4 months.

## 2023-07-31 NOTE — Assessment & Plan Note (Signed)
L suprapubic/LLQ abdominal pain: See LOV, saw general surgery, CT abdomen was essentially negative, they ordered an abdominal MRI, reading is pending. He again complained of chronic abdominal discomfort and loose stools. Had extensive workup by GI in 2017 and  2021. D/w pt reevaluation by GI versus  treatment for possible IBS and we agreed on: Amitriptyline 10 mg then  20 mg nightly. Also check LFTs and CBC. IBS?:  See above Preventive care:  -Vaccine advice provided, got a flu shot, declines COVID or PNM 20. - Prostate cancer screening: + FH, father age 65,  has no symptoms, check PSA. RTC 4 months.

## 2023-08-05 ENCOUNTER — Other Ambulatory Visit (INDEPENDENT_AMBULATORY_CARE_PROVIDER_SITE_OTHER): Payer: Self-pay | Admitting: Ophthalmology

## 2023-08-29 ENCOUNTER — Other Ambulatory Visit: Payer: Self-pay | Admitting: Internal Medicine

## 2023-09-14 DIAGNOSIS — K4090 Unilateral inguinal hernia, without obstruction or gangrene, not specified as recurrent: Secondary | ICD-10-CM | POA: Insufficient documentation

## 2023-10-09 ENCOUNTER — Ambulatory Visit (INDEPENDENT_AMBULATORY_CARE_PROVIDER_SITE_OTHER): Payer: Medicare Other | Admitting: Internal Medicine

## 2023-10-09 VITALS — BP 122/60 | HR 66 | Temp 98.0°F | Resp 16 | Ht 71.0 in | Wt 159.1 lb

## 2023-10-09 DIAGNOSIS — H9202 Otalgia, left ear: Secondary | ICD-10-CM | POA: Diagnosis not present

## 2023-10-09 DIAGNOSIS — B349 Viral infection, unspecified: Secondary | ICD-10-CM

## 2023-10-09 MED ORDER — AMOXICILLIN 875 MG PO TABS: 875.0000 mg | ORAL_TABLET | Freq: Two times a day (BID) | ORAL | 0 refills | Status: DC

## 2023-10-09 NOTE — Assessment & Plan Note (Signed)
 Viral syndrome, L otalgia: he probably had a viral syndrome the last week of December, now with remaining cough and left ear discomfort (feels clogged). Plan: Astepro, Mucinex, avoid oral decongestants, if not gradually better start an antibiotic.  See AVS. IBS?  See LOV, decided not to take amitriptyline .  Has seen general surgery and is scheduled to have hernia repair. RTC   scheduled for next month

## 2023-10-09 NOTE — Progress Notes (Signed)
 Subjective:    Patient ID: Bradley Meyer, male    DOB: 1956-05-02, 68 y.o.   MRN: [id number]  DOS:  10/09/2023 Type of visit - description: Acute  Around December 25, he  started with a head cold described as sinus congestion, cough with some sputum production, fatigue. Stayed in bed several days. At this point he is better, no fever or chills. Is here because he still has some left ear discomfort and mild cough.   Review of Systems See above   Past Medical History:  Diagnosis Date   BCC (basal cell carcinoma of skin)    nose 2006 and lower left leg   Colon polyp    GERD (gastroesophageal reflux disease)    no meds, diet controlled   History of kidney stones    passed stones   Palpitation    2010, saw [facility] (w/u per patient negative)  - Panic Attack   Vitreous detachment 05/2010    Past Surgical History:  Procedure Laterality Date   APPENDECTOMY  1971   CATARACT EXTRACTION Bilateral 10/2021   Dr. Regenia   GAS INSERTION Right 12/22/2022   Procedure: INSERTION OF SF6 GAS;  Surgeon: Valdemar Rogue, MD;  Location: [facility] OR;  Service: Ophthalmology;  Laterality: Right;   KNEE ARTHROSCOPY  2002   right   MEMBRANE PEEL Right 12/22/2022   Procedure: MEMBRANE PEEL;  Surgeon: Valdemar Rogue, MD;  Location: [facility] OR;  Service: Ophthalmology;  Laterality: Right;   NASAL SEPTUM SURGERY  05/2018   septoplasty w/ turb reduction   PARS PLANA VITRECTOMY Right 12/22/2022   Procedure: PARS PLANA VITRECTOMY WITH 25 GAUGE;  Surgeon: Valdemar Rogue, MD;  Location: [facility] OR;  Service: Ophthalmology;  Laterality: Right;   PHOTOCOAGULATION WITH LASER Right 12/22/2022   Procedure: PHOTOCOAGULATION WITH LASER;  Surgeon: Valdemar Rogue, MD;  Location: [facility] OR;  Service: Ophthalmology;  Laterality: Right;   POLYPECTOMY  07/22/2016   colon polyp   UPPER GI ENDOSCOPY  07/2016    Current Outpatient Medications  Medication Instructions   amitriptyline  (ELAVIL ) 10-20 mg, Oral, Daily at bedtime    brimonidine  (ALPHAGAN ) 0.2 % ophthalmic solution INSTILL 1 DROP IN RIGHT EYE IN THE MORNING AND AT BEDTIME       Objective:   Physical Exam BP 122/60   Pulse 66   Temp 98 F (36.7 C) (Oral)   Resp 16   Ht 5' 11 (1.803 m)   Wt 159 lb 2 oz (72.2 kg)   SpO2 97%   BMI 22.19 kg/m  General:   Well developed, NAD, BMI noted. HEENT:  Normocephalic . Face symmetric, atraumatic R TM, he had some wax, it was removed with a spoon, TM normal L TM: Slightly red, no bulge.  Canal normal. Throat: Symmetric, not red. Nose: Slightly congested Lungs:  CTA B Normal respiratory effort, no intercostal retractions, no accessory muscle use. Heart: RRR,  no murmur.  Lower extremities: no pretibial edema bilaterally  Skin: Not pale. Not jaundice Neurologic:  alert & oriented X3.  Speech normal, gait appropriate for age and unassisted Psych--  Cognition and judgment appear intact.  Cooperative with normal attention span and concentration.  Behavior appropriate. No anxious or depressed appearing.      Assessment     Assessment H/o Anxiety H/o Palpitations 2010, saw cards, workup (-) per pt BCC nose 2016, sees derm q year L Vitreous detachment 2011 R vitreous  detachment and retinal tear 04-2022  Chronic abdominal pain: 2017- saw GI >>  EGD, Cscope, CT unrevealing. Sx resolved  w/h gluten-free diet Chronic diarrhea: Saw GI 2021 W/U-. Vegetarian  Liver cysts-- see OV 07-28-2021  PLAN: Viral syndrome, L otalgia: he probably had a viral syndrome the last week of December, now with remaining cough and left ear discomfort (feels clogged). Plan: Astepro, Mucinex, avoid oral decongestants, if not gradually better start an antibiotic.  See AVS. IBS?  See LOV, decided not to take amitriptyline .  Has seen general surgery and is scheduled to have hernia repair. RTC   scheduled for next month

## 2023-10-09 NOTE — Patient Instructions (Signed)
   For cough:  Take Mucinex DM or Robitussin-DM OTC.  Follow the instructions in the box.   For nasal congestion:    -Use OTC Astepro 2 nasal sprays on each side of the nose twice daily until better  Avoid decongestants such as  Pseudoephedrine or phenylephrine     If you are not gradually better in the next 10 to 14 days, then start the antibiotic called amoxicillin .  Call anytime if the symptoms are severe, you have high fever, short of breath, chest pain

## 2023-12-01 ENCOUNTER — Ambulatory Visit: Payer: Medicare Other | Admitting: Internal Medicine

## 2024-01-10 ENCOUNTER — Encounter: Payer: Self-pay | Admitting: Internal Medicine

## 2024-02-09 NOTE — Progress Notes (Signed)
 [facility] - Clinic Note  02/20/2024     CHIEF COMPLAINT Patient presents for Retina Follow Up   HISTORY OF PRESENT ILLNESS: Bradley Meyer is a 68 y.o. male who presents to the clinic today for:   HPI     Retina Follow Up   In right eye.  This started 6 months ago.  Duration of 6 months.  Since onset it is stable.  I, the attending physician,  performed the HPI with the patient and updated documentation appropriately.        Comments   Patient states the vision is doing well. He states that he is seeing a distortion in vision out of his left eye. He had a YAG OS about two months ago. He is using AT's.      Last edited by Ronelle Coffee, MD on 02/24/2024 12:56 AM.    Pt states his vision is stable, he sees a little distortion in vision out of his left eye, he had YAG OS with Dr. Alto Atta  Referring physician: Ezell Hollow, MD [address] [place],  [place] [address]  HISTORICAL INFORMATION:   Selected notes from the MEDICAL RECORD NUMBER Referred by: self referral LEE:  Ocular Hx- PMH-    CURRENT MEDICATIONS: No current outpatient medications on file. (Ophthalmic Drugs)   No current facility-administered medications for this visit. (Ophthalmic Drugs)   Current Outpatient Medications (Other)  Medication Sig   amoxicillin  (AMOXIL ) 875 MG tablet Take 1 tablet (875 mg total) by mouth 2 (two) times daily.   No current facility-administered medications for this visit. (Other)   REVIEW OF SYSTEMS: ROS   Positive for: Eyes Negative for: Constitutional, Gastrointestinal, Neurological, Skin, Genitourinary, Musculoskeletal, HENT, Endocrine, Cardiovascular, Respiratory, Psychiatric, Allergic/Imm, Heme/Lymph Last edited by Olene Berne, COT on 02/20/2024  1:26 PM.     ALLERGIES No Known Allergies  PAST MEDICAL HISTORY Past Medical History:  Diagnosis Date   BCC (basal cell carcinoma of skin)    nose 2006 and lower left leg    Colon polyp    GERD (gastroesophageal reflux disease)    no meds, diet controlled   History of kidney stones    passed stones   Palpitation    2010, saw [facility] (w/u per patient negative)  - Panic Attack   Vitreous detachment 05/2010   Past Surgical History:  Procedure Laterality Date   APPENDECTOMY  1971   CATARACT EXTRACTION Bilateral 10/2021   Dr. Alto Atta   GAS INSERTION Right 12/22/2022   Procedure: INSERTION OF SF6 GAS;  Surgeon: Ronelle Coffee, MD;  Location: [facility] OR;  Service: Ophthalmology;  Laterality: Right;   KNEE ARTHROSCOPY  2002   right   MEMBRANE PEEL Right 12/22/2022   Procedure: MEMBRANE PEEL;  Surgeon: Ronelle Coffee, MD;  Location: [facility] OR;  Service: Ophthalmology;  Laterality: Right;   NASAL SEPTUM SURGERY  05/2018   septoplasty w/ turb reduction   PARS PLANA VITRECTOMY Right 12/22/2022   Procedure: PARS PLANA VITRECTOMY WITH 25 GAUGE;  Surgeon: Ronelle Coffee, MD;  Location: [facility] OR;  Service: Ophthalmology;  Laterality: Right;   PHOTOCOAGULATION WITH LASER Right 12/22/2022   Procedure: PHOTOCOAGULATION WITH LASER;  Surgeon: Ronelle Coffee, MD;  Location: [facility] OR;  Service: Ophthalmology;  Laterality: Right;   POLYPECTOMY  07/22/2016   colon polyp   UPPER GI ENDOSCOPY  07/2016   FAMILY HISTORY Family History  Problem Relation Age of Onset   Prostate cancer Father 52  Bladder Cancer Father    CAD Other        uncle, in his 36s   Colon polyps Mother    Colon cancer Neg Hx    Stomach cancer Neg Hx    Diabetes Neg Hx    Kidney disease Neg Hx    Gallbladder disease Neg Hx    Esophageal cancer Neg Hx    Rectal cancer Neg Hx    SOCIAL HISTORY Social History   Tobacco Use   Smoking status: Former    Current packs/day: 0.00    Average packs/day: 0.1 packs/day for 12.0 years (1.2 ttl pk-yrs)    Types: Cigarettes    Start date: 07/01/1995    Quit date: 07/01/2007    Years since quitting: 16.6   Smokeless tobacco: Never  Vaping Use   Vaping status:  Never Used  Substance Use Topics   Alcohol use: Yes    Comment: Occassionally beer   Drug use: No       OPHTHALMIC EXAM:  Base Eye Exam     Visual Acuity (Snellen - Linear)       Right Left   Dist cc 20/20 20/20    Correction: Glasses         Tonometry (Tonopen, 1:28 PM)       Right Left   Pressure 16 18         Pupils       Dark Light Shape React APD   Right 4 4 Round Minimal None   Left 5 4 Round Slow None         Visual Fields       Left Right    Full          Extraocular Movement       Right Left    Full, Ortho Full, Ortho         Neuro/Psych     Oriented x3: Yes   Mood/Affect: Normal         Dilation     Both eyes: 1.0% Mydriacyl , 2.5% Phenylephrine  @ 1:26 PM           Slit Lamp and Fundus Exam     External Exam       Right Left   External Normal Normal         Slit Lamp Exam       Right Left   Lids/Lashes Dermatochalasis - upper lid, mild MGD Dermatochalasis - upper lid, mild MGD   Conjunctiva/Sclera White and quiet White and quiet   Cornea Well healed temporal cataract wound, trace EBMD Well healed temporal cataract wound, mild EBMD, mild arcus, trace PEE   Anterior Chamber deep and clear deep and clear   Iris Round and dilated Round and dilated   Lens PC IOL in good position, trace Posterior capsular opacification PC IOL in good position with open PC   Anterior Vitreous post vitrectomy Vitreous syneresis, Posterior vitreous detachment, Weiss ring, vitreous condensations         Fundus Exam       Right Left   Disc Pink and sharp, Compact, PPA/PPP Trace pallor, sharp rim, 360 PPA   C/D Ratio 0.2 0.1   Macula Flat, Good foveal reflex, ERM gone, RPE mottling and clumping, No heme or edema Flat, Good foveal reflex, RPE mottling and clumping, No heme or edema   Vessels Attenuated, mild tortuosity Attenuated, mild tortuosity   Periphery Attached with good laser in area of previous detachments superiorly; good  peripheral laser 360; PRE-OP: retinal tear at 1200, 2 retinal tears at 0130, +SRF from 1130-0200 (focal RD); shallow pocket of peripheral SRF from 0930 to 1100, good laser barricade surrounding focal RD's, Small retinal tear at 1030 ora with good laser surrounding, mild encroachment of SRF posteriorly through 1-2 rows of laser at 0130 position Attached, Pigmented laser changes at 0130 and 0800, mild pigment cystoid degeneration inferiorly; Mild patches of lattice at 0430 and 0600, pigmented CR scar at 730 - no holes, no new RT/RD           Refraction     Wearing Rx       Sphere Cylinder Axis Add   Right -1.50 +2.25 001 +2.00   Left -1.75 +2.75 178 +2.00           IMAGING AND PROCEDURES  Imaging and Procedures for 02/20/2024  OCT, Retina - OU - Both Eyes        Right Eye Quality was good. Central Foveal Thickness: 295. Progression has been stable. Findings include no IRF, no SRF, abnormal foveal contour, myopic contour (ERM gone, mild inner retinal irregularity temporal fovea, stable improvement in vitreous opacities).   Left Eye Quality was good. Central Foveal Thickness: 275. Progression has been stable. Findings include normal foveal contour, no IRF, no SRF, myopic contour.   Notes  *Images captured and stored on drive  Diagnosis / Impression:  OD: ERM gone, mild inner retinal irregularity temporal fovea, stable improvement in vitreous opacities OS: NFP; no IRF/SRF   Clinical management:  See below  Abbreviations: NFP - Normal foveal profile. CME - cystoid macular edema. PED - pigment epithelial detachment. IRF - intraretinal fluid. SRF - subretinal fluid. EZ - ellipsoid zone. ERM - epiretinal membrane. ORA - outer retinal atrophy. ORT - outer retinal tubulation. SRHM - subretinal hyper-reflective material. IRHM - intraretinal hyper-reflective material            ASSESSMENT/PLAN:    ICD-10-CM   1. Epiretinal membrane (ERM) of right eye  H35.371 OCT, Retina - OU  - Both Eyes    2. Posterior vitreous detachment of both eyes  H43.813     3. Vitreous hemorrhage of right eye (HCC)  H43.11     4. Right retinal detachment  H33.21     5. Retinal tear of right eye  H33.311     6. History of repair of retinal tear by laser photocoagulation  Z98.890     7. Lattice degeneration of left retina  H35.412     8. Right retinoschisis  H33.101     9. Pseudophakia  Z96.1      Epiretinal membrane, right eye  - ERM w/ mild pucker -- pt had significant floaters and debris OD also - pre-op BCVA OD 20/20 - PPV/TissueBlue /MP/EL/20%SF6 gas OD (03.21.24) as below - ERM gone, OCT shows some mild inner retinal irregularity - BCVA OD 20/20 - f/u in 1 yr - DFE/OCT   2,3. Hemorrhagic PVD OD, PVD OS  - acute symptomatic flashes/red floaters OD, onset 06.15.23 -- pt reports improved floaters and FOL  - OS w/ old PVD w/ retinal tears in 2012 -- s/p laser retinopexy OS by JDM -- good laser in place  - VH improved but significant old condensations persisted  - no heme s/p PPV OD -- condensations improved  4,5. Retinal tears w/ +SRF/focal peripheral retinal detachment, OD.   - horseshoe tears located 0130 (2), 1200 and 1030 - focal peripheral RDs from 1130-0200 and 0930-1100 - s/p  laser retinopexy OD (07.14.23), (08.02.23) -- good barricade laser in place - PRE-OP BCVA 20/20 OD - PRE-OP exam - subretinal fluid progressed posteriorly through 1-2 rows of laser OD at 0130 position - PPV/TissueBlue /MP/EL/20%SF6 gas OD, 03.21.24 - retina stably re-attached              - IOP 16  - AC cell improved             - off all drops  - f/u 12 months -- DFE, OCT  6. History of RT OS  - history of PVD w/ retinal tears in 2012 - s/p laser retinopexy OS by JDM -- good laser in place  - no new RT/RD  - continue to monitor  7. Lattice degeneration OS - focal patches of pigmented lattice inferiorly (0430 and 0600) -- asymptomatic - discussed findings, prognosis, and treatment  options including observation - recommend observation for now, but discussed possible prophylactic retinopexy in the future - monitor   8. Retinoschisis - myopic retionschisis noted on OCT -- temporal macula and nasal midzone  9. Pseudophakia OU  - s/p CE/IOL OU, Jan 2023 by Dr. Alto Atta  - IOLs in good position, doing well  - continue to monitor  Ophthalmic Meds Ordered this visit:  No orders of the defined types were placed in this encounter.    Return in about 1 year (around 02/19/2025) for f/u ERM OD, DFE, OCT.  There are no Patient Instructions on file for this visit.  This document serves as a record of services personally performed by Jeanice Millard, MD, PhD. It was created on their behalf by Olene Berne, COT an ophthalmic technician. The creation of this record is the provider's dictation and/or activities during the visit.    Electronically signed by:  Olene Berne, COT  02/24/24 12:57 AM  This document serves as a record of services personally performed by Jeanice Millard, MD, PhD. It was created on their behalf by Morley Arabia. Bevin Bucks, OA an ophthalmic technician. The creation of this record is the provider's dictation and/or activities during the visit.    Electronically signed by: Morley Arabia. Bevin Bucks, OA 02/24/24 12:57 AM  Jeanice Millard, M.D., Ph.D. Diseases & Surgery of the Retina and Vitreous [facility]  I have reviewed the above documentation for accuracy and completeness, and I agree with the above. Jeanice Millard, M.D., Ph.D. 02/24/24 1:00 AM   Abbreviations: M myopia (nearsighted); A astigmatism; H hyperopia (farsighted); P presbyopia; Mrx spectacle prescription;  CTL contact lenses; OD right eye; OS left eye; OU both eyes  XT exotropia; ET esotropia; PEK punctate epithelial keratitis; PEE punctate epithelial erosions; DES dry eye syndrome; MGD meibomian gland dysfunction; ATs artificial tears; PFAT's preservative free artificial  tears; NSC nuclear sclerotic cataract; PSC posterior subcapsular cataract; ERM epi-retinal membrane; PVD posterior vitreous detachment; RD retinal detachment; DM diabetes mellitus; DR diabetic retinopathy; NPDR non-proliferative diabetic retinopathy; PDR proliferative diabetic retinopathy; CSME clinically significant macular edema; DME diabetic macular edema; dbh dot blot hemorrhages; CWS cotton wool spot; POAG primary open angle glaucoma; C/D cup-to-disc ratio; HVF humphrey visual field; GVF goldmann visual field; OCT optical coherence tomography; IOP intraocular pressure; BRVO Branch retinal vein occlusion; CRVO central retinal vein occlusion; CRAO central retinal artery occlusion; BRAO branch retinal artery occlusion; RT retinal tear; SB scleral buckle; PPV pars plana vitrectomy; VH Vitreous hemorrhage; PRP panretinal laser photocoagulation; IVK intravitreal kenalog ; VMT vitreomacular traction; MH Macular hole;  NVD neovascularization of the  disc; NVE neovascularization elsewhere; AREDS age related eye disease study; ARMD age related macular degeneration; POAG primary open angle glaucoma; EBMD epithelial/anterior basement membrane dystrophy; ACIOL anterior chamber intraocular lens; IOL intraocular lens; PCIOL posterior chamber intraocular lens; Phaco/IOL phacoemulsification with intraocular lens placement; PRK photorefractive keratectomy; LASIK laser assisted in situ keratomileusis; HTN hypertension; DM diabetes mellitus; COPD chronic obstructive pulmonary disease

## 2024-02-20 ENCOUNTER — Ambulatory Visit (INDEPENDENT_AMBULATORY_CARE_PROVIDER_SITE_OTHER): Payer: Medicare Other | Admitting: Ophthalmology

## 2024-02-20 DIAGNOSIS — Z961 Presence of intraocular lens: Secondary | ICD-10-CM

## 2024-02-20 DIAGNOSIS — H3321 Serous retinal detachment, right eye: Secondary | ICD-10-CM | POA: Diagnosis not present

## 2024-02-20 DIAGNOSIS — H43813 Vitreous degeneration, bilateral: Secondary | ICD-10-CM | POA: Diagnosis not present

## 2024-02-20 DIAGNOSIS — H35371 Puckering of macula, right eye: Secondary | ICD-10-CM

## 2024-02-20 DIAGNOSIS — H4311 Vitreous hemorrhage, right eye: Secondary | ICD-10-CM | POA: Diagnosis not present

## 2024-02-20 DIAGNOSIS — H33311 Horseshoe tear of retina without detachment, right eye: Secondary | ICD-10-CM

## 2024-02-20 DIAGNOSIS — Z9889 Other specified postprocedural states: Secondary | ICD-10-CM

## 2024-02-20 DIAGNOSIS — H35412 Lattice degeneration of retina, left eye: Secondary | ICD-10-CM

## 2024-02-20 DIAGNOSIS — H33101 Unspecified retinoschisis, right eye: Secondary | ICD-10-CM

## 2024-02-24 ENCOUNTER — Encounter (INDEPENDENT_AMBULATORY_CARE_PROVIDER_SITE_OTHER): Payer: Self-pay | Admitting: Ophthalmology

## 2024-04-17 ENCOUNTER — Ambulatory Visit

## 2024-04-17 VITALS — BP 122/60 | Ht 71.0 in | Wt 155.0 lb

## 2024-04-17 DIAGNOSIS — Z0000 Encounter for general adult medical examination without abnormal findings: Secondary | ICD-10-CM | POA: Diagnosis not present

## 2024-04-17 NOTE — Progress Notes (Signed)
 Because this visit was a virtual/telehealth visit,  certain criteria was not obtained, such a blood pressure, CBG if applicable, and timed get up and go. Any medications not marked as taking were not mentioned during the medication reconciliation part of the visit. Any vitals not documented were not able to be obtained due to this being a telehealth visit or patient was unable to self-report a recent blood pressure reading due to a lack of equipment at home via telehealth. Vitals that have been documented are verbally provided by the patient.   This visit was performed by a medical professional under my direct supervision. I was immediately available for consultation/collaboration. I have reviewed and agree with the Annual Wellness Visit documentation.  Subjective:   Bradley Meyer is a 68 y.o. who presents for a Medicare Wellness preventive visit.  As a reminder, Annual Wellness Visits don't include a physical exam, and some assessments may be limited, especially if this visit is performed virtually. We may recommend an in-person follow-up visit with your provider if needed.  Visit Complete: Virtual I connected with  Bradley Meyer on 04/17/24 by a audio enabled telemedicine application and verified that I am speaking with the correct person using two identifiers.  Patient Location: Home  Provider Location: Home Office  I discussed the limitations of evaluation and management by telemedicine. The patient expressed understanding and agreed to proceed.  Vital Signs: Because this visit was a virtual/telehealth visit, some criteria may be missing or patient reported. Any vitals not documented were not able to be obtained and vitals that have been documented are patient reported.  VideoDeclined- This patient declined Librarian, academic. Therefore the visit was completed with audio only.  Persons Participating in Visit: Patient.  AWV Questionnaire: Yes:  Patient Medicare AWV questionnaire was completed by the patient on 04/16/2024; I have confirmed that all information answered by patient is correct and no changes since this date.  Cardiac Risk Factors include: advanced age (>12men, >41 women);male gender     Objective:    Today's Vitals   04/17/24 0936  BP: 122/60  Weight: 155 lb (70.3 kg)  Height: 5' 11 (1.803 m)   Body mass index is 21.62 kg/m.     04/17/2024    9:39 AM 07/29/2016    1:17 PM  Advanced Directives  Does Patient Have a Medical Advance Directive? No No   Would patient like information on creating a medical advance directive? No - Patient declined      Data saved with a previous flowsheet row definition    Current Medications (verified) Outpatient Encounter Medications as of 04/17/2024  Medication Sig   amoxicillin  (AMOXIL ) 875 MG tablet Take 1 tablet (875 mg total) by mouth 2 (two) times daily.   No facility-administered encounter medications on file as of 04/17/2024.    Allergies (verified) Patient has no known allergies.   History: Past Medical History:  Diagnosis Date   BCC (basal cell carcinoma of skin)    nose 2006 and lower left leg   Colon polyp    GERD (gastroesophageal reflux disease)    no meds, diet controlled   History of kidney stones    passed stones   Palpitation    2010, saw [facility] (w/u per patient negative)  - Panic Attack   Vitreous detachment 05/2010   Past Surgical History:  Procedure Laterality Date   APPENDECTOMY  1971   CATARACT EXTRACTION Bilateral 10/2021   Dr. Regenia   GAS INSERTION  Right 12/22/2022   Procedure: INSERTION OF SF6 GAS;  Surgeon: Valdemar Rogue, MD;  Location: [facility] OR;  Service: Ophthalmology;  Laterality: Right;   KNEE ARTHROSCOPY  2002   right   MEMBRANE PEEL Right 12/22/2022   Procedure: MEMBRANE PEEL;  Surgeon: Valdemar Rogue, MD;  Location: [facility] OR;  Service: Ophthalmology;  Laterality: Right;   NASAL SEPTUM SURGERY  05/2018   septoplasty w/  turb reduction   PARS PLANA VITRECTOMY Right 12/22/2022   Procedure: PARS PLANA VITRECTOMY WITH 25 GAUGE;  Surgeon: Valdemar Rogue, MD;  Location: [facility] OR;  Service: Ophthalmology;  Laterality: Right;   PHOTOCOAGULATION WITH LASER Right 12/22/2022   Procedure: PHOTOCOAGULATION WITH LASER;  Surgeon: Valdemar Rogue, MD;  Location: [facility] OR;  Service: Ophthalmology;  Laterality: Right;   POLYPECTOMY  07/22/2016   colon polyp   UPPER GI ENDOSCOPY  07/2016   Family History  Problem Relation Age of Onset   Prostate cancer Father 42   Bladder Cancer Father    CAD Other        uncle, in his 46s   Colon polyps Mother    Colon cancer Neg Hx    Stomach cancer Neg Hx    Diabetes Neg Hx    Kidney disease Neg Hx    Gallbladder disease Neg Hx    Esophageal cancer Neg Hx    Rectal cancer Neg Hx    Social History   Socioeconomic History   Marital status: Married    Spouse name: Not on file   Number of children: 0   Years of education: Not on file   Highest education level: Bachelor's degree (e.g., BA, AB, BS)  Occupational History   Occupation: retired Scientific laboratory technician , Musician   Occupation: works from home   Tobacco Use   Smoking status: Former    Current packs/day: 0.00    Average packs/day: 0.1 packs/day for 12.0 years (1.2 ttl pk-yrs)    Types: Cigarettes    Start date: 07/01/1995    Quit date: 07/01/2007    Years since quitting: 16.8   Smokeless tobacco: Never  Vaping Use   Vaping status: Never Used  Substance and Sexual Activity   Alcohol use: Yes    Comment: Occassionally beer   Drug use: No   Sexual activity: Yes  Other Topics Concern   Not on file  Social History Narrative   moved here from [place] 10-2008     Occupation: Acupuncturist     Married , no children         Social Drivers of Corporate investment banker Strain: Low Risk  (04/16/2024)   Overall Financial Resource Strain (CARDIA)    Difficulty of Paying Living Expenses: Not hard at all  Food  Insecurity: No Food Insecurity (04/16/2024)   Hunger Vital Sign    Worried About Running Out of Food in the Last Year: Never true    Ran Out of Food in the Last Year: Never true  Transportation Needs: No Transportation Needs (04/16/2024)   PRAPARE - Administrator, Civil Service (Medical): No    Lack of Transportation (Non-Medical): No  Physical Activity: Sufficiently Active (04/16/2024)   Exercise Vital Sign    Days of Exercise per Week: 7 days    Minutes of Exercise per Session: 90 min  Stress: No Stress Concern Present (04/16/2024)   Bradley Meyer of Occupational Health - Occupational Stress Questionnaire    Feeling of Stress: Not at all  Social Connections: Moderately  Integrated (04/16/2024)   Social Connection and Isolation Panel    Frequency of Communication with Friends and Family: Twice a week    Frequency of Social Gatherings with Friends and Family: Once a week    Attends Religious Services: Never    Database administrator or Organizations: Yes    Attends Engineer, structural: More than 4 times per year    Marital Status: Married    Tobacco Counseling Counseling given: Not Answered    Clinical Intake:  Pre-visit preparation completed: Yes  Pain : No/denies pain     BMI - recorded: 21.62 Nutritional Status: BMI of 19-24  Normal Nutritional Risks: None Diabetes: No  Lab Results  Component Value Date   HGBA1C 5.5 12/16/2022   HGBA1C 5.6 01/21/2022   HGBA1C 5.5 07/23/2021     How often do you need to have someone help you when you read instructions, pamphlets, or other written materials from your doctor or pharmacy?: 1 - Never  Interpreter Needed?: No  Information entered by :: Bradley Meyer,cma   Activities of Daily Living     04/16/2024   10:05 PM  In your present state of health, do you have any difficulty performing the following activities:  Hearing? 0  Vision? 0  Difficulty concentrating or making decisions? 0  Walking  or climbing stairs? 0  Dressing or bathing? 0  Doing errands, shopping? 0  Preparing Food and eating ? N  Using the Toilet? N  In the past six months, have you accidently leaked urine? N  Do you have problems with loss of bowel control? N  Managing your Medications? N  Managing your Finances? N  Housekeeping or managing your Housekeeping? N    Patient Care Team: Amon Aloysius BRAVO, MD as PCP - [facility] (Specialist)  I have updated your Care Teams any recent Medical Services you may have received from other providers in the past year.     Assessment:   This is a routine wellness examination for Bradley Meyer.  Hearing/Vision screen Hearing Screening - Comments:: No difficulties Vision Screening - Comments:: Pt wears glasses   Goals Addressed             This Visit's Progress    Patient Stated       Patient would like to maintain happy life style       Depression Screen     04/17/2024    9:40 AM 10/09/2023    1:58 PM 07/31/2023    1:05 PM 02/20/2023    8:56 AM 12/16/2022    3:32 PM 07/27/2022    9:06 AM 07/23/2021    9:00 AM  PHQ 2/9 Scores  PHQ - 2 Score 0 0 0 0 0 0 0  PHQ- 9 Score 0          Fall Risk     04/16/2024   10:05 PM 10/09/2023    1:58 PM 07/31/2023    1:05 PM 02/20/2023    8:56 AM 12/16/2022    3:32 PM  Fall Risk   Falls in the past year? 0 0 0 0 0  Number falls in past yr: 0 0 0 0 0  Injury with Fall? 0 0 0 0 0  Risk for fall due to : No Fall Risks      Follow up Falls evaluation completed Falls evaluation completed;Education provided Falls evaluation completed Falls evaluation completed Falls evaluation completed    MEDICARE RISK AT HOME:  Medicare Risk at  Home Any stairs in or around the home?: (Patient-Rptd) Yes If so, are there any without handrails?: (Patient-Rptd) No Home free of loose throw rugs in walkways, pet beds, electrical cords, etc?: (Patient-Rptd) No Adequate lighting in your home to reduce risk of falls?: (Patient-Rptd)  Yes Life alert?: (Patient-Rptd) No Use of a cane, walker or w/c?: (Patient-Rptd) No Grab bars in the bathroom?: (Patient-Rptd) No Shower chair or bench in shower?: (Patient-Rptd) No Elevated toilet seat or a handicapped toilet?: (Patient-Rptd) No  TIMED UP AND GO:  Was the test performed?  No  Cognitive Function: 6CIT completed        04/17/2024    9:38 AM  6CIT Screen  What Year? 0 points  What month? 0 points  What time? 0 points  Count back from 20 0 points  Months in reverse 0 points  Repeat phrase 0 points  Total Score 0 points    Immunizations Immunization History  Administered Date(s) Administered   Fluad Trivalent(High Dose 65+) 07/31/2023   Influenza Inj Mdck Quad Pf 07/13/2019   Influenza Whole 07/23/2010   Influenza,inj,Quad PF,6+ Mos 07/01/2014, 06/14/2016, 07/23/2020, 07/23/2021, 07/27/2022   Influenza-Unspecified 09/02/2017, 07/03/2018   PFIZER(Purple Top)SARS-COV-2 Vaccination 12/10/2019, 01/11/2020, 09/23/2020   Td 10/03/2004   Tdap 11/04/2014   Zoster Recombinant(Shingrix ) 07/23/2019, 09/24/2019    Screening Tests Health Maintenance  Topic Date Due   Pneumococcal Vaccine: 50+ Years (1 of 1 - PCV) 07/30/2024 (Originally 04/07/2008)   INFLUENZA VACCINE  05/03/2024   DTaP/Tdap/Td (3 - Td or Tdap) 11/04/2024   Medicare Annual Wellness (AWV)  04/17/2025   Colonoscopy  07/29/2026   Hepatitis C Screening  Completed   Zoster Vaccines- Shingrix   Completed   Hepatitis B Vaccines  Aged Out   HPV VACCINES  Aged Out   Meningococcal B Vaccine  Aged Out   COVID-19 Vaccine  Discontinued    Health Maintenance  There are no preventive care reminders to display for this patient. Health Maintenance Items Addressed:nothing needed  Additional Screening:  Vision Screening: Recommended annual ophthalmology exams for early detection of glaucoma and other disorders of the eye. Would you like a referral to an eye doctor? No    Dental Screening: Recommended annual  dental exams for proper oral hygiene  Community Resource Referral / Chronic Care Management: CRR required this visit?  No   CCM required this visit?  No   Plan:    I have personally reviewed and noted the following in the patient's chart:   Medical and social history Use of alcohol, tobacco or illicit drugs  Current medications and supplements including opioid prescriptions. Patient is not currently taking opioid prescriptions. Functional ability and status Nutritional status Physical activity Advanced directives List of other physicians Hospitalizations, surgeries, and ER visits in previous 12 months Vitals Screenings to include cognitive, depression, and falls Referrals and appointments  In addition, I have reviewed and discussed with patient certain preventive protocols, quality metrics, and best practice recommendations. A written personalized care plan for preventive services as well as general preventive health recommendations were provided to patient.   Lyle MARLA Right, [place]   04/17/2024   After Visit Summary: (MyChart) Due to this being a telephonic visit, the after visit summary with patients personalized plan was offered to patient via MyChart   Notes: Nothing significant to report at this time.

## 2024-04-17 NOTE — Patient Instructions (Signed)
 Mr. Bradley Meyer , Thank you for taking time out of your busy schedule to complete your Annual Wellness Visit with me. I enjoyed our conversation and look forward to speaking with you again next year. I, as well as your care team,  appreciate your ongoing commitment to your health goals. Please review the following plan we discussed and let me know if I can assist you in the future. Your Game plan/ To Do List    Referrals: If you haven't heard from the office you've been referred to, please reach out to them at the phone provided.  none Follow up Visits: Next Medicare AWV with our clinical staff: 04/23/2025   Have you seen your provider in the last 6 months (3 months if uncontrolled diabetes)? Yes Next Office Visit with your provider:   Clinician Recommendations:  Aim for 30 minutes of exercise or brisk walking, 6-8 glasses of water , and 5 servings of fruits and vegetables each day.       This is a list of the screening recommended for you and due dates:  Health Maintenance  Topic Date Due   Pneumococcal Vaccine for age over 48 (1 of 1 - PCV) 07/30/2024*   Flu Shot  05/03/2024   DTaP/Tdap/Td vaccine (3 - Td or Tdap) 11/04/2024   Medicare Annual Wellness Visit  04/17/2025   Colon Cancer Screening  07/29/2026   Hepatitis C Screening  Completed   Zoster (Shingles) Vaccine  Completed   Hepatitis B Vaccine  Aged Out   HPV Vaccine  Aged Out   Meningitis B Vaccine  Aged Out   COVID-19 Vaccine  Discontinued  *Topic was postponed. The date shown is not the original due date.    Advanced directives: (Declined) Advance directive discussed with you today. Even though you declined this today, please call our office should you change your mind, and we can give you the proper paperwork for you to fill out. Advance Care Planning is important because it:  [x]  Makes sure you receive the medical care that is consistent with your values, goals, and preferences  [x]  It provides guidance to your family  and loved ones and reduces their decisional burden about whether or not they are making the right decisions based on your wishes.  Follow the link provided in your after visit summary or read over the paperwork we have mailed to you to help you started getting your Advance Directives in place. If you need assistance in completing these, please reach out to us  so that we can help you!  See attachments for Preventive Care and Fall Prevention Tips.

## 2024-07-15 ENCOUNTER — Other Ambulatory Visit: Payer: Self-pay

## 2024-07-15 ENCOUNTER — Ambulatory Visit (INDEPENDENT_AMBULATORY_CARE_PROVIDER_SITE_OTHER): Admitting: Family Medicine

## 2024-07-15 VITALS — BP 130/70 | HR 72 | Ht 71.0 in | Wt 162.0 lb

## 2024-07-15 DIAGNOSIS — M25521 Pain in right elbow: Secondary | ICD-10-CM

## 2024-07-15 NOTE — Progress Notes (Unsigned)
   I, Claretha Schimke am a scribe for Dr. Artist Lloyd, MD.  Bradley Meyer is a 68 y.o. male who presents to [facility] today for right elbow pain. Pt was last seen by Dr. Lloyd on 06/26/23 for left knee pain. Pt received steroid injection, got XR of the left knee, and was referred to PT, completing 10 visits.   Today, patient reports right elbow has been hurting for about 8 months now. Has been trying different things to help the pain but it isn't working. Patient plays tennis about 2 to 3 times a week depending on the season. Wears an arm band brace with any activity, icey hot, massage. Only uses oral medication if is gets bad. Lifting weights causes the pain to flare as well.    Pertinent review of systems: No fevers or chills  Relevant historical information: Inguinal hernia   Exam:  BP 130/70   Pulse 72   Ht 5' 11 (1.803 m)   Wt 162 lb (73.5 kg)   SpO2 98%   BMI 22.59 kg/m  General: Well Developed, well nourished, and in no acute distress.   MSK: Right elbow normal-appearing Normal motion. Intact strength. Tender palpation lateral epicondyle. Pain is reproduced with resisted wrist extension.    Lab and Radiology Results  Diagnostic Limited MSK Ultrasound of: Right elbow lateral epicondyle Avulsion fleck is present at lateral epicondyle consistent with lateral epicondylitis.  No definitive tear is present.  Increased Doppler activity indicates neovascularity and inflammation. Impression: Lateral epicondylitis   Assessment and Plan: 68 y.o. male with right lateral elbow pain due to lateral epicondylitis.  Plan for occupational therapy referral.  Continue home exercise program.  Recommend Voltaren  gel.   PDMP not reviewed this encounter. Orders Placed This Encounter  Procedures   US  LIMITED JOINT SPACE STRUCTURES UP RIGHT(NO LINKED CHARGES)    Reason for Exam (SYMPTOM  OR DIAGNOSIS REQUIRED):   elbow pain    Preferred imaging  location?:   [facility] referral to Occupational Therapy    Referral Priority:   Routine    Referral Type:   Occupational Therapy    Referral Reason:   Specialty Services Required    Requested Specialty:   Occupational Therapy    Number of Visits Requested:   1   No orders of the defined types were placed in this encounter.    Discussed warning signs or symptoms. Please see discharge instructions. Patient expresses understanding.   The above documentation has been reviewed and is accurate and complete Artist Lloyd, M.D.

## 2024-07-15 NOTE — Patient Instructions (Addendum)
 Thank you for coming in today.   Recommend Theraband Flex Bar.   A referral for physical therapy has been submitted. A representative from the physical therapy office will contact you to coordinate scheduling after confirming your benefits with your insurance provider. If you do not hear from the physical therapy office within the next 1-2 weeks, please let us  know.   Please use Voltaren  gel (Generic Diclofenac  Gel) up to 4x daily for pain as needed.  This is available over-the-counter as both the name brand Voltaren  gel and the generic diclofenac  gel.   See you back in 6 weeks.

## 2024-07-25 NOTE — Therapy (Signed)
 OUTPATIENT OCCUPATIONAL THERAPY ORTHO EVALUATION  Patient Name: Bradley Meyer MRN: [id number] DOB:Jun 05, 1956, 68 y.o., male Today's Date: 07/29/2024  PCP: Dr. Amon REFERRING PROVIDER: Dr. Joane  END OF SESSION:  OT End of Session - 07/29/24 1408     Visit Number 1    Number of Visits 17    Date for Recertification  10/07/24    Authorization Type Medicare    Authorization Time Period 10 weeks to account for pt being out of town    Authorization - Visit Number 1    Progress Note Due on Visit 10    OT Start Time 1100    OT Stop Time 1142    OT Time Calculation (min) 42 min          Past Medical History:  Diagnosis Date   BCC (basal cell carcinoma of skin)    nose 2006 and lower left leg   Colon polyp    GERD (gastroesophageal reflux disease)    no meds, diet controlled   History of kidney stones    passed stones   Palpitation    2010, saw [facility] Cardiology (w/u per patient negative)  - Panic Attack   Vitreous detachment 05/2010   Past Surgical History:  Procedure Laterality Date   APPENDECTOMY  1971   CATARACT EXTRACTION Bilateral 10/2021   Dr. Regenia   GAS INSERTION Right 12/22/2022   Procedure: INSERTION OF SF6 GAS;  Surgeon: Valdemar Rogue, MD;  Location: [facility] OR;  Service: Ophthalmology;  Laterality: Right;   KNEE ARTHROSCOPY  2002   right   MEMBRANE PEEL Right 12/22/2022   Procedure: MEMBRANE PEEL;  Surgeon: Valdemar Rogue, MD;  Location: [facility] OR;  Service: Ophthalmology;  Laterality: Right;   NASAL SEPTUM SURGERY  05/2018   septoplasty w/ turb reduction   PARS PLANA VITRECTOMY Right 12/22/2022   Procedure: PARS PLANA VITRECTOMY WITH 25 GAUGE;  Surgeon: Valdemar Rogue, MD;  Location: [facility] OR;  Service: Ophthalmology;  Laterality: Right;   PHOTOCOAGULATION WITH LASER Right 12/22/2022   Procedure: PHOTOCOAGULATION WITH LASER;  Surgeon: Valdemar Rogue, MD;  Location: [facility] OR;  Service: Ophthalmology;  Laterality: Right;   POLYPECTOMY  07/22/2016   colon polyp   UPPER  GI ENDOSCOPY  07/2016   Patient Active Problem List   Diagnosis Date Noted   Left inguinal hernia 09/14/2023   Liver cyst 07/30/2021   Aortic atherosclerosis 11/24/2020   PCP NOTES >>> 07/07/2015   Dysphagia 11/10/2014   BCC (basal cell carcinoma of skin) 07/01/2014   Annual physical exam 02/08/2013    ONSET DATE: 07/15/24  REFERRING DIAG:  Diagnosis  M25.521 (ICD-10-CM) - Right elbow pain   Evaluate and Treat for elbow pain 1-2 times per week for 4-6 weeks.   Decrease pain, increase strength, flexibility, function, and range of motion.  Modalities may include, traction, ionto, phono, stim, and dry needling prn. THERAPY DIAG:  Pain in right elbow  Muscle weakness (generalized)  Rationale for Evaluation and Treatment: Rehabilitation  SUBJECTIVE:   SUBJECTIVE STATEMENT: Pt reports he has had elbow pain for months Pt accompanied by: self  PERTINENT HISTORY: Pt with R lateral epicondylitis PMH: Basal cell carcinoma, dysphagia, aortic atherosclerosis  Per Dr. Joane 07/15/24:Diagn[facility] Ultrasound of: Right elbow lateral epicondyle Avulsion fleck is present at lateral epicondyle consistent with lateral epicondylitis.  No definitive tear is present.  Increased Doppler activity indicates neovascularity and inflammation. Impression: Lateral epicondylitis   PRECAUTIONS: no   WEIGHT BEARING RESTRICTIONS: No  PAIN:  Are  you having pain? Yes: NPRS scale: 3-5/10 Pain location: right elbow Pain description: aching Aggravating factors: overuse, tennis Relieving factors: heat  FALLS: Has patient fallen in last 6 months? No  LIVING ENVIRONMENT:Not assessed   PLOF: Independent  PATIENT GOALS: decrease pain  NEXT MD VISIT: 08/26/24  OBJECTIVE:  Note: Objective measures were completed at Evaluation unless otherwise noted.  HAND DOMINANCE: left, however pt plays tennis right handed  ADLs:Pt reports working out,  playing tennis and using leaf blower  aggravates elbow pain Pt is mod I with all basic ADLS  FUNCTIONAL OUTCOME MEASURES: Quick Dash: 27.5% impairment  UPPER EXTREMITY ROM:   WFLS       HAND FUNCTION: Grip strength: Right:  85 elbow flexion, 75 with elbow extension lbs Left 95 with elbow flexion, 98 lbs with elbow extension     SENSATION:WFL    COGNITION: Overall cognitive status: Within functional limits for tasks assessed   OBSERVATIONS: Pleasant gentleman motivated to improve   TREATMENT DATE: 07/29/24- eval, see pt instructions. Hotpack to right elbow x 5 mins for pain no adverse reactions.                                                                                                                                PATIENT EDUCATION: Education details: role of OT, potential goals, massage to right elbow, phase I triceps stretch, phase II exercises 1, 2 from [employer] Person educated: Patient Education method: Programmer, Multimedia, Demonstration, Verbal cues, and Handouts Education comprehension: verbalized understanding, returned demonstration, and verbal cues required  HOME EXERCISE PROGRAM: massage to right elbow, phase I triceps stretch, phase II exercises 1, 2 from [employer] handbook  GOALS: Goals reviewed with patient? Yes  SHORT TERM GOALS: Target date: 08/28/24  I with inial HEP.  Goal status: INITIAL  2.  I with massage, use of heat and ice for pain management  Goal status: INITIAL  3.  Pt will verbalize understanding of positions to avoid, activity modification and splint wear to minimize symptoms,   Goal status: INITIAL   LONG TERM GOALS: Target date: 10/07/24  I with updated HEP  Goal status: INITIAL  2.  Pt will demonstrate improved RUE functional use as evidenced by improving Quick DASH score to:23% or better  Goal status: INITIAL  3. Pt will report elbow pain no greater than 2/10 with ADL/IADLS and recreational activities.  Goal status:  INITIAL   ASSESSMENT:  CLINICAL IMPRESSION: Patient is a 68 y.o. male who was seen today for occupational therapy evaluation for R lateral epicondylitis. Pt has been experienceing pain for approximately 8 months. He presents with the performance deficits below. He can benefit from skilled occupational therapy to address these deficits in order to maximize pt's safety and I with ADLs/IADLs.  PERFORMANCE DEFICITS: in functional skills including ADLs, IADLs, strength, pain, flexibility, endurance, decreased knowledge of precautions, decreased knowledge of use of DME, and UE functional use, , and psychosocial skills  including coping strategies, environmental adaptation, habits, interpersonal interactions, and routines and behaviors.   IMPAIRMENTS: are limiting patient from ADLs, IADLs, rest and sleep, play, leisure, and social participation.   COMORBIDITIES: may have co-morbidities  that affects occupational performance. Patient will benefit from skilled OT to address above impairments and improve overall function.  MODIFICATION OR ASSISTANCE TO COMPLETE EVALUATION: No modification of tasks or assist necessary to complete an evaluation.  OT OCCUPATIONAL PROFILE AND HISTORY: Detailed assessment: Review of records and additional review of physical, cognitive, psychosocial history related to current functional performance.  CLINICAL DECISION MAKING: LOW - limited treatment options, no task modification necessary  REHAB POTENTIAL: Good  EVALUATION COMPLEXITY: Low      PLAN:  OT FREQUENCY: 1-2x/week  OT DURATION: 10 weeks  PLANNED INTERVENTIONS: 97168 OT Re-evaluation, 97535 self care/ADL training, 02889 therapeutic exercise, 97530 therapeutic activity, 97112 neuromuscular re-education, 97140 manual therapy, 97035 ultrasound, 97018 paraffin, 02989 moist heat, 97010 cryotherapy, 97034 contrast bath, 97033 iontophoresis, 97014 electrical stimulation unattended, 97750 Physical Performance  Testing, 02239 Orthotic Initial, [id number] Orthotic/Prosthetic subsequent, scar mobilization, passive range of motion, energy conservation, coping strategies training, patient/family education, and DME and/or AE instructions  RECOMMENDED OTHER SERVICES: n/a  CONSULTED AND AGREED WITH PLAN OF CARE: Patient  PLAN FOR NEXT SESSION: review Phase II exercises 1, 2 and progress HEP, consider US , iontophoresis once order received   Kempton Milne, OT 07/29/2024, 2:08 PM

## 2024-07-29 ENCOUNTER — Ambulatory Visit: Attending: Internal Medicine | Admitting: Occupational Therapy

## 2024-07-29 DIAGNOSIS — M6281 Muscle weakness (generalized): Secondary | ICD-10-CM | POA: Diagnosis present

## 2024-07-29 DIAGNOSIS — M25521 Pain in right elbow: Secondary | ICD-10-CM | POA: Insufficient documentation

## 2024-08-01 ENCOUNTER — Encounter: Payer: Self-pay | Admitting: Occupational Therapy

## 2024-08-01 ENCOUNTER — Ambulatory Visit: Admitting: Occupational Therapy

## 2024-08-01 DIAGNOSIS — M25521 Pain in right elbow: Secondary | ICD-10-CM

## 2024-08-01 DIAGNOSIS — M6281 Muscle weakness (generalized): Secondary | ICD-10-CM

## 2024-08-01 NOTE — Therapy (Signed)
 OUTPATIENT OCCUPATIONAL THERAPY ORTHO Treatment  Patient Name: Bradley Meyer MRN: [id number] DOB:[birth year], 68 y.o., male Today's Date: 08/01/2024  PCP: Dr. Amon REFERRING PROVIDER: Dr. Joane  END OF SESSION:  OT End of Session - 08/01/24 1020     Visit Number 2    Number of Visits 17    Date for Recertification  10/07/24    Authorization Type Medicare    Authorization Time Period 10 weeks to account for pt being out of town    Authorization - Visit Number 2    Progress Note Due on Visit 10    OT Start Time 1018    OT Stop Time 1058    OT Time Calculation (min) 40 min          Past Medical History:  Diagnosis Date   BCC (basal cell carcinoma of skin)    nose 2006 and lower left leg   Colon polyp    GERD (gastroesophageal reflux disease)    no meds, diet controlled   History of kidney stones    passed stones   Palpitation    2010, saw [facility] Cardiology (w/u per patient negative)  - Panic Attack   Vitreous detachment 05/2010   Past Surgical History:  Procedure Laterality Date   APPENDECTOMY  1971   CATARACT EXTRACTION Bilateral 10/2021   Dr. Regenia   GAS INSERTION Right 12/22/2022   Procedure: INSERTION OF SF6 GAS;  Surgeon: Valdemar Rogue, MD;  Location: [facility] OR;  Service: Ophthalmology;  Laterality: Right;   KNEE ARTHROSCOPY  2002   right   MEMBRANE PEEL Right 12/22/2022   Procedure: MEMBRANE PEEL;  Surgeon: Valdemar Rogue, MD;  Location: [facility] OR;  Service: Ophthalmology;  Laterality: Right;   NASAL SEPTUM SURGERY  05/2018   septoplasty w/ turb reduction   PARS PLANA VITRECTOMY Right 12/22/2022   Procedure: PARS PLANA VITRECTOMY WITH 25 GAUGE;  Surgeon: Valdemar Rogue, MD;  Location: [facility] OR;  Service: Ophthalmology;  Laterality: Right;   PHOTOCOAGULATION WITH LASER Right 12/22/2022   Procedure: PHOTOCOAGULATION WITH LASER;  Surgeon: Valdemar Rogue, MD;  Location: [facility] OR;  Service: Ophthalmology;  Laterality: Right;   POLYPECTOMY  07/22/2016   colon polyp   UPPER  GI ENDOSCOPY  07/2016   Patient Active Problem List   Diagnosis Date Noted   Left inguinal hernia 09/14/2023   Liver cyst 07/30/2021   Aortic atherosclerosis 11/24/2020   PCP NOTES >>> 07/07/2015   Dysphagia 11/10/2014   BCC (basal cell carcinoma of skin) 07/01/2014   Annual physical exam 02/08/2013    ONSET DATE: 07/15/24  REFERRING DIAG:  Diagnosis  M25.521 (ICD-10-CM) - Right elbow pain   Evaluate and Treat for elbow pain 1-2 times per week for 4-6 weeks.   Decrease pain, increase strength, flexibility, function, and range of motion.  Modalities may include, traction, ionto, phono, stim, and dry needling prn. THERAPY DIAG:  Pain in right elbow  Muscle weakness (generalized)  Rationale for Evaluation and Treatment: Rehabilitation  SUBJECTIVE:   SUBJECTIVE STATEMENT: Pt reports his elbow pain has improved since heat and massage Pt accompanied by: self  PERTINENT HISTORY: Pt with R lateral epicondylitis PMH: Basal cell carcinoma, dysphagia, aortic atherosclerosis  Per Dr. Joane 07/15/24:Diagn[facility] Ultrasound of: Right elbow lateral epicondyle Avulsion fleck is present at lateral epicondyle consistent with lateral epicondylitis.  No definitive tear is present.  Increased Doppler activity indicates neovascularity and inflammation. Impression: Lateral epicondylitis   PRECAUTIONS: no   WEIGHT BEARING RESTRICTIONS: No  PAIN:  Are you having pain? Yes: NPRS scale: 1-2/10 Pain location: right elbow Pain description: aching Aggravating factors: overuse, tennis Relieving factors: heat  FALLS: Has patient fallen in last 6 months? No  LIVING ENVIRONMENT:Not assessed   PLOF: Independent  PATIENT GOALS: decrease pain  NEXT MD VISIT: 08/26/24  OBJECTIVE:  Note: Objective measures were completed at Evaluation unless otherwise noted.  HAND DOMINANCE: left, however pt plays tennis right handed  ADLs:Pt reports working out,  playing tennis and using  leaf blower aggravates elbow pain Pt is mod I with all basic ADLS  FUNCTIONAL OUTCOME MEASURES: Quick Dash: 27.5% impairment  UPPER EXTREMITY ROM:   WFLS       HAND FUNCTION: Grip strength: Right:  85 elbow flexion, 75 with elbow extension lbs Left 95 with elbow flexion, 98 lbs with elbow extension     SENSATION:WFL    COGNITION: Overall cognitive status: Within functional limits for tasks assessed   OBSERVATIONS: Pleasant gentleman motivated to improve   TREATMENT DATE:08/01/24- US  , 0.8w/cm 2,  20% x 8 mins no advrese reactions to right elbow and forearm. Massafge to elbow and forearm. Phase I triceps stretch, phase II exercises 1-4 from [employer], then pt progressed to exercises 1-4 for Phase III, 5 reps each min v.c  07/29/24- eval, see pt instructions. Hotpack to right elbow x 5 mins for pain no adverse reactions.                                                                                                                                PATIENT EDUCATION: Education details: Phase I triceps stretch, phase II exercises 1-4 from [employer], then pt progressed to exercises 1-4 for Phase III, 5 reps each min v.c, beginning activity modification and splint wear. Person educated: Patient Education method: Explanation, Demonstration, Verbal cues, and Handouts Education comprehension: verbalized understanding, returned demonstration, and verbal cues required  HOME EXERCISE PROGRAM: massage to right elbow, phase I triceps stretch, phase II exercises 1, 2 from [employer] handbook  GOALS: Goals reviewed with patient? Yes  SHORT TERM GOALS: Target date: 08/28/24  I with inial HEP.  Goal status: met pt demonstrates understanding 08/01/24  2.  I with massage, use of heat and ice for pain management  Goal status: met, 08/01/24, pt reports using at home  3.  Pt will verbalize understanding of positions to avoid, activity modification and splint wear  to minimize symptoms,   Goal status: ongoing 08/01/24, pt is aware of splint recommendations    LONG TERM GOALS: Target date: 10/07/24  I with updated HEP  Goal status: INITIAL  2.  Pt will demonstrate improved RUE functional use as evidenced by improving Quick DASH score to:23% or better  Goal status: INITIAL  3. Pt will report elbow pain no greater than 2/10 with ADL/IADLS and recreational activities.  Goal status: INITIAL   ASSESSMENT:  CLINICAL IMPRESSION: Patient is progressing towards goals. His pain has decreased  significantly since last visit. He demonstrates understanding of HEP. He will be out of town for 2 weeks. PERFORMANCE DEFICITS: in functional skills including ADLs, IADLs, strength, pain, flexibility, endurance, decreased knowledge of precautions, decreased knowledge of use of DME, and UE functional use, , and psychosocial skills including coping strategies, environmental adaptation, habits, interpersonal interactions, and routines and behaviors.   IMPAIRMENTS: are limiting patient from ADLs, IADLs, rest and sleep, play, leisure, and social participation.   COMORBIDITIES: may have co-morbidities  that affects occupational performance. Patient will benefit from skilled OT to address above impairments and improve overall function.  MODIFICATION OR ASSISTANCE TO COMPLETE EVALUATION: No modification of tasks or assist necessary to complete an evaluation.  OT OCCUPATIONAL PROFILE AND HISTORY: Detailed assessment: Review of records and additional review of physical, cognitive, psychosocial history related to current functional performance.  CLINICAL DECISION MAKING: LOW - limited treatment options, no task modification necessary  REHAB POTENTIAL: Good  EVALUATION COMPLEXITY: Low      PLAN:  OT FREQUENCY: 1-2x/week  OT DURATION: 10 weeks  PLANNED INTERVENTIONS: 97168 OT Re-evaluation, 97535 self care/ADL training, 02889 therapeutic exercise, 97530 therapeutic  activity, 97112 neuromuscular re-education, 97140 manual therapy, 97035 ultrasound, 97018 paraffin, 02989 moist heat, 97010 cryotherapy, 97034 contrast bath, 97033 iontophoresis, 97014 electrical stimulation unattended, 97750 Physical Performance Testing, 02239 Orthotic Initial, 97763 Orthotic/Prosthetic subsequent, scar mobilization, passive range of motion, energy conservation, coping strategies training, patient/family education, and DME and/or AE instructions  RECOMMENDED OTHER SERVICES: n/a  CONSULTED AND AGREED WITH PLAN OF CARE: Patient  PLAN FOR NEXT SESSION: pt to be out of town x 2 review and progress exercises, iontophoresis if pt is still having pain   Vedansh Kerstetter, OT 08/01/2024, 10:38 AM

## 2024-08-19 ENCOUNTER — Ambulatory Visit: Attending: Internal Medicine | Admitting: Occupational Therapy

## 2024-08-19 DIAGNOSIS — M25521 Pain in right elbow: Secondary | ICD-10-CM | POA: Insufficient documentation

## 2024-08-19 DIAGNOSIS — M6281 Muscle weakness (generalized): Secondary | ICD-10-CM | POA: Diagnosis present

## 2024-08-19 NOTE — Therapy (Signed)
 OUTPATIENT OCCUPATIONAL THERAPY ORTHO Treatment  Patient Name: Bradley Meyer MRN: [id number] DOB:1956/09/19, 68 y.o., male Today's Date: 08/19/2024  PCP: Dr. Amon REFERRING PROVIDER: Dr. Joane  END OF SESSION:  OT End of Session - 08/19/24 1108     Visit Number 3    Number of Visits 17    Date for Recertification  10/07/24    Authorization Type Medicare    Authorization Time Period 10 weeks to account for pt being out of town    Progress Note Due on Visit 10    OT Start Time 1055    OT Stop Time 1145    OT Time Calculation (min) 50 min    Activity Tolerance Patient tolerated treatment well    Behavior During Therapy WFL for tasks assessed/performed           Past Medical History:  Diagnosis Date   BCC (basal cell carcinoma of skin)    nose 2006 and lower left leg   Colon polyp    GERD (gastroesophageal reflux disease)    no meds, diet controlled   History of kidney stones    passed stones   Palpitation    2010, saw [facility] (w/u per patient negative)  - Panic Attack   Vitreous detachment 05/2010   Past Surgical History:  Procedure Laterality Date   APPENDECTOMY  1971   CATARACT EXTRACTION Bilateral 10/2021   Dr. Regenia   GAS INSERTION Right 12/22/2022   Procedure: INSERTION OF SF6 GAS;  Surgeon: Valdemar Rogue, MD;  Location: [facility] OR;  Service: Ophthalmology;  Laterality: Right;   KNEE ARTHROSCOPY  2002   right   MEMBRANE PEEL Right 12/22/2022   Procedure: MEMBRANE PEEL;  Surgeon: Valdemar Rogue, MD;  Location: [facility] OR;  Service: Ophthalmology;  Laterality: Right;   NASAL SEPTUM SURGERY  05/2018   septoplasty w/ turb reduction   PARS PLANA VITRECTOMY Right 12/22/2022   Procedure: PARS PLANA VITRECTOMY WITH 25 GAUGE;  Surgeon: Valdemar Rogue, MD;  Location: [facility] OR;  Service: Ophthalmology;  Laterality: Right;   PHOTOCOAGULATION WITH LASER Right 12/22/2022   Procedure: PHOTOCOAGULATION WITH LASER;  Surgeon: Valdemar Rogue, MD;  Location: [facility] OR;  Service:  Ophthalmology;  Laterality: Right;   POLYPECTOMY  07/22/2016   colon polyp   UPPER GI ENDOSCOPY  07/2016   Patient Active Problem List   Diagnosis Date Noted   Left inguinal hernia 09/14/2023   Liver cyst 07/30/2021   Aortic atherosclerosis 11/24/2020   PCP NOTES >>> 07/07/2015   Dysphagia 11/10/2014   BCC (basal cell carcinoma of skin) 07/01/2014   Annual physical exam 02/08/2013    ONSET DATE: 07/15/24  REFERRING DIAG:  Diagnosis  M25.521 (ICD-10-CM) - Right elbow pain   Evaluate and Treat for elbow pain 1-2 times per week for 4-6 weeks.   Decrease pain, increase strength, flexibility, function, and range of motion.  Modalities may include, traction, ionto, phono, stim, and dry needling prn. THERAPY DIAG:  Pain in right elbow  Rationale for Evaluation and Treatment: Rehabilitation  SUBJECTIVE:   SUBJECTIVE STATEMENT: Pt reports only mild elbow pain Pt accompanied by: self  PERTINENT HISTORY: Pt with R lateral epicondylitis PMH: Basal cell carcinoma, dysphagia, aortic atherosclerosis  Per Dr. Joane 07/15/24:[facility] Ultrasound of: Right elbow lateral epicondyle Avulsion fleck is present at lateral epicondyle consistent with lateral epicondylitis.  No definitive tear is present.  Increased Doppler activity indicates neovascularity and inflammation. Impression: Lateral epicondylitis   PRECAUTIONS: no   WEIGHT BEARING RESTRICTIONS:  No  PAIN:  Are you having pain? Yes: NPRS scale: 1-3/10 Pain location: right elbow Pain description: aching Aggravating factors: overuse, tennis Relieving factors: heat  FALLS: Has patient fallen in last 6 months? No  LIVING ENVIRONMENT:Not assessed   PLOF: Independent  PATIENT GOALS: decrease pain  NEXT MD VISIT: 08/26/24  OBJECTIVE:  Note: Objective measures were completed at Evaluation unless otherwise noted.  HAND DOMINANCE: left, however pt plays tennis right handed  ADLs:Pt reports working out,   playing tennis and using leaf blower aggravates elbow pain Pt is mod I with all basic ADLS  FUNCTIONAL OUTCOME MEASURES: Quick Dash: 27.5% impairment  UPPER EXTREMITY ROM:   WFLS       HAND FUNCTION: Grip strength: Right:  85 elbow flexion, 75 with elbow extension lbs Left 95 with elbow flexion, 98 lbs with elbow extension     SENSATION:WFL    COGNITION: Overall cognitive status: Within functional limits for tasks assessed   OBSERVATIONS: Pleasant gentleman motivated to improve   TREATMENT DATE:08/19/24-US  3.59mhz, 0.8w/cm 2,  continuous x 8 mins no adverse reactions to right elbow and forearm.Pt perfromed 5 reps exercise #4 from phase 2, then exercises 1-4 for Phase III, 5 reps each min v.c Pt progressed to exercises 1, 3 from phase IV, [place] protocol, 10 reps each with 1 lbs weight, min v.c Pt developed increased pain so strengthening exercises were discontinued. Massage to right elbow and forearm followed by ice x 5 mins for pain , no adverse reactions. Pt reports he has a red flex bar ar home, he perfomed wrist flexion/ extension with RUE and was instructed to try 1x day x 10 reps but to discontinue if increased pain. Pt verbalized understanding . 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 Therapist recommended pt discusses his lateral epicondylitis with trainer and that he avoids exercises with elbows locked in extension. Pt reports he is wearing wrist brace and counterforce brace at gym. Therapist offered iontophoresis today, however after pt read precutions he decided he did not wish to pursue.   08/01/24- US  , 0.8w/cm 2,  20% x 8 mins no advrese reactions to right elbow and forearm.  Massage to elbow and forearm. Phase I triceps stretch, phase II exercises 1-4 from [employer], then pt progressed to exercises 1-4 for Phase III, 5 reps each min v.c  07/29/24- eval, see pt instructions. Hotpack to right elbow x 5 mins for pain no adverse  reactions.                                                                                                                                PATIENT EDUCATION: Education details: reveiwed previous exercises and discussed using caution at the gym to avoid increaing pain Person educated: Patient Education method: Explanation, Demonstration, Verbal cues,  Education comprehension: verbalized understanding, returned demonstration, and verbal cues required  HOME EXERCISE PROGRAM: massage to right elbow, phase I triceps stretch,  phase II exercises 1, 2 from [place] handbook  GOALS: Goals reviewed with patient? Yes  SHORT TERM GOALS: Target date: 08/28/24  I with inial HEP.  Goal status: met pt demonstrates understanding 08/01/24  2.  I with massage, use of heat and ice for pain management  Goal status: met, 08/01/24, pt reports using at home  3.  Pt will verbalize understanding of positions to avoid, activity modification and splint wear to minimize symptoms,   Goal status: ongoing 08/19/24 pt is aware of splint recommendations , therpist continues to reinforce activity modifications   LONG TERM GOALS: Target date: 10/07/24  I with updated HEP  Goal status: INITIAL  2.  Pt will demonstrate improved RUE functional use as evidenced by improving Quick DASH score to:23% or better  Goal status: INITIAL  3. Pt will report elbow pain no greater than 2/10 with ADL/IADLS and recreational activities.  Goal status: INITIAL   ASSESSMENT:  CLINICAL IMPRESSION: Patient is progressing towards goals. His pain remains about the same overall at 1-2/10 on arriveal. Pt continues to perfrom stretching activities yet he is unable to progress to pahse IV exercises due to pain.PERFORMANCE DEFICITS: in functional skills including ADLs, IADLs, strength, pain, flexibility, endurance, decreased knowledge of precautions, decreased knowledge of use of DME, and UE functional use, , and psychosocial skills  including coping strategies, environmental adaptation, habits, interpersonal interactions, and routines and behaviors.   IMPAIRMENTS: are limiting patient from ADLs, IADLs, rest and sleep, play, leisure, and social participation.   COMORBIDITIES: may have co-morbidities  that affects occupational performance. Patient will benefit from skilled OT to address above impairments and improve overall function.  MODIFICATION OR ASSISTANCE TO COMPLETE EVALUATION: No modification of tasks or assist necessary to complete an evaluation.  OT OCCUPATIONAL PROFILE AND HISTORY: Detailed assessment: Review of records and additional review of physical, cognitive, psychosocial history related to current functional performance.  CLINICAL DECISION MAKING: LOW - limited treatment options, no task modification necessary  REHAB POTENTIAL: Good  EVALUATION COMPLEXITY: Low      PLAN:  OT FREQUENCY: 1-2x/week  OT DURATION: 10 weeks  PLANNED INTERVENTIONS: 97168 OT Re-evaluation, 97535 self care/ADL training, 02889 therapeutic exercise, 97530 therapeutic activity, 97112 neuromuscular re-education, 97140 manual therapy, 97035 ultrasound, 97018 paraffin, 02989 moist heat, 97010 cryotherapy, 97034 contrast bath, 97033 iontophoresis, 97014 electrical stimulation unattended, 97750 Physical Performance Testing, 02239 Orthotic Initial, [id number] Orthotic/Prosthetic subsequent, scar mobilization, passive range of motion, energy conservation, coping strategies training, patient/family education, and DME and/or AE instructions  RECOMMENDED OTHER SERVICES: n/a  CONSULTED AND AGREED WITH PLAN OF CARE: Patient  PLAN FOR NEXT SESSION: progress HEP as able, US , consider kinesiotape   Kenniya Westrich, OT 08/19/2024, 11:13 AM

## 2024-08-19 NOTE — Patient Instructions (Signed)

## 2024-08-22 ENCOUNTER — Encounter: Payer: Self-pay | Admitting: Occupational Therapy

## 2024-08-22 ENCOUNTER — Ambulatory Visit: Admitting: Occupational Therapy

## 2024-08-22 DIAGNOSIS — M25521 Pain in right elbow: Secondary | ICD-10-CM

## 2024-08-22 DIAGNOSIS — M6281 Muscle weakness (generalized): Secondary | ICD-10-CM

## 2024-08-22 NOTE — Therapy (Signed)
 OUTPATIENT OCCUPATIONAL THERAPY ORTHO Treatment  Patient Name: Bradley Meyer MRN: [id number] DOB:Jun 29, 1956, 68 y.o., male Today's Date: 08/22/2024  PCP: Dr. Amon REFERRING PROVIDER: Dr. Joane  END OF SESSION:     Past Medical History:  Diagnosis Date   BCC (basal cell carcinoma of skin)    nose 2006 and lower left leg   Colon polyp    GERD (gastroesophageal reflux disease)    no meds, diet controlled   History of kidney stones    passed stones   Palpitation    2010, saw [facility] (w/u per patient negative)  - Panic Attack   Vitreous detachment 05/2010   Past Surgical History:  Procedure Laterality Date   APPENDECTOMY  1971   CATARACT EXTRACTION Bilateral 10/2021   Dr. Regenia   GAS INSERTION Right 12/22/2022   Procedure: INSERTION OF SF6 GAS;  Surgeon: Valdemar Rogue, MD;  Location: [facility] OR;  Service: Ophthalmology;  Laterality: Right;   KNEE ARTHROSCOPY  2002   right   MEMBRANE PEEL Right 12/22/2022   Procedure: MEMBRANE PEEL;  Surgeon: Valdemar Rogue, MD;  Location: [facility] OR;  Service: Ophthalmology;  Laterality: Right;   NASAL SEPTUM SURGERY  05/2018   septoplasty w/ turb reduction   PARS PLANA VITRECTOMY Right 12/22/2022   Procedure: PARS PLANA VITRECTOMY WITH 25 GAUGE;  Surgeon: Valdemar Rogue, MD;  Location: [facility] OR;  Service: Ophthalmology;  Laterality: Right;   PHOTOCOAGULATION WITH LASER Right 12/22/2022   Procedure: PHOTOCOAGULATION WITH LASER;  Surgeon: Valdemar Rogue, MD;  Location: [facility] OR;  Service: Ophthalmology;  Laterality: Right;   POLYPECTOMY  07/22/2016   colon polyp   UPPER GI ENDOSCOPY  07/2016   Patient Active Problem List   Diagnosis Date Noted   Left inguinal hernia 09/14/2023   Liver cyst 07/30/2021   Aortic atherosclerosis 11/24/2020   PCP NOTES >>> 07/07/2015   Dysphagia 11/10/2014   BCC (basal cell carcinoma of skin) 07/01/2014   Annual physical exam 02/08/2013    ONSET DATE: 07/15/24  REFERRING DIAG:  Diagnosis  M25.521  (ICD-10-CM) - Right elbow pain   Evaluate and Treat for elbow pain 1-2 times per week for 4-6 weeks.   Decrease pain, increase strength, flexibility, function, and range of motion.  Modalities may include, traction, ionto, phono, stim, and dry needling prn. THERAPY DIAG:  Pain in right elbow  Muscle weakness (generalized)  Rationale for Evaluation and Treatment: Rehabilitation  SUBJECTIVE:   SUBJECTIVE STATEMENT: Pt reports only mild elbow pain Pt accompanied by: self  PERTINENT HISTORY: Pt with R lateral epicondylitis PMH: Basal cell carcinoma, dysphagia, aortic atherosclerosis  Per Dr. Joane 07/15/24:[facility] Ultrasound of: Right elbow lateral epicondyle Avulsion fleck is present at lateral epicondyle consistent with lateral epicondylitis.  No definitive tear is present.  Increased Doppler activity indicates neovascularity and inflammation. Impression: Lateral epicondylitis   PRECAUTIONS: no   WEIGHT BEARING RESTRICTIONS: No  PAIN:  Are you having pain? Yes: NPRS scale: 1-3/10 Pain location: right elbow Pain description: aching Aggravating factors: overuse, tennis Relieving factors: heat  FALLS: Has patient fallen in last 6 months? No  LIVING ENVIRONMENT:Not assessed   PLOF: Independent  PATIENT GOALS: decrease pain  NEXT MD VISIT: 08/26/24  OBJECTIVE:  Note: Objective measures were completed at Evaluation unless otherwise noted.  HAND DOMINANCE: left, however pt plays tennis right handed  ADLs:Pt reports working out,  playing tennis and using leaf blower aggravates elbow pain Pt is mod I with all basic ADLS  FUNCTIONAL OUTCOME MEASURES: Quick  Dash: 27.5% impairment  UPPER EXTREMITY ROM:   WFLS       HAND FUNCTION: Grip strength: Right:  85 elbow flexion, 75 with elbow extension lbs Left 95 with elbow flexion, 98 lbs with elbow extension     SENSATION:WFL    COGNITION: Overall cognitive status: Within functional limits for  tasks assessed   OBSERVATIONS: Pleasant gentleman motivated to improve   TREATMENT DATE: 08/22/24-US  3.41mhz, 0.8w/cm 2,  continuous x 8 mins no adverse reactions to right elbow and forearm. Massage to right forearm and elbow Pt performed triceps stretch then 5 reps exercise #4 from phase 2, then exercises 1-4 for Phase III, 5 reps each min v.c Pt was intructed in the [employer] and reverse tyler twist with the red flex bar 10 reps each, min v.c and demonstration. Ice pack to R elbow x 5 mins while discussing exercise and aoiding increasing pain greater than 3 points above resiting pain with actiivty due to risk for reinjuring. Therapist also reocmmended pt continues with counterforce brace and wrist brace prn, No adverse reactions to ice.  08/19/24-US  3.10mhz, 0.8w/cm 2,  continuous x 8 mins no adverse reactions to right elbow and forearm.Pt perfromed 5 reps exercise #4 from phase 2, then exercises 1-4 for Phase III, 5 reps each min v.c Pt progressed to exercises 1, 3 from phase IV, [place] protocol, 10 reps each with 1 lbs weight, min v.c Pt developed increased pain so strengthening exercises were discontinued. Massage to right elbow and forearm followed by ice x 5 mins for pain , no adverse reactions. Pt reports he has a red flex bar ar home, he perfomed wrist flexion/ extension with RUE and was instructed to try 1x day x 10 reps but to discontinue if increased pain. Pt verbalized understanding . 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 Therapist recommended pt discusses his lateral epicondylitis with trainer and that he avoids exercises with elbows locked in extension. Pt reports he is wearing wrist brace and counterforce brace at gym. Therapist offered iontophoresis today, however after pt read precutions he decided he did not wish to pursue.   08/01/24- US  , 0.8w/cm 2,  20% x 8 mins no advrese reactions to right elbow and forearm.  Massage to elbow and forearm. Phase I  triceps stretch, phase II exercises 1-4 from [employer], then pt progressed to exercises 1-4 for Phase III, 5 reps each min v.c  07/29/24- eval, see pt instructions. Hotpack to right elbow x 5 mins for pain no adverse reactions.                                                                                                                                PATIENT EDUCATION: Education details: reveiwed previous exercises, added tyler twist and reverse tyler twist Person educated: Patient Education method: Explanation, Demonstration, Verbal cues, handout Education comprehension: verbalized understanding, returned demonstration, and verbal cues required  HOME EXERCISE PROGRAM:  massage to right elbow, phase I triceps stretch, phase II exercises 1, 2 from [place] handbook tyler twist/ reverse tyler twist GOALS: Goals reviewed with patient? Yes  SHORT TERM GOALS: Target date: 08/28/24  I with inial HEP.  Goal status: met pt demonstrates understanding 08/01/24  2.  I with massage, use of heat and ice for pain management  Goal status: met, 08/01/24, pt reports using at home  3.  Pt will verbalize understanding of positions to avoid, activity modification and splint wear to minimize symptoms,   Goal status: ongoing 11/120/25 pt is aware of splint recommendations , therapist continues to reinforce activity modifications   LONG TERM GOALS: Target date: 10/07/24  I with updated HEP  Goal status: INITIAL  2.  Pt will demonstrate improved RUE functional use as evidenced by improving Quick DASH score to:23% or better  Goal status: INITIAL  3. Pt will report elbow pain no greater than 2/10 with ADL/IADLS and recreational activities.  Goal status: INITIAL   ASSESSMENT:  CLINICAL IMPRESSION: Patient is progressing towards goals. He reports his elbow is feeling a lot better. He demonstrates understanding of updated exercises.SABRAPERFORMANCE DEFICITS: in functional skills including  ADLs, IADLs, strength, pain, flexibility, endurance, decreased knowledge of precautions, decreased knowledge of use of DME, and UE functional use, , and psychosocial skills including coping strategies, environmental adaptation, habits, interpersonal interactions, and routines and behaviors.   IMPAIRMENTS: are limiting patient from ADLs, IADLs, rest and sleep, play, leisure, and social participation.   COMORBIDITIES: may have co-morbidities  that affects occupational performance. Patient will benefit from skilled OT to address above impairments and improve overall function.  MODIFICATION OR ASSISTANCE TO COMPLETE EVALUATION: No modification of tasks or assist necessary to complete an evaluation.  OT OCCUPATIONAL PROFILE AND HISTORY: Detailed assessment: Review of records and additional review of physical, cognitive, psychosocial history related to current functional performance.  CLINICAL DECISION MAKING: LOW - limited treatment options, no task modification necessary  REHAB POTENTIAL: Good  EVALUATION COMPLEXITY: Low      PLAN:  OT FREQUENCY: 1-2x/week  OT DURATION: 10 weeks  PLANNED INTERVENTIONS: 97168 OT Re-evaluation, 97535 self care/ADL training, 02889 therapeutic exercise, 97530 therapeutic activity, 97112 neuromuscular re-education, 97140 manual therapy, 97035 ultrasound, 97018 paraffin, 02989 moist heat, 97010 cryotherapy, 97034 contrast bath, 97033 iontophoresis, 97014 electrical stimulation unattended, 97750 Physical Performance Testing, 02239 Orthotic Initial, [id number] Orthotic/Prosthetic subsequent, scar mobilization, passive range of motion, energy conservation, coping strategies training, patient/family education, and DME and/or AE instructions  RECOMMENDED OTHER SERVICES: n/a  CONSULTED AND AGREED WITH PLAN OF CARE: Patient  PLAN FOR NEXT SESSION: progress HEP as able, US , consider kinesiotape   Beldon Nowling, OT 08/22/2024, 11:49 AM

## 2024-08-26 ENCOUNTER — Ambulatory Visit: Admitting: Family Medicine

## 2024-08-27 ENCOUNTER — Ambulatory Visit: Admitting: Occupational Therapy

## 2024-09-06 ENCOUNTER — Ambulatory Visit: Admitting: Occupational Therapy

## 2024-10-08 ENCOUNTER — Ambulatory Visit: Admitting: Internal Medicine

## 2024-10-08 ENCOUNTER — Encounter: Payer: Self-pay | Admitting: Internal Medicine

## 2024-10-08 VITALS — BP 124/70 | HR 78 | Temp 98.2°F | Resp 16 | Ht 71.0 in | Wt 164.4 lb

## 2024-10-08 DIAGNOSIS — R42 Dizziness and giddiness: Secondary | ICD-10-CM

## 2024-10-08 DIAGNOSIS — Z8042 Family history of malignant neoplasm of prostate: Secondary | ICD-10-CM | POA: Diagnosis not present

## 2024-10-08 DIAGNOSIS — Z9189 Other specified personal risk factors, not elsewhere classified: Secondary | ICD-10-CM

## 2024-10-08 DIAGNOSIS — R739 Hyperglycemia, unspecified: Secondary | ICD-10-CM

## 2024-10-08 DIAGNOSIS — I700 Atherosclerosis of aorta: Secondary | ICD-10-CM

## 2024-10-08 DIAGNOSIS — R399 Unspecified symptoms and signs involving the genitourinary system: Secondary | ICD-10-CM

## 2024-10-08 DIAGNOSIS — E569 Vitamin deficiency, unspecified: Secondary | ICD-10-CM

## 2024-10-08 LAB — URINALYSIS, ROUTINE W REFLEX MICROSCOPIC
Bilirubin Urine: NEGATIVE
Hgb urine dipstick: NEGATIVE
Ketones, ur: NEGATIVE
Leukocytes,Ua: NEGATIVE
Nitrite: NEGATIVE
RBC / HPF: NONE SEEN
Specific Gravity, Urine: 1.025 (ref 1.000–1.030)
Total Protein, Urine: NEGATIVE
Urine Glucose: NEGATIVE
Urobilinogen, UA: 0.2 (ref 0.0–1.0)
WBC, UA: NONE SEEN
pH: 6 (ref 5.0–8.0)

## 2024-10-08 LAB — CBC WITH DIFFERENTIAL/PLATELET
Basophils Absolute: 0 K/uL (ref 0.0–0.1)
Basophils Relative: 1 % (ref 0.0–3.0)
Eosinophils Absolute: 0.1 K/uL (ref 0.0–0.7)
Eosinophils Relative: 2 % (ref 0.0–5.0)
HCT: 42.7 % (ref 39.0–52.0)
Hemoglobin: 14 g/dL (ref 13.0–17.0)
Lymphocytes Relative: 29.1 % (ref 12.0–46.0)
Lymphs Abs: 1.3 K/uL (ref 0.7–4.0)
MCHC: 32.9 g/dL (ref 30.0–36.0)
MCV: 92.9 fl (ref 78.0–100.0)
Monocytes Absolute: 0.5 K/uL (ref 0.1–1.0)
Monocytes Relative: 10.9 % (ref 3.0–12.0)
Neutro Abs: 2.5 K/uL (ref 1.4–7.7)
Neutrophils Relative %: 57 % (ref 43.0–77.0)
Platelets: 194 K/uL (ref 150.0–400.0)
RBC: 4.6 Mil/uL (ref 4.22–5.81)
RDW: 13.1 % (ref 11.5–15.5)
WBC: 4.5 K/uL (ref 4.0–10.5)

## 2024-10-08 LAB — COMPREHENSIVE METABOLIC PANEL WITH GFR
ALT: 18 U/L (ref 3–53)
AST: 21 U/L (ref 5–37)
Albumin: 4.2 g/dL (ref 3.5–5.2)
Alkaline Phosphatase: 98 U/L (ref 39–117)
BUN: 16 mg/dL (ref 6–23)
CO2: 28 meq/L (ref 19–32)
Calcium: 9 mg/dL (ref 8.4–10.5)
Chloride: 105 meq/L (ref 96–112)
Creatinine, Ser: 1.23 mg/dL (ref 0.40–1.50)
GFR: 61.18 mL/min
Glucose, Bld: 93 mg/dL (ref 70–99)
Potassium: 4.6 meq/L (ref 3.5–5.1)
Sodium: 139 meq/L (ref 135–145)
Total Bilirubin: 0.8 mg/dL (ref 0.2–1.2)
Total Protein: 6.2 g/dL (ref 6.0–8.3)

## 2024-10-08 LAB — TESTOSTERONE: Testosterone: 574.01 ng/dL (ref 300.00–890.00)

## 2024-10-08 LAB — LIPID PANEL
Cholesterol: 170 mg/dL (ref 28–200)
HDL: 64.8 mg/dL
LDL Cholesterol: 93 mg/dL (ref 10–99)
NonHDL: 105.28
Total CHOL/HDL Ratio: 3
Triglycerides: 61 mg/dL (ref 10.0–149.0)
VLDL: 12.2 mg/dL (ref 0.0–40.0)

## 2024-10-08 LAB — VITAMIN B12: Vitamin B-12: 230 pg/mL (ref 211–911)

## 2024-10-08 LAB — VITAMIN D 25 HYDROXY (VIT D DEFICIENCY, FRACTURES): VITD: 22.33 ng/mL — ABNORMAL LOW (ref 30.00–100.00)

## 2024-10-08 LAB — PSA: PSA: 2.57 ng/mL (ref 0.10–4.00)

## 2024-10-08 NOTE — Progress Notes (Unsigned)
 "  Subjective:    Patient ID: Bradley Meyer, male    DOB: 1956/01/30, 69 y.o.   MRN: [id number]  DOS:  10/08/2024 Routine checkup  Discussed the use of AI scribe software for clinical note transcription with the patient, who gave verbal consent to proceed.  History of Present Illness Bradley Meyer is a 69 year old male who presents for a routine checkup.  Chronic gastrointestinal symptoms - Chronic gas and loose stools for approximately ten years - No associated fever, chills, nausea, vomiting, or blood in the stool  Exertional lightheadedness - Lightheadedness during workouts occurring every 2 to 3 weeks - Episodes last about one hour - Triggered by standing up quickly and worsened by caffeine - Systolic blood pressure during episodes is approximately 100 mmHg when checked at the gym - No dizziness, chest pain, palpitations, double vision, or slurred speech during episodes  Lower urinary tract symptoms - Post-void dribbling present - No dysuria, hematuria, or urinary difficulty  Review of Systems  Other than above, a 14 point review of systems is negative   Past Medical History:  Diagnosis Date   BCC (basal cell carcinoma of skin)    nose 2006 and lower left leg   Colon polyp    GERD (gastroesophageal reflux disease)    no meds, diet controlled   History of kidney stones    passed stones   Palpitation    2010, saw [facility] (w/u per patient negative)  - Panic Attack   Vitreous detachment 05/2010    Past Surgical History:  Procedure Laterality Date   APPENDECTOMY  1971   CATARACT EXTRACTION Bilateral 10/2021   Dr. Regenia   GAS INSERTION Right 12/22/2022   Procedure: INSERTION OF SF6 GAS;  Surgeon: Valdemar Rogue, MD;  Location: [facility] OR;  Service: Ophthalmology;  Laterality: Right;   KNEE ARTHROSCOPY  2002   right   MEMBRANE PEEL Right 12/22/2022   Procedure: MEMBRANE PEEL;  Surgeon: Valdemar Rogue, MD;  Location: [facility] OR;  Service: Ophthalmology;   Laterality: Right;   NASAL SEPTUM SURGERY  05/2018   septoplasty w/ turb reduction   PARS PLANA VITRECTOMY Right 12/22/2022   Procedure: PARS PLANA VITRECTOMY WITH 25 GAUGE;  Surgeon: Valdemar Rogue, MD;  Location: [facility] OR;  Service: Ophthalmology;  Laterality: Right;   PHOTOCOAGULATION WITH LASER Right 12/22/2022   Procedure: PHOTOCOAGULATION WITH LASER;  Surgeon: Valdemar Rogue, MD;  Location: [facility] OR;  Service: Ophthalmology;  Laterality: Right;   POLYPECTOMY  07/22/2016   colon polyp   UPPER GI ENDOSCOPY  07/2016    No current outpatient medications     Objective:   Physical Exam BP 124/70   Pulse 78   Temp 98.2 F (36.8 C) (Oral)   Resp 16   Ht 5' 11 (1.803 m)   Wt 164 lb 6 oz (74.6 kg)   SpO2 98%   BMI 22.93 kg/m  General: Well developed, NAD, BMI noted Neck: No  thyromegaly  HEENT:  Normocephalic . Face symmetric, atraumatic. Neck: Normal carotid pulses Lungs:  CTA B Normal respiratory effort, no intercostal retractions, no accessory muscle use. Heart: RRR,  no murmur.  Abdomen:  Not distended, soft, non-tender. No rebound or rigidity.   Lower extremities: no pretibial edema bilaterally  Skin: Exposed areas without rash. Not pale. Not jaundice Neurologic:  alert & oriented X3.  Speech normal, gait appropriate for age and unassisted Strength symmetric and appropriate for age.  Psych: Cognition and judgment appear intact.  Cooperative with  normal attention span and concentration.  Behavior appropriate. No anxious or depressed appearing.     Assessment    Assessment BCC nose 2016, sees derm q year L Vitreous detachment 2011 R vitreous  detachment and retinal tear 04-2022  Chronic abdominal pain: 2017- saw GI >>  EGD, Cscope, CT unrevealing. Sx resolved  w/h gluten-free diet Chronic diarrhea: Saw GI 2021 W/U-. Vegetarian  Liver cysts-- see OV 07-28-2021 Tinnitus, SNHL, ENT OV 11/2023 H/o Anxiety H/o Palpitations 2010, saw cards, workup (-) per  pt  Assessment & Plan Preventive care reviewed -Td 2016 - s/p  shingrix  - Vaccine advice: Flu, PNM 20, COVID-vaccine. pros>cons, declines  -CCS[place] biopsies were normal, terminal ileum was normal appearing and normal by biopsy, 10mm pedunculated TVA was removed from rectum Colonoscopy 07/2011--- next 5 years cscope 07-2016 (-), 10 years Prostate cancer screening:   F dx w/ prostate cancer age 72, died at age 47.  Occasional LUTS.  Check UA urine culture and PSA  Other issues  Lightheadedness Intermittent episodes linked to exercise and caffeine, with occasional low blood pressure.  EKG today normal, no red flag symptoms. - Recommended observation and blood pressure monitoring. - Advised good hydration. - Instructed to report consistently low blood pressure readings. LUTS Minimal symptoms , post void dribbling, unlikely to be d/t underlying serious issue. Checking labs  Elevated cardiovascular risk Cardiovascular risk at ~ 10%.  Rechecked cholesterol panel although he said he will not take the statins. Other labs: Likes to check vitamin D , B12 and testosterone .  He is aware he is not likely covered by medicare. RTC 6 months   "

## 2024-10-08 NOTE — Patient Instructions (Addendum)
 THE FOLLOWING IS A SUMMARY OF YOUR INSTRUCTION PLEASE REVIEW THEM BEFORE YOU LEAVE THE OFFICE AND LET US  KNOW IF YOU HAVE QUESTIONS    GO TO THE LAB :  Get the blood work    Go to the front desk for the checkout Please make an appointment for a checkup in 6 months   Continue taking your blood pressure regularly Blood pressure goal:  between 110/65 and  130/80. If it is consistently higher or lower, let me know      LIGHTHEADEDNESS:  You experience lightheadedness during workouts, which is sometimes linked to low blood pressure and caffeine intake. -Monitor your blood pressure regularly, especially during workouts. -Stay well-hydrated. -Report any consistently low blood pressure readings to us .  LOWER URINARY TRACT SYMPTOMS: You have minimal urinary symptoms, will check a urinalysis PSA.   ELEVATED CARDIOVASCULAR RISK: Your cardiovascular risk is elevated at 10.3% -We rechecked your cholesterol panel today. -Your next visit is scheduled in six months.

## 2024-10-09 ENCOUNTER — Encounter: Payer: Self-pay | Admitting: Internal Medicine

## 2024-10-09 LAB — URINE CULTURE
MICRO NUMBER:: 17431618
Result:: NO GROWTH
SPECIMEN QUALITY:: ADEQUATE

## 2024-10-09 NOTE — Assessment & Plan Note (Signed)
 Preventive care reviewed   Other issues  Lightheadedness Intermittent episodes linked to exercise and caffeine, with occasional low blood pressure.  EKG today normal, no red flag symptoms. - Recommended observation and blood pressure monitoring. - Advised good hydration. - Instructed to report consistently low blood pressure readings. LUTS Minimal symptoms , post void dribbling, unlikely to be d/t underlying serious issue. Checking labs  Elevated cardiovascular risk Cardiovascular risk at ~ 10%.  Rechecked cholesterol panel although he said he will not take the statins. Other labs: Likes to check vitamin D , B12 and testosterone .  He is aware he is not likely covered by medicare. RTC 6 months

## 2024-10-09 NOTE — Assessment & Plan Note (Signed)
 Preventive care reviewed -Td 2016 - s/p  shingrix  - Vaccine advice: Flu, PNM 20, COVID-vaccine. pros>cons, declines  -CCS: 05/[place]:random biopsies were normal, terminal ileum was normal appearing and normal by biopsy, 10mm pedunculated TVA was removed from rectum Colonoscopy 07/2011--- next 5 years cscope 07-2016 (-), 10 years Prostate cancer screening:   F dx w/ prostate cancer age 69, died at age 53.  Occasional LUTS.  Check UA urine culture and PSA

## 2024-10-10 ENCOUNTER — Ambulatory Visit: Payer: Self-pay | Admitting: Internal Medicine

## 2024-11-05 ENCOUNTER — Ambulatory Visit: Admitting: Internal Medicine

## 2025-02-19 ENCOUNTER — Encounter (INDEPENDENT_AMBULATORY_CARE_PROVIDER_SITE_OTHER): Admitting: Ophthalmology

## 2025-04-07 ENCOUNTER — Ambulatory Visit: Admitting: Internal Medicine

## 2025-04-23 ENCOUNTER — Ambulatory Visit

## 2025-10-10 ENCOUNTER — Ambulatory Visit: Admitting: Internal Medicine
